# Patient Record
Sex: Male | Born: 1960 | Race: White | Hispanic: No | Marital: Married | Smoking: Never smoker
Health system: Southern US, Community
[De-identification: ages and names within clinical notes are randomized; demographics above are authoritative.]

## PROBLEM LIST (undated history)

## (undated) DIAGNOSIS — G4730 Sleep apnea, unspecified: Secondary | ICD-10-CM

## (undated) DIAGNOSIS — R232 Flushing: Secondary | ICD-10-CM

## (undated) DIAGNOSIS — G8929 Other chronic pain: Secondary | ICD-10-CM

## (undated) DIAGNOSIS — F3289 Other specified depressive episodes: Secondary | ICD-10-CM

## (undated) DIAGNOSIS — G4700 Insomnia, unspecified: Secondary | ICD-10-CM

## (undated) DIAGNOSIS — J019 Acute sinusitis, unspecified: Secondary | ICD-10-CM

## (undated) DIAGNOSIS — M2550 Pain in unspecified joint: Secondary | ICD-10-CM

## (undated) DIAGNOSIS — E785 Hyperlipidemia, unspecified: Secondary | ICD-10-CM

## (undated) DIAGNOSIS — I10 Essential (primary) hypertension: Secondary | ICD-10-CM

## (undated) DIAGNOSIS — M549 Dorsalgia, unspecified: Secondary | ICD-10-CM

## (undated) DIAGNOSIS — E291 Testicular hypofunction: Secondary | ICD-10-CM

## (undated) DIAGNOSIS — R51 Headache: Secondary | ICD-10-CM

## (undated) DIAGNOSIS — M5430 Sciatica, unspecified side: Secondary | ICD-10-CM

## (undated) DIAGNOSIS — D751 Secondary polycythemia: Secondary | ICD-10-CM

## (undated) DIAGNOSIS — M1990 Unspecified osteoarthritis, unspecified site: Secondary | ICD-10-CM

## (undated) DIAGNOSIS — E7800 Pure hypercholesterolemia, unspecified: Secondary | ICD-10-CM

## (undated) DIAGNOSIS — J069 Acute upper respiratory infection, unspecified: Secondary | ICD-10-CM

## (undated) DIAGNOSIS — F329 Major depressive disorder, single episode, unspecified: Secondary | ICD-10-CM

## (undated) DIAGNOSIS — K219 Gastro-esophageal reflux disease without esophagitis: Secondary | ICD-10-CM

## (undated) HISTORY — PX: OTHER SURGICAL HISTORY: SHX169

## (undated) HISTORY — DX: Hyperlipidemia, unspecified: E78.5

## (undated) HISTORY — DX: Essential (primary) hypertension: I10

## (undated) HISTORY — PX: MOUTH SURGERY: SHX715

## (undated) HISTORY — DX: Major depressive disorder, single episode, unspecified: F32.9

## (undated) HISTORY — DX: Acute upper respiratory infection, unspecified: J06.9

## (undated) HISTORY — DX: Sciatica, unspecified side: M54.30

## (undated) HISTORY — DX: Headache: R51

## (undated) HISTORY — DX: Pure hypercholesterolemia, unspecified: E78.00

## (undated) HISTORY — DX: Gastro-esophageal reflux disease without esophagitis: K21.9

## (undated) HISTORY — DX: Secondary polycythemia: D75.1

## (undated) HISTORY — DX: Acute sinusitis, unspecified: J01.90

## (undated) HISTORY — DX: Flushing: R23.2

## (undated) HISTORY — DX: Dorsalgia, unspecified: M54.9

## (undated) HISTORY — DX: Testicular hypofunction: E29.1

## (undated) HISTORY — DX: Other specified depressive episodes: F32.89

## (undated) HISTORY — DX: Insomnia, unspecified: G47.00

## (undated) HISTORY — PX: EYE SURGERY: SHX253

---

## 2005-02-11 HISTORY — PX: OTHER SURGICAL HISTORY: SHX169

## 2009-01-06 ENCOUNTER — Emergency Department (HOSPITAL_COMMUNITY): Admission: EM | Admit: 2009-01-06 | Discharge: 2009-01-06 | Payer: Self-pay | Admitting: Emergency Medicine

## 2009-03-04 ENCOUNTER — Ambulatory Visit (HOSPITAL_COMMUNITY): Admission: RE | Admit: 2009-03-04 | Discharge: 2009-03-04 | Payer: Self-pay | Admitting: Family Medicine

## 2009-05-04 ENCOUNTER — Encounter: Payer: Self-pay | Admitting: Internal Medicine

## 2009-05-11 ENCOUNTER — Ambulatory Visit: Payer: Self-pay | Admitting: Internal Medicine

## 2009-05-11 DIAGNOSIS — E291 Testicular hypofunction: Secondary | ICD-10-CM

## 2009-05-11 DIAGNOSIS — R232 Flushing: Secondary | ICD-10-CM

## 2009-05-11 DIAGNOSIS — J069 Acute upper respiratory infection, unspecified: Secondary | ICD-10-CM

## 2009-05-11 DIAGNOSIS — G8929 Other chronic pain: Secondary | ICD-10-CM

## 2009-05-11 DIAGNOSIS — D751 Secondary polycythemia: Secondary | ICD-10-CM

## 2009-05-11 DIAGNOSIS — M549 Dorsalgia, unspecified: Secondary | ICD-10-CM

## 2009-05-11 DIAGNOSIS — F329 Major depressive disorder, single episode, unspecified: Secondary | ICD-10-CM

## 2009-05-11 DIAGNOSIS — I10 Essential (primary) hypertension: Secondary | ICD-10-CM | POA: Insufficient documentation

## 2009-05-11 DIAGNOSIS — K219 Gastro-esophageal reflux disease without esophagitis: Secondary | ICD-10-CM

## 2009-05-11 DIAGNOSIS — E785 Hyperlipidemia, unspecified: Secondary | ICD-10-CM

## 2009-05-11 HISTORY — DX: Acute upper respiratory infection, unspecified: J06.9

## 2009-05-11 HISTORY — DX: Testicular hypofunction: E29.1

## 2009-05-11 HISTORY — DX: Gastro-esophageal reflux disease without esophagitis: K21.9

## 2009-05-11 HISTORY — DX: Flushing: R23.2

## 2009-05-11 HISTORY — DX: Dorsalgia, unspecified: M54.9

## 2009-05-11 HISTORY — DX: Secondary polycythemia: D75.1

## 2009-05-12 LAB — CONVERTED CEMR LAB
ALT: 24 units/L (ref 0–53)
AST: 27 units/L (ref 0–37)
Albumin: 4.2 g/dL (ref 3.5–5.2)
Alkaline Phosphatase: 57 units/L (ref 39–117)
BUN: 15 mg/dL (ref 6–23)
Basophils Absolute: 0 10*3/uL (ref 0.0–0.1)
Basophils Relative: 0.3 % (ref 0.0–3.0)
Bilirubin, Direct: 0.2 mg/dL (ref 0.0–0.3)
CO2: 34 meq/L — ABNORMAL HIGH (ref 19–32)
Calcium: 9.3 mg/dL (ref 8.4–10.5)
Chloride: 99 meq/L (ref 96–112)
Cholesterol: 211 mg/dL — ABNORMAL HIGH (ref 0–200)
Creatinine, Ser: 1.3 mg/dL (ref 0.4–1.5)
Direct LDL: 158.1 mg/dL
Eosinophils Absolute: 0 10*3/uL (ref 0.0–0.7)
Eosinophils Relative: 0.2 % (ref 0.0–5.0)
GFR calc non Af Amer: 62.43 mL/min (ref >60)
Glucose, Bld: 88 mg/dL (ref 70–99)
HCT: 55.2 % — ABNORMAL HIGH (ref 39.0–52.0)
HDL: 40.3 mg/dL (ref >39.00)
Hemoglobin, Urine: NEGATIVE
Hemoglobin: 18.6 g/dL — ABNORMAL HIGH (ref 13.0–17.0)
Ketones, ur: NEGATIVE mg/dL
Leukocytes, UA: NEGATIVE
Lymphocytes Relative: 11.5 % — ABNORMAL LOW (ref 12.0–46.0)
Lymphs Abs: 0.9 10*3/uL (ref 0.7–4.0)
MCHC: 33.8 g/dL (ref 30.0–36.0)
MCV: 90.6 fL (ref 78.0–100.0)
Monocytes Absolute: 0.3 10*3/uL (ref 0.1–1.0)
Monocytes Relative: 4.5 % (ref 3.0–12.0)
Neutro Abs: 6.5 10*3/uL (ref 1.4–7.7)
Neutrophils Relative %: 83.5 % — ABNORMAL HIGH (ref 43.0–77.0)
Nitrite: NEGATIVE
PSA: 0.56 ng/mL (ref 0.10–4.00)
Platelets: 201 10*3/uL (ref 150.0–400.0)
Potassium: 4.6 meq/L (ref 3.5–5.1)
RBC: 6.09 M/uL — ABNORMAL HIGH (ref 4.22–5.81)
RDW: 13.7 % (ref 11.5–14.6)
Sex Hormone Binding: 31 nmol/L (ref 13–71)
Sodium: 139 meq/L (ref 135–145)
Specific Gravity, Urine: 1.02 (ref 1.000–1.030)
TSH: 2.16 microintl units/mL (ref 0.35–5.50)
Testosterone Free: 378.8 pg/mL — ABNORMAL HIGH (ref 47.0–244.0)
Testosterone-% Free: 2.7 % (ref 1.6–2.9)
Testosterone: 1396.22 ng/dL — ABNORMAL HIGH (ref 350–890)
Total Bilirubin: 0.9 mg/dL (ref 0.3–1.2)
Total CHOL/HDL Ratio: 5
Total Protein: 6.8 g/dL (ref 6.0–8.3)
Triglycerides: 178 mg/dL — ABNORMAL HIGH (ref 0.0–149.0)
Urine Glucose: NEGATIVE mg/dL
Urobilinogen, UA: 1 (ref 0.0–1.0)
VLDL: 35.6 mg/dL (ref 0.0–40.0)
WBC: 7.7 10*3/uL (ref 4.5–10.5)
pH: >=9 (ref 5.0–8.0)

## 2009-05-15 ENCOUNTER — Ambulatory Visit: Payer: Self-pay | Admitting: Internal Medicine

## 2009-05-16 ENCOUNTER — Encounter: Payer: Self-pay | Admitting: Internal Medicine

## 2009-05-17 ENCOUNTER — Encounter: Payer: Self-pay | Admitting: Internal Medicine

## 2009-05-17 LAB — CBC WITH DIFFERENTIAL/PLATELET
BASO%: 0.2 % (ref 0.0–2.0)
Basophils Absolute: 0 10*3/uL (ref 0.0–0.1)
EOS%: 0.9 % (ref 0.0–7.0)
Eosinophils Absolute: 0.1 10*3/uL (ref 0.0–0.5)
HCT: 50.1 % — ABNORMAL HIGH (ref 38.4–49.9)
HGB: 18 g/dL — ABNORMAL HIGH (ref 13.0–17.1)
LYMPH%: 17.1 % (ref 14.0–49.0)
MCH: 31 pg (ref 27.2–33.4)
MCHC: 35.9 g/dL (ref 32.0–36.0)
MCV: 86.2 fL (ref 79.3–98.0)
MONO#: 0.6 10*3/uL (ref 0.1–0.9)
MONO%: 10.8 % (ref 0.0–14.0)
NEUT#: 3.8 10*3/uL (ref 1.5–6.5)
NEUT%: 71 % (ref 39.0–75.0)
Platelets: 164 10*3/uL (ref 140–400)
RBC: 5.81 10*6/uL (ref 4.20–5.82)
RDW: 13.4 % (ref 11.0–14.6)
WBC: 5.4 10*3/uL (ref 4.0–10.3)
lymph#: 0.9 10*3/uL (ref 0.9–3.3)

## 2009-05-18 LAB — COMPREHENSIVE METABOLIC PANEL
ALT: 21 U/L (ref 0–53)
AST: 24 U/L (ref 0–37)
Albumin: 4.5 g/dL (ref 3.5–5.2)
Alkaline Phosphatase: 57 U/L (ref 39–117)
BUN: 20 mg/dL (ref 6–23)
CO2: 22 mEq/L (ref 19–32)
Calcium: 9.3 mg/dL (ref 8.4–10.5)
Chloride: 102 mEq/L (ref 96–112)
Creatinine, Ser: 1.24 mg/dL (ref 0.40–1.50)
Glucose, Bld: 123 mg/dL — ABNORMAL HIGH (ref 70–99)
Potassium: 4.4 mEq/L (ref 3.5–5.3)
Sodium: 138 mEq/L (ref 135–145)
Total Bilirubin: 1.1 mg/dL (ref 0.3–1.2)
Total Protein: 6.3 g/dL (ref 6.0–8.3)

## 2009-05-18 LAB — IRON AND TIBC
%SAT: 77 % — ABNORMAL HIGH (ref 20–55)
Iron: 264 ug/dL — ABNORMAL HIGH (ref 42–165)
TIBC: 341 ug/dL (ref 215–435)
UIBC: 77 ug/dL

## 2009-05-18 LAB — LACTATE DEHYDROGENASE: LDH: 142 U/L (ref 94–250)

## 2009-05-18 LAB — ERYTHROPOIETIN: Erythropoietin: 13.6 m[IU]/mL (ref 2.6–34.0)

## 2009-05-18 LAB — FERRITIN: Ferritin: 45 ng/mL (ref 22–322)

## 2009-05-19 LAB — JAK-2 V617F

## 2009-05-20 LAB — CONVERTED CEMR LAB
Metaneph Total, Ur: 169 ug/24hr — ABNORMAL LOW (ref 182–739)
Metanephrines, Ur: 47 — ABNORMAL LOW (ref 58–203)
Normetanephrine, 24H Ur: 122 (ref 88–649)

## 2009-05-31 ENCOUNTER — Encounter: Payer: Self-pay | Admitting: Internal Medicine

## 2009-05-31 LAB — CBC WITH DIFFERENTIAL/PLATELET
BASO%: 0.2 % (ref 0.0–2.0)
Basophils Absolute: 0 10*3/uL (ref 0.0–0.1)
EOS%: 1.3 % (ref 0.0–7.0)
Eosinophils Absolute: 0.1 10*3/uL (ref 0.0–0.5)
HCT: 46.4 % (ref 38.4–49.9)
HGB: 16.3 g/dL (ref 13.0–17.1)
LYMPH%: 16.9 % (ref 14.0–49.0)
MCH: 31.6 pg (ref 27.2–33.4)
MCHC: 35.1 g/dL (ref 32.0–36.0)
MCV: 89.9 fL (ref 79.3–98.0)
MONO#: 0.5 10*3/uL (ref 0.1–0.9)
MONO%: 5.7 % (ref 0.0–14.0)
NEUT#: 6.2 10*3/uL (ref 1.5–6.5)
NEUT%: 75.9 % — ABNORMAL HIGH (ref 39.0–75.0)
Platelets: 193 10*3/uL (ref 140–400)
RBC: 5.16 10*6/uL (ref 4.20–5.82)
RDW: 14.2 % (ref 11.0–14.6)
WBC: 8.2 10*3/uL (ref 4.0–10.3)
lymph#: 1.4 10*3/uL (ref 0.9–3.3)

## 2009-06-01 LAB — LACTATE DEHYDROGENASE: LDH: 194 U/L (ref 94–250)

## 2009-07-13 ENCOUNTER — Emergency Department (HOSPITAL_COMMUNITY): Admission: EM | Admit: 2009-07-13 | Discharge: 2009-07-13 | Payer: Self-pay | Admitting: Emergency Medicine

## 2009-07-27 ENCOUNTER — Ambulatory Visit: Payer: Self-pay | Admitting: Internal Medicine

## 2009-08-01 ENCOUNTER — Encounter: Admission: RE | Admit: 2009-08-01 | Discharge: 2009-08-01 | Payer: Self-pay | Admitting: *Deleted

## 2009-08-01 LAB — CBC WITH DIFFERENTIAL/PLATELET
BASO%: 0.3 % (ref 0.0–2.0)
Basophils Absolute: 0 10*3/uL (ref 0.0–0.1)
EOS%: 0.6 % (ref 0.0–7.0)
Eosinophils Absolute: 0 10*3/uL (ref 0.0–0.5)
HCT: 51.8 % — ABNORMAL HIGH (ref 38.4–49.9)
HGB: 18.1 g/dL — ABNORMAL HIGH (ref 13.0–17.1)
LYMPH%: 18 % (ref 14.0–49.0)
MCH: 32 pg (ref 27.2–33.4)
MCHC: 34.9 g/dL (ref 32.0–36.0)
MCV: 91.6 fL (ref 79.3–98.0)
MONO#: 0.4 10*3/uL (ref 0.1–0.9)
MONO%: 7.2 % (ref 0.0–14.0)
NEUT#: 4.5 10*3/uL (ref 1.5–6.5)
NEUT%: 73.9 % (ref 39.0–75.0)
Platelets: 175 10*3/uL (ref 140–400)
RBC: 5.66 10*6/uL (ref 4.20–5.82)
RDW: 13.3 % (ref 11.0–14.6)
WBC: 6.1 10*3/uL (ref 4.0–10.3)
lymph#: 1.1 10*3/uL (ref 0.9–3.3)

## 2009-08-05 LAB — FERRITIN: Ferritin: 71 ng/mL (ref 22–322)

## 2009-08-05 LAB — IRON AND TIBC
%SAT: 71 % — ABNORMAL HIGH (ref 20–55)
Iron: 271 ug/dL — ABNORMAL HIGH (ref 42–165)
TIBC: 382 ug/dL (ref 215–435)
UIBC: 111 ug/dL

## 2009-08-05 LAB — HEMOCHROMATOSIS DNA-PCR(C282Y,H63D)

## 2009-08-05 LAB — LACTATE DEHYDROGENASE: LDH: 146 U/L (ref 94–250)

## 2009-09-01 ENCOUNTER — Ambulatory Visit: Payer: Self-pay | Admitting: Internal Medicine

## 2009-09-05 ENCOUNTER — Encounter: Payer: Self-pay | Admitting: Internal Medicine

## 2009-09-05 LAB — CBC WITH DIFFERENTIAL/PLATELET
BASO%: 0.5 % (ref 0.0–2.0)
Basophils Absolute: 0 10*3/uL (ref 0.0–0.1)
EOS%: 0.4 % (ref 0.0–7.0)
Eosinophils Absolute: 0 10*3/uL (ref 0.0–0.5)
HCT: 51.2 % — ABNORMAL HIGH (ref 38.4–49.9)
HGB: 17.8 g/dL — ABNORMAL HIGH (ref 13.0–17.1)
LYMPH%: 14.2 % (ref 14.0–49.0)
MCH: 32.5 pg (ref 27.2–33.4)
MCHC: 34.8 g/dL (ref 32.0–36.0)
MCV: 93.5 fL (ref 79.3–98.0)
MONO#: 0.9 10*3/uL (ref 0.1–0.9)
MONO%: 10.6 % (ref 0.0–14.0)
NEUT#: 6 10*3/uL (ref 1.5–6.5)
NEUT%: 74.3 % (ref 39.0–75.0)
Platelets: 150 10*3/uL (ref 140–400)
RBC: 5.48 10*6/uL (ref 4.20–5.82)
RDW: 14.2 % (ref 11.0–14.6)
WBC: 8 10*3/uL (ref 4.0–10.3)
lymph#: 1.1 10*3/uL (ref 0.9–3.3)

## 2009-09-19 ENCOUNTER — Encounter: Payer: Self-pay | Admitting: Internal Medicine

## 2009-09-19 LAB — CBC WITH DIFFERENTIAL/PLATELET
BASO%: 0.3 % (ref 0.0–2.0)
Basophils Absolute: 0 10*3/uL (ref 0.0–0.1)
EOS%: 0.7 % (ref 0.0–7.0)
Eosinophils Absolute: 0.1 10*3/uL (ref 0.0–0.5)
HCT: 48.2 % (ref 38.4–49.9)
HGB: 16.8 g/dL (ref 13.0–17.1)
LYMPH%: 17.4 % (ref 14.0–49.0)
MCH: 32.5 pg (ref 27.2–33.4)
MCHC: 34.9 g/dL (ref 32.0–36.0)
MCV: 93.2 fL (ref 79.3–98.0)
MONO#: 0.7 10*3/uL (ref 0.1–0.9)
MONO%: 8.1 % (ref 0.0–14.0)
NEUT#: 6.5 10*3/uL (ref 1.5–6.5)
NEUT%: 73.5 % (ref 39.0–75.0)
Platelets: 209 10*3/uL (ref 140–400)
RBC: 5.17 10*6/uL (ref 4.20–5.82)
RDW: 13.8 % (ref 11.0–14.6)
WBC: 8.8 10*3/uL (ref 4.0–10.3)
lymph#: 1.5 10*3/uL (ref 0.9–3.3)

## 2009-09-20 ENCOUNTER — Ambulatory Visit: Payer: Self-pay | Admitting: Internal Medicine

## 2009-09-20 DIAGNOSIS — M5430 Sciatica, unspecified side: Secondary | ICD-10-CM

## 2009-09-20 HISTORY — DX: Sciatica, unspecified side: M54.30

## 2009-09-29 ENCOUNTER — Telehealth: Payer: Self-pay | Admitting: Internal Medicine

## 2009-10-02 ENCOUNTER — Ambulatory Visit: Payer: Self-pay | Admitting: Internal Medicine

## 2009-10-06 ENCOUNTER — Telehealth: Payer: Self-pay | Admitting: Internal Medicine

## 2009-10-10 ENCOUNTER — Ambulatory Visit: Payer: Self-pay | Admitting: Internal Medicine

## 2009-10-10 DIAGNOSIS — G4700 Insomnia, unspecified: Secondary | ICD-10-CM

## 2009-10-10 DIAGNOSIS — J019 Acute sinusitis, unspecified: Secondary | ICD-10-CM

## 2009-10-10 DIAGNOSIS — R519 Headache, unspecified: Secondary | ICD-10-CM | POA: Insufficient documentation

## 2009-10-10 DIAGNOSIS — R51 Headache: Secondary | ICD-10-CM

## 2009-10-10 HISTORY — DX: Acute sinusitis, unspecified: J01.90

## 2009-10-10 HISTORY — DX: Insomnia, unspecified: G47.00

## 2009-10-10 HISTORY — DX: Headache: R51

## 2009-10-17 ENCOUNTER — Ambulatory Visit: Payer: Self-pay | Admitting: Internal Medicine

## 2009-10-24 ENCOUNTER — Ambulatory Visit: Payer: Self-pay | Admitting: Internal Medicine

## 2009-11-15 ENCOUNTER — Ambulatory Visit: Payer: Self-pay | Admitting: Internal Medicine

## 2009-11-16 ENCOUNTER — Encounter: Payer: Self-pay | Admitting: Internal Medicine

## 2009-11-23 ENCOUNTER — Ambulatory Visit (HOSPITAL_COMMUNITY): Admission: RE | Admit: 2009-11-23 | Discharge: 2009-11-23 | Payer: Self-pay | Admitting: Internal Medicine

## 2009-12-11 ENCOUNTER — Encounter: Payer: Self-pay | Admitting: Internal Medicine

## 2010-01-01 ENCOUNTER — Ambulatory Visit: Payer: Self-pay | Admitting: Internal Medicine

## 2010-01-02 ENCOUNTER — Encounter: Payer: Self-pay | Admitting: Internal Medicine

## 2010-01-02 LAB — CBC WITH DIFFERENTIAL/PLATELET
BASO%: 0.3 % (ref 0.0–2.0)
Basophils Absolute: 0 10*3/uL (ref 0.0–0.1)
EOS%: 1.1 % (ref 0.0–7.0)
Eosinophils Absolute: 0.1 10*3/uL (ref 0.0–0.5)
HCT: 41.2 % (ref 38.4–49.9)
HGB: UNDETERMINED g/dL (ref 13.0–17.1)
LYMPH%: 21.2 % (ref 14.0–49.0)
MCH: UNDETERMINED pg (ref 27.2–33.4)
MCHC: UNDETERMINED g/dL (ref 32.0–36.0)
MCV: 84.8 fL (ref 79.3–98.0)
MONO#: 0.4 10*3/uL (ref 0.1–0.9)
MONO%: 5.8 % (ref 0.0–14.0)
NEUT#: 5.3 10*3/uL (ref 1.5–6.5)
NEUT%: 71.6 % (ref 39.0–75.0)
Platelets: 208 10*3/uL (ref 140–400)
RBC: 4.86 10*6/uL (ref 4.20–5.82)
RDW: 12.9 % (ref 11.0–14.6)
WBC: 7.4 10*3/uL (ref 4.0–10.3)
lymph#: 1.6 10*3/uL (ref 0.9–3.3)
nRBC: 0 % (ref 0–0)

## 2010-01-02 LAB — TECHNOLOGIST REVIEW: Technologist Review: UNDETERMINED

## 2010-03-04 ENCOUNTER — Encounter: Payer: Self-pay | Admitting: Orthopedic Surgery

## 2010-03-04 ENCOUNTER — Encounter: Payer: Self-pay | Admitting: Family Medicine

## 2010-03-13 NOTE — Miscellaneous (Signed)
Summary: Orders Update  Clinical Lists Changes  Problems: Added new problem of POLYCYTHEMIA (ICD-289.0) Orders: Added new Referral order of Hematology Referral (Hematology) - Signed 

## 2010-03-13 NOTE — Letter (Signed)
[facility]   Imported By: Sherian Rein 06/06/2009 15:01:40  _____________________________________________________________________  External Attachment:    Type:   Image     Comment:   External Document

## 2010-03-13 NOTE — Assessment & Plan Note (Signed)
Summary: f/u on bp/#/cd   Vital Signs:  Patient profile:   Derrick Kelley Height:      66 inches Weight:      187.25 pounds BMI:     30.33 O2 Sat:      96 % on Room air Temp:     97.9 degrees F oral Pulse rate:   89 / minute BP sitting:   110 / 82  (left arm) Cuff size:   regular  Vitals Entered By: Zella Ball Ewing CMA Duncan Dull) (October 10, 2009 1:54 PM)  O2 Flow:  Room air CC: Followup on BP/RE   CC:  Followup on BP/RE.  History of Present Illness: here with onset x 2 days mild to mod HA right side , throbbing behind the right eye, pulsation;  feels "foggy" and "spacey";  Pt denies other new neuro symptoms such as facial or extremity weakness, blurred vision   has been taking the tribenzor 5/40/25 - 1 per day but unfort had lower BP, better with only HALF pill, but still with headache as above, and also with new onset cramps to the extremities.  Also with 2 days onset ST, ? low grade fever, without cough or Pt denies CP, worsening sob, doe, wheezing, orthopnea, pnd, worsening LE edema, palps, dizziness or syncope   also with recent worsening insomnia with trouble getting to sleep, without worsening depressive symptoms, suicidal ideation, or panic.  Problems Prior to Update: 1)  Headache  (ICD-784.0) 2)  Sinusitis- Acute-nos  (ICD-461.9) 3)  Sciatica, Right  (ICD-724.3) 4)  Polycythemia  (ICD-289.0) 5)  Polycythemia  (ICD-289.0) 6)  Preventive Health Care  (ICD-V70.0) 7)  Hypogonadism  (ICD-257.2) 8)  Uri  (ICD-465.9) 9)  Flushing  (ICD-782.62) 10)  Hypertension  (ICD-401.9) 11)  Hyperlipidemia  (ICD-272.4) 12)  Gerd  (ICD-530.81) 13)  Depression  (ICD-311) 14)  Back Pain, Chronic  (ICD-724.5)  Medications Prior to Update: 1)  Kadian 30 Mg Xr24h-Cap (Morphine Sulfate) .Marland Kitchen.. 1 By Mouth Two Times A Day 2)  Ambien 10 Mg Tabs (Zolpidem Tartrate) .Marland Kitchen.. 1 By Mouth Once Daily As Needed 3)  Testosterone Cypionate 200 Mg/ml Oil (Testosterone Cypionate) .Marland Kitchen.. 1 Cc Biweekly Im (Self  Injection) As Needed 4)  Losartan Potassium 100 Mg Tabs (Losartan Potassium) .Marland Kitchen.. 1po Once Daily 5)  Sertraline Hcl 100 Mg Tabs (Sertraline Hcl) .Marland Kitchen.. 1 and 1/2 By Mouth Once Daily 6)  Amlodipine Besylate 5 Mg Tabs (Amlodipine Besylate) .Marland Kitchen.. 1po Once Daily  Current Medications (verified): 1)  Kadian 30 Mg Xr24h-Cap (Morphine Sulfate) .Marland Kitchen.. 1 By Mouth Two Times A Day 2)  Ambien 10 Mg Tabs (Zolpidem Tartrate) .Marland Kitchen.. 1 By Mouth Once Daily As Needed 3)  Testosterone Cypionate 200 Mg/ml Oil (Testosterone Cypionate) .Marland Kitchen.. 1 Cc Biweekly Im (Self Injection) As Needed 4)  Sertraline Hcl 100 Mg Tabs (Sertraline Hcl) .Marland Kitchen.. 1 and 1/2 By Mouth Once Daily 5)  Azor 5-20 Mg Tabs (Amlodipine-Olmesartan) .Marland Kitchen.. 1po Once Daily 6)  Doxycycline Hyclate 100 Mg Caps (Doxycycline Hyclate) .Marland Kitchen.. 1po Two Times A Day  Allergies (verified): 1)  Wellbutrin  Past History:  Past Medical History: Last updated: 09/20/2009 chronic back pain - heather graham - [facility] pain clinc lumbar disc dz s/p fusion right sciatica - recurrent - [facility]  Depression GERD Hyperlipidemia Hypertension chronic left shoudler pain - dr Ave Filter, [facility] hypogonadism polcythemia due to testosterone tx  - Dr Mohammed/heme  Past Surgical History: Last updated: 09/20/2009 hx of left clavicle fracture hx  of eye surgury 2001 hx of back surgury 2006 - lumbar fusion hx of left shoulder surgury 2002, and August 10, 2009 - Dr Elfredia Nevins  Social History: Last updated: 05/11/2009 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  mild Head:  normocephalic and atraumatic.   Eyes:  vision grossly intact, pupils equal,  pupils round, and normal cup-disc ratio.   Ears:  bilat tm's mild red, sinus  nontender bilat Nose:  nasal dischargemucosal pallor and mucosal edema.   Mouth:  pharyngeal erythema and fair dentition.   Neck:  supple and cervical lymphadenopathy.   Lungs:  normal respiratory effort and normal breath sounds.   Heart:  normal rate and regular rhythm.   Msk:  no joint tenderness and no joint swelling.   Extremities:  no edema, no erythema  Neurologic:  cranial nerves II-XII intact, strength normal in all extremities, sensation intact to light touch, gait normal, and DTRs symmetrical and normal.   Skin:  color normal and no rashes.   Psych:  not depressed appearing and moderately anxious.     Impression & Recommendations:  Problem # 1:  SINUSITIS- ACUTE-NOS (ICD-461.9)  His updated medication list for this problem includes:    Doxycycline Hyclate 100 Mg Caps (Doxycycline hyclate) .Marland Kitchen... 1po two times a day treat as above, f/u any worsening signs or symptoms   Problem # 2:  HEADACHE (ICD-784.0)  His updated medication list for this problem includes:    Kadian 30 Mg Xr24h-cap (Morphine sulfate) .Marland Kitchen... 1 by mouth two times a day ? secondary to above, vs migraine, vs other  - has pain meds to which he is tryin to wean with pain management;  exam o/w normal, will follow for any worsening symptoms   Problem # 3:  HYPERTENSION (ICD-401.9)  The following medications were removed from the medication list:    Losartan Potassium 100 Mg Tabs (Losartan potassium) .Marland Kitchen... 1po once daily His updated medication list for this problem includes:    Azor 5-20 Mg Tabs (Amlodipine-olmesartan) .Marland Kitchen... 1po once daily  BP today: 110/82 Prior BP: 144/98 (10/02/2009)  Labs Reviewed: K+: 4.6 (05/11/2009) Creat: : 1.3 (05/11/2009)   Chol: 211 (05/11/2009)   HDL: 40.30 (05/11/2009)   TG: 178.0 (05/11/2009) meds adjusted as above, though not clear the tribenzor is related to the HA;  BP has been sympt too low per pt   and apparently overcontrolled  Problem # 4:  INSOMNIA-SLEEP DISORDER-UNSPEC (ICD-780.52)  His updated medication list for this problem includes:    Ambien 10 Mg Tabs (Zolpidem tartrate) .Marland Kitchen... 1 by mouth once daily as needed treat as above, f/u any worsening signs or symptoms   Complete Medication List: 1)  Kadian 30 Mg Xr24h-cap (Morphine sulfate) .Marland Kitchen.. 1 by mouth two times a day 2)  Ambien 10 Mg Tabs (Zolpidem tartrate) .Marland Kitchen.. 1 by mouth once daily as needed 3)  Testosterone Cypionate 200 Mg/ml Oil (Testosterone cypionate) .Marland Kitchen.. 1 cc biweekly im (self injection) as needed 4)  Sertraline Hcl 100 Mg Tabs (Sertraline hcl) .Marland Kitchen.. 1 and 1/2 by mouth once daily 5)  Azor 5-20 Mg Tabs (Amlodipine-olmesartan) .Marland Kitchen.. 1po once daily 6)  Doxycycline Hyclate 100 Mg Caps (Doxycycline hyclate) .Marland Kitchen.. 1po two times  a day  Patient Instructions: 1)  Please take all new medications as prescribed 2)  Continue all previous medications as before this visit  3)  Please schedule a follow-up appointment in 2 weeks. Prescriptions: DOXYCYCLINE HYCLATE 100 MG CAPS (DOXYCYCLINE HYCLATE) 1po two times a day  #20 x 0   Entered and Authorized by:   Corwin Levins MD   Signed by:   Corwin Levins MD on 10/10/2009   Method used:   Print then Give to Patient   RxID:   [id number] AMBIEN 10 MG TABS (ZOLPIDEM TARTRATE) 1 by mouth once daily as needed  #30 x 5   Entered and Authorized by:   Corwin Levins MD   Signed by:   Corwin Levins MD on 10/10/2009   Method used:   Print then Give to Patient   RxID:   [id number] AZOR 5-20 MG TABS (AMLODIPINE-OLMESARTAN) 1po once daily  #90 x 3   Entered and Authorized by:   Corwin Levins MD   Signed by:   Corwin Levins MD on 10/10/2009   Method used:   Print then Give to Patient   RxID:   [id number]

## 2010-03-13 NOTE — Assessment & Plan Note (Signed)
Summary: PROBLEMS WITH BP/NWS   Vital Signs:  Patient profile:   50 year old male Height:      66 inches Weight:      189 pounds BMI:     30.62 O2 Sat:      97 % on Room air Temp:     98 degrees F oral Pulse rate:   97 / minute BP sitting:   120 / 80  (left arm) Cuff size:   regular  Vitals Entered By: Zella Ball Ewing CMA Duncan Dull) (November 15, 2009 2:59 PM)  O2 Flow:  Room air CC: BP elevated in the mornings, Headaches daily/RE   CC:  BP elevated in the mornings and Headaches daily/RE.  History of Present Illness: here for f/u;  BP overall improved, but taken several days when first get up in the am and BP always in the 150's;  Pt denies CP, worsening sob, doe, wheezing, orthopnea, pnd, worsening LE edema, palps, dizziness or syncope  Pt denies new neuro symptoms such as new headache, facial or extremity weakness but still with recurrent right side headache as per prior eval/HPI , despite tx for sinus related symtpoms as well which have resolved.  No fever, wt loss, night sweats, loss of appetite or other constitutional symptoms Denies polydipsia, or polyuria. Now off kadian per pain management, and he did notice the HA worsening as the pain med was tapered. Denies worseing depressive symtpoms, suicidal ideation or panic/anxiety  Problems Prior to Update: 1)  Insomnia-sleep Disorder-unspec  (ICD-780.52) 2)  Headache  (ICD-784.0) 3)  Sinusitis- Acute-nos  (ICD-461.9) 4)  Sciatica, Right  (ICD-724.3) 5)  Polycythemia  (ICD-289.0) 6)  Polycythemia  (ICD-289.0) 7)  Preventive Health Care  (ICD-V70.0) 8)  Hypogonadism  (ICD-257.2) 9)  Uri  (ICD-465.9) 10)  Flushing  (ICD-782.62) 11)  Hypertension  (ICD-401.9) 12)  Hyperlipidemia  (ICD-272.4) 13)  Gerd  (ICD-530.81) 14)  Depression  (ICD-311) 15)  Back Pain, Chronic  (ICD-724.5)  Medications Prior to Update: 1)  Kadian 30 Mg Xr24h-Cap (Morphine Sulfate) .Marland Kitchen.. 1 By Mouth Two Times A Day 2)  Ambien 10 Mg Tabs (Zolpidem Tartrate) .Marland Kitchen.. 1  By Mouth Once Daily As Needed 3)  Testosterone Cypionate 200 Mg/ml Oil (Testosterone Cypionate) .Marland Kitchen.. 1 Cc Biweekly Im (Self Injection) As Needed 4)  Sertraline Hcl 100 Mg Tabs (Sertraline Hcl) .Marland Kitchen.. 1 and 1/2 By Mouth Once Daily 5)  Azor 5-20 Mg Tabs (Amlodipine-Olmesartan) .Marland Kitchen.. 1po Once Daily 6)  Doxycycline Hyclate 100 Mg Caps (Doxycycline Hyclate) .Marland Kitchen.. 1po Two Times A Day  Current Medications (verified): 1)  Ambien 10 Mg Tabs (Zolpidem Tartrate) .Marland Kitchen.. 1 By Mouth Once Daily As Needed 2)  Testosterone Cypionate 200 Mg/ml Oil (Testosterone Cypionate) .Marland Kitchen.. 1 Cc Biweekly Im (Self Injection) As Needed 3)  Sertraline Hcl 100 Mg Tabs (Sertraline Hcl) .Marland Kitchen.. 1 and 1/2 By Mouth Once Daily 4)  Azor 10-20 Mg Tabs (Amlodipine-Olmesartan) .Marland Kitchen.. 1 By Mouth Once Daily 5)  Sumatriptan Succinate 100 Mg Tabs (Sumatriptan Succinate) .Marland Kitchen.. 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  - Dr Mohammed/heme  Past Surgical History: Last updated: 09/20/2009 hx of left clavicle fracture  hx of eye surgury 2001 hx of back surgury 2006 - lumbar fusion hx of left shoulder surgury 2002, and August 10, 2009 - Dr Elfredia Nevins  Social History: Last updated: 05/11/2009 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   Head:  normocephalic  and atraumatic.   Eyes:  vision grossly intact, pupils equal, pupils round, and normal cup-disc ratio.   Ears:  R ear normal and L ear normal.   Nose:  no external deformity and no nasal discharge.   Mouth:  no gingival abnormalities and pharynx pink and moist.   Neck:  supple and no masses.   Lungs:  normal respiratory effort and normal breath sounds.   Heart:  normal rate and regular rhythm.   Extremities:  no edema, no erythema  Neurologic:  cranial nerves II-XII intact, strength normal in all extremities, sensation intact to light touch, and gait normal.     Impression & Recommendations:  Problem # 1:  HEADACHE (ICD-784.0)  The following medications were removed from the medication list:    Kadian 30 Mg Xr24h-cap (Morphine sulfate) .Marland Kitchen... 1 by mouth two times a day His updated medication list for this problem includes:    Sumatriptan Succinate 100 Mg Tabs (Sumatriptan succinate) .Marland Kitchen... 1 by mouth once daily as needed ? cluster type - for treat as above, f/u any worsening signs or symptoms , check head MRI, neuro referral  Orders: Radiology Referral (Radiology) Neurology Referral (Neuro)  Problem # 2:  SINUSITIS- ACUTE-NOS (ICD-461.9)  The following medications were removed from the medication list:    Doxycycline Hyclate 100 Mg Caps (Doxycycline hyclate) .Marland Kitchen... 1po two times a day resolved, pt reassured  Problem # 3:  HYPERTENSION (ICD-401.9)  His updated medication list for this problem includes:    Azor 10-20 Mg Tabs (Amlodipine-olmesartan) .Marland Kitchen... 1 by mouth once daily incr med as above - treat as above, f/u any worsening signs or symptoms , follow BP at home and next visit  BP today: 120/80 Prior BP: 110/82 (10/10/2009)  Labs Reviewed: K+: 4.6 (05/11/2009) Creat: : 1.3 (05/11/2009)   Chol: 211 (05/11/2009)   HDL: 40.30 (05/11/2009)   TG: 178.0 (05/11/2009)  Problem # 4:  DEPRESSION (ICD-311)  His updated medication list for this problem includes:    Sertraline Hcl  100 Mg Tabs (Sertraline hcl) .Marland Kitchen... 1 and 1/2 by mouth once daily stable overall by hx and exam, ok to continue meds/tx as is   Complete Medication List: 1)  Ambien 10 Mg Tabs (Zolpidem tartrate) .Marland Kitchen.. 1 by mouth once daily as needed 2)  Testosterone Cypionate 200 Mg/ml Oil (Testosterone cypionate) .Marland Kitchen.. 1 cc biweekly im (self injection) as needed 3)  Sertraline Hcl 100 Mg Tabs (Sertraline hcl) .Marland Kitchen.. 1 and 1/2 by mouth once daily 4)  Azor 10-20 Mg Tabs (Amlodipine-olmesartan) .Marland Kitchen.. 1 by mouth once daily 5)  Sumatriptan Succinate 100 Mg Tabs (Sumatriptan succinate) .Marland Kitchen.. 1 by mouth once daily as needed  Patient Instructions: 1)  change the azor to the 10/20 - 1 per day 2)  You will be contacted about the referral(s) to: MRI for the head, and Neurology 3)  Please take all new medications as prescribed - the generic imitrex 4)  Continue all previous medications as before this visit 5)  Please schedule a follow-up appointment as needed. Prescriptions: SUMATRIPTAN SUCCINATE 100 MG TABS (SUMATRIPTAN SUCCINATE) 1 by mouth once daily as needed  #9 x 11   Entered and Authorized by:   Corwin Levins MD   Signed by:   Corwin Levins MD on 11/15/2009   Method used:   Print then Give to Patient   RxID:   [id number] AZOR 10-20 MG TABS (AMLODIPINE-OLMESARTAN) 1 by mouth once daily  #90 x 3   Entered and Authorized by:   Corwin Levins MD   Signed by:   Corwin Levins MD on 11/15/2009   Method used:   Print then Give to Patient   RxID:   [id number]

## 2010-03-13 NOTE — Assessment & Plan Note (Signed)
Summary: REEVALUATE PT BLOOD PRESSURE RX PER DR. MOHAMED-LB   Vital Signs:  Patient profile:   50 year old male Height:      66 inches Weight:      183 pounds BMI:     29.64 O2 Sat:      98 % on Room air Temp:     98.3 degrees F oral Pulse rate:   82 / minute BP sitting:   132 / 88  (left arm) Cuff size:   regular  Vitals Entered By: Zella Ball Ewing CMA Duncan Dull) (September 20, 2009 9:21 AM)  O2 Flow:  Room air CC: BP elevated for 3 days/RE   CC:  BP elevated for 3 days/RE.  History of Present Illness:  BP in the past wk on several occasiona, and the pain clinic has been approx 150/100; Pt denies CP, sob, doe, wheezing, orthopnea, pnd, worsening LE edema, palps, dizziness or syncope  ;Pt denies new neuro symptoms such as headache, facial or extremity weakness   Sees Dr Shirline Frees /heme with elev rbc thought due to too freq testosterone - now doing this at 2 wks.  No fever, wt loss, night sweats, loss of appetite or other constitutional symptoms   Still out on disability with back pain occurred on the job - last worked approx mid june.  Does not think he will get back to same trade;  Overall good compliacne with meds and tolerability including cozaar med and lower salt diet.  Wt overall stable.  Not as active since not owrking.  No increased ETOH.  Does have midl worsening depressive symtpoms not working but no suicidal ideation.  No panic.   Problems Prior to Update: 1)  Polycythemia  (ICD-289.0) 2)  Preventive Health Care  (ICD-V70.0) 3)  Hypogonadism  (ICD-257.2) 4)  Uri  (ICD-465.9) 5)  Flushing  (ICD-782.62) 6)  Hypertension  (ICD-401.9) 7)  Hyperlipidemia  (ICD-272.4) 8)  Gerd  (ICD-530.81) 9)  Depression  (ICD-311) 10)  Back Pain, Chronic  (ICD-724.5)  Medications Prior to Update: 1)  Oxycodone Hcl 10 Mg Tabs (Oxycodone Hcl) .Marland Kitchen.. 1 By Mouth Three Times A Day As Needed 2)  Kadian 50 Mg Xr24h-Cap (Morphine Sulfate) .Marland Kitchen.. 1 By Mouth Two Times A Day 3)  Ambien 10 Mg Tabs (Zolpidem  Tartrate) .Marland Kitchen.. 1 By Mouth Once Daily As Needed 4)  Testosterone Cypionate 200 Mg/ml Oil (Testosterone Cypionate) .Marland Kitchen.. 1 Cc Weekly Im (Self Injection) 5)  Losartan Potassium 100 Mg Tabs (Losartan Potassium) .Marland Kitchen.. 1po Once Daily 6)  Azithromycin 250 Mg Tabs (Azithromycin) .... 2po Qd For 1 Day, Then 1po Qd For 4days, Then Stop  Current Medications (verified): 1)  Oxycodone Hcl 10 Mg Tabs (Oxycodone Hcl) .Marland Kitchen.. 1 By Mouth Three Times A Day As Needed 2)  Kadian 50 Mg Xr24h-Cap (Morphine Sulfate) .Marland Kitchen.. 1 By Mouth Two Times A Day 3)  Ambien 10 Mg Tabs (Zolpidem Tartrate) .Marland Kitchen.. 1 By Mouth Once Daily As Needed 4)  Testosterone Cypionate 200 Mg/ml Oil (Testosterone Cypionate) .Marland Kitchen.. 1 Cc Biweekly Im (Self Injection) 5)  Losartan Potassium 100 Mg Tabs (Losartan Potassium) .Marland Kitchen.. 1po Once Daily 6)  Sertraline Hcl 100 Mg Tabs (Sertraline Hcl) .Marland Kitchen.. 1 and 1/2 By Mouth Once Daily 7)  Amlodipine Besylate 5 Mg Tabs (Amlodipine Besylate) .Marland Kitchen.. 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, former cocaine,  none for years  Risk Factors: Smoking Status: never (05/11/2009)  Past Medical History: chronic back pain - heather graham - [facility] pain clinc lumbar disc dz s/p fusion right sciatica - recurrent - [facility]  Depression GERD Hyperlipidemia Hypertension chronic left shoudler pain - dr Ave Filter, [facility] hypogonadism 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   Head:  normocephalic and atraumatic.   Eyes:  vision grossly intact, pupils equal, and pupils round.   Ears:  R ear normal and L ear normal.   Nose:  no external deformity and no nasal discharge.   Mouth:  no gingival abnormalities and pharynx pink and moist.   Neck:  supple and no masses.   Lungs:  normal respiratory effort and normal breath sounds.   Heart:  normal rate and regular rhythm.   Extremities:  no edema, no erythema    Impression & Recommendations:  Problem # 1:  HYPERTENSION (ICD-401.9)  His updated medication list for this problem includes:    Losartan Potassium 100 Mg Tabs (Losartan potassium) .Marland Kitchen... 1po once daily    Amlodipine Besylate 5 Mg Tabs (Amlodipine besylate) .Marland Kitchen... 1po once daily  BP today: 132/88 Prior BP: 148/82 (05/11/2009)  Labs Reviewed: K+: 4.6 (05/11/2009) Creat: : 1.3 (05/11/2009)   Chol: 211 (05/11/2009)   HDL: 40.30 (05/11/2009)   TG: 178.0 (05/11/2009) to add the amlodipne 5 mg per day; f/.u BP at home and heme as well   Problem # 2:  HYPERLIPIDEMIA (ICD-272.4)  Labs Reviewed: SGOT: 27 (05/11/2009)   SGPT: 24 (05/11/2009)   HDL:40.30 (05/11/2009)  Chol:211 (05/11/2009)  Trig:178.0 (05/11/2009) d/w pt - to cont diet, consider  statin if not improved next visit  Problem # 3:  HYPOGONADISM (ICD-257.2) stable overall by hx and exam, ok to continue meds/tx as is, to check testosterone next viist  Problem # 4:  DEPRESSION (ICD-311)  His updated medication list for this problem includes:    Sertraline Hcl 100 Mg Tabs (Sertraline hcl) .Marland Kitchen... 1 and 1/2 by mouth once daily to incr the sertaline as above for mild uncontrolled symtpolms;    Complete Medication List: 1)  Oxycodone Hcl 10 Mg Tabs (Oxycodone hcl) .Marland Kitchen.. 1 by mouth three times a day as needed 2)  Kadian 50 Mg Xr24h-cap (Morphine sulfate) .Marland Kitchen.. 1 by mouth two times a day 3)  Ambien 10 Mg Tabs (Zolpidem tartrate) .Marland Kitchen.. 1 by mouth once daily as needed 4)  Testosterone  Cypionate 200 Mg/ml Oil (Testosterone cypionate) .Marland Kitchen.. 1 cc biweekly im (self injection) 5)  Losartan Potassium 100 Mg Tabs (Losartan potassium) .Marland Kitchen.. 1po once daily 6)  Sertraline Hcl 100 Mg Tabs (Sertraline hcl) .Marland Kitchen.. 1 and 1/2 by mouth once daily 7)  Amlodipine Besylate 5 Mg Tabs (Amlodipine besylate) .Marland Kitchen.. 1po once daily  Patient Instructions: 1)  start the amlodipine 5 mg per day 2)  increase the sertraline to 1 and 1/2 pills of the 100 mg strength 3)  Continue all previous medications as before this visit  4)  Please keep your folllowup appts with Dr Arbutus Ped 5)  Please schedule a follow-up appointment in  6 months. with CPX labs and: 6)  total testosterone  [phone] 7)  Check your Blood Pressure regularly. If it is above 140/90: you should make an appointment sooner Prescriptions: SERTRALINE HCL 100 MG TABS (SERTRALINE HCL) 1 and 1/2 by mouth once daily  #45 x 11   Entered and Authorized by:   Corwin Levins MD   Signed by:   Corwin Levins MD on 09/20/2009   Method used:   Print then Give to Patient   RxID:   [id number] AMLODIPINE BESYLATE 5 MG TABS (AMLODIPINE BESYLATE) 1po once daily  #90 x 3   Entered and Authorized by:   Corwin Levins MD   Signed by:   Corwin Levins MD on 09/20/2009   Method used:   Print then Give to Patient   RxID:   [id number]

## 2010-03-13 NOTE — Progress Notes (Signed)
Summary: Med problem  Phone Note Call from Patient Call back at [phone]   Summary of Call: Patient was changed to tribenzor at last office visit. Pt c/o dizzyness & lightheadedness since being on medication. Should pt continue med?  Initial call taken by: Lamar Sprinkles, CMA,  October 06, 2009 12:00 PM  Follow-up for Phone Call        is he able to check BP today at home, at a drug store, or here?  If the BP is not low, then it is very unlikely to be the medication  on the other hand, if he is not able to check his BP at all today, he should take HALF of the tribenzor medication over the weekend and see [place] next able appt for next wk Follow-up by: Corwin Levins MD,  October 06, 2009 1:21 PM  Additional Follow-up for Phone Call Additional follow up Details #1::        multiple attempts, # busy. Margaret Pyle, CMA  October 06, 2009 3:34 PM  multiple attempts this morning, # busy. Margaret Pyle, CMA  October 09, 2009 11:26 AM     Additional Follow-up for Phone Call Additional follow up Details #2::    called pt and informed of above information. Pt stated over the weekend he stopped the medication for one day and BP went up. Since then has taken only 1/2  per day, BP is better but pt. feels dizzy when he stands. I informed pt. to schedule a followup with Dr. Jonny Ruiz and he did so. Follow-up by: Zella Ball Ewing CMA Duncan Dull),  October 09, 2009 2:01 PM

## 2010-03-13 NOTE — Progress Notes (Signed)
Summary: Elevated BP - John pt  Phone Note Call from Patient Call back at [facility] Phone [phone]   Caller: Patient Call For: Dr Jonny Ruiz Summary of Call: Pt left message on triage B. Pt states he started new bp medicine,Amlodipine-5mg . His blood pressure remains elevated. Pt wants to talk to a nurse.  Initial call taken by: Verdell Face,  September 29, 2009 10:42 AM  Follow-up for Phone Call        Pt takes amlodipine 5mg  one once daily and losartan 100mg  one once daily. Today bp 145/96, pulse 98- which he says is no change since starting amlodipine. Please advise.  Follow-up by: Lamar Sprinkles, CMA,  September 29, 2009 11:22 AM  Additional Follow-up for Phone Call Additional follow up Details #1::        needs to be seen Additional Follow-up by: Etta Grandchild MD,  September 29, 2009 11:37 AM    Additional Follow-up for Phone Call Additional follow up Details #2::    Pt checked bp again - 138/86, he declined office visit today and will continue to monitor. Scheduled f/u next week Follow-up by: Lamar Sprinkles, CMA,  September 29, 2009 11:46 AM

## 2010-03-13 NOTE — Assessment & Plan Note (Signed)
Summary: new / bcbs / # / cd   Vital Signs:  Patient profile:   50 year old male Height:      70 inches Weight:      185.50 pounds BMI:     26.71 O2 Sat:      97 % on Room air Temp:     97 degrees F oral Pulse rate:   74 / minute BP sitting:   148 / 82  (left arm) Cuff size:   regular  Vitals Entered ByZella Ball Ewing (May 11, 2009 9:36 AM)  O2 Flow:  Room air  CC: New Pt. New BCBS/RE   CC:  New Pt. New BCBS/RE.  History of Present Illness: here to establish, gets flushing and headaches and elev BP;  BP at home has been regularly mild elev but pt states he has been seen per prior primary MD who did not address it, even though pt mentioned it.  also incidently with recurring left ear ringing worse as the day goes on with headache and ST for 3 days;  also with ongoing fatigue and 3 to 4 days daytime somolence with some nausea today and low grade temp (all very unusual for him).  Has ongoing night sweats for years;  and started zoloft for depression just 2 wks ago (had prior wellbutrin use but had nightmares).  Pt denies CP, sob, doe, wheezing, orthopnea, pnd, worsening LE edema, palps, dizziness or syncope   Pt denies new neuro symptoms such as headache, facial or extremity weakness   Preventive Screening-Counseling & Management  Alcohol-Tobacco     Smoking Status: never      Drug Use:  no.    Problems Prior to Update: 1)  Preventive Health Care  (ICD-V70.0) 2)  Hypogonadism  (ICD-257.2) 3)  Uri  (ICD-465.9) 4)  Flushing  (ICD-782.62) 5)  Hypertension  (ICD-401.9) 6)  Hyperlipidemia  (ICD-272.4) 7)  Gerd  (ICD-530.81) 8)  Depression  (ICD-311) 9)  Back Pain, Chronic  (ICD-724.5)  Medications Prior to Update: 1)  None  Current Medications (verified): 1)  Oxycodone Hcl 10 Mg Tabs (Oxycodone Hcl) .Marland Kitchen.. 1 By Mouth Three Times A Day As Needed 2)  Kadian 50 Mg Xr24h-Cap (Morphine Sulfate) .Marland Kitchen.. 1 By Mouth Two Times A Day 3)  Ambien 10 Mg Tabs (Zolpidem Tartrate) .Marland Kitchen.. 1 By  Mouth Once Daily As Needed 4)  Testosterone Cypionate 200 Mg/ml Oil (Testosterone Cypionate) .Marland Kitchen.. 1 Cc Weekly Im (Self Injection) 5)  Losartan Potassium 100 Mg Tabs (Losartan Potassium) .Marland Kitchen.. 1po Once Daily 6)  Azithromycin 250 Mg Tabs (Azithromycin) .... 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 father with ETOH, as well as uncle, aunt, and grandfather father and uncle with prostate cancer father with HTN m-grandmother with overy cancer  Social History: Reviewed history and no changes required. 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         all otherwise negative per pt -    Physical Exam  General:  alert and well-developed.   Head:  normocephalic and atraumatic.   Eyes:  vision grossly intact, pupils equal, and pupils round.   Ears:  bilat tm's mld red, sinus nontender Nose:  nasal dischargemucosal pallor and mucosal edema.   Mouth:  pharyngeal erythema and fair dentition.   Neck:  supple and cervical lymphadenopathy.   Lungs:  normal respiratory effort and normal breath sounds.   Heart:  normal rate and regular rhythm.   Abdomen:  soft, non-tender, and normal bowel sounds.   Msk:  no joint tenderness and no joint swelling.  , has mild lower mid lumbar area tender without sweling or rash Extremities:  no edema, no erythema  Neurologic:  cranial nerves II-XII intact and strength normal in all extremities.     Impression & Recommendations:  Problem # 1:  Preventive Health Care (ICD-V70.0)  Overall doing well, age appropriate education and counseling updated and referral for appropriate preventive services done unless declined, immunizations up to date or declined, diet counseling done if overweight, urged to quit smoking if smokes , most recent labs reviewed and current ordered if appropriate, ecg reviewed or declined (interpretation per ECG scanned in the EMR if done); information regarding Medicare Prevention requirements given if appropriate   Orders: TLB-BMP (Basic Metabolic Panel-BMET) (80048-METABOL) TLB-CBC Platelet - w/Differential (85025-CBCD) TLB-Hepatic/Liver Function Pnl (80076-HEPATIC) TLB-Lipid Panel (80061-LIPID) TLB-TSH (Thyroid Stimulating Hormone) (84443-TSH) TLB-PSA (Prostate Specific Antigen) (84153-PSA) TLB-Udip ONLY (81003-UDIP) EKG w/ Interpretation (93000)  Problem # 2:  FLUSHING (ICD-782.62)  needs r/o pheo -  for labs  Orders: T-Urine 24 Hr. Catecholamines [id number]) T-Urine 24 Hr. Metanephrines [id number])  Problem # 3:  HYPERTENSION (ICD-401.9)  His updated medication list for this problem includes:    Losartan Potassium 100 Mg Tabs (Losartan potassium) .Marland Kitchen... 1po once daily  BP today: 148/82 start tx as above, f/u BP at home closely as he does  Problem # 4:  HYPERLIPIDEMIA (ICD-272.4) cont diet for now, goall ldl < 100 ; check labs with above  Problem # 5:  URI (ICD-465.9) for zpack today  Problem # 6:  DEPRESSION (ICD-311) to cont the zoloft for now as is  Problem # 7:  GERD (ICD-530.81) since 50 yo, asympt most recently; less with much less ETOH recent  Complete Medication List: 1)  Oxycodone Hcl 10 Mg Tabs (Oxycodone hcl) .Marland Kitchen.. 1 by mouth three times a day as needed 2)  Kadian 50 Mg Xr24h-cap (Morphine sulfate) .Marland Kitchen.. 1 by mouth two times a day 3)  Ambien 10 Mg Tabs (Zolpidem tartrate) .Marland Kitchen.. 1 by mouth once daily as needed 4)  Testosterone Cypionate 200 Mg/ml Oil (Testosterone cypionate) .Marland Kitchen.. 1 cc weekly im (self injection) 5)  Losartan Potassium 100 Mg Tabs (Losartan potassium) .Marland Kitchen.. 1po once daily 6)  Azithromycin 250 Mg Tabs (Azithromycin) .... 2po qd for 1 day, then 1po qd for 4days, then stop  Other Orders: T-Testosterone, Free and Total [id number])  Patient Instructions: 1)  you had tetanus shot today 2)  Please go to the Lab in the basement for your blood and/or urine tests today 3)  please take the antibiotic for the upper respiratory infection 4)  you are given the work note today 5)  start the losartan 100 mg per day for the blood pressure 6)  Please schedule a follow-up appointment in 1 year. 7)  Check your Blood Pressure regularly. If it is above 140/90: you should make an appointment sooner  Prescriptions: LOSARTAN POTASSIUM 100 MG TABS (LOSARTAN POTASSIUM) 1po once daily  #90 x 3   Entered and Authorized by:   Corwin Levins MD   Signed by:   Corwin Levins MD on 05/11/2009   Method used:   Print then Give to Patient   RxID:   [id number] AZITHROMYCIN 250 MG TABS (AZITHROMYCIN) 2po qd for 1 day, then 1po qd for 4days, then stop  #6 x 1   Entered and Authorized by:   Corwin Levins MD   Signed by:   Corwin Levins MD on 05/11/2009   Method used:   Print then Give to Patient   RxID:   [id number] HCWCBJSE POTASSIUM 100 MG TABS (LOSARTAN POTASSIUM) 1po once daily  #30 x 11   Entered and Authorized by:   Corwin Levins MD   Signed by:   Corwin Levins MD on 05/11/2009   Method used:   Print then Give to Patient   RxID:   [id number]

## 2010-03-13 NOTE — Letter (Signed)
[facility]   Imported By: Sherian Rein 06/20/2009 14:16:18  _____________________________________________________________________  External Attachment:    Type:   Image     Comment:   External Document

## 2010-03-13 NOTE — Consult Note (Signed)
Summary: [facility]  [facility]   Imported By: Lennie Odor 11/27/2009 15:42:55  _____________________________________________________________________  External Attachment:    Type:   Image     Comment:   External Document

## 2010-03-13 NOTE — Letter (Signed)
[facility]   Imported By: Sherian Rein 10/04/2009 14:30:22  _____________________________________________________________________  External Attachment:    Type:   Image     Comment:   External Document

## 2010-03-13 NOTE — Letter (Signed)
Summary: [facility]  [facility]   Imported By: Lester [facility]. Florian 12/19/2009 08:07:58  _____________________________________________________________________  External Attachment:    Type:   Image     Comment:   External Document

## 2010-03-13 NOTE — Letter (Signed)
[facility]   Imported By: Lennie Odor 09/19/2009 15:47:25  _____________________________________________________________________  External Attachment:    Type:   Image     Comment:   External Document

## 2010-03-13 NOTE — Assessment & Plan Note (Signed)
Summary: elevated bp/SD   Vital Signs:  Patient profile:   50 year old male Height:      68 inches Weight:      185 pounds BMI:     28.23 O2 Sat:      97 % on Room air Temp:     97.8 degrees F oral Pulse rate:   88 / minute BP sitting:   144 / 98  (left arm) Cuff size:   regular  Vitals Entered By: Zella Ball Ewing CMA (AAMA) (October 02, 2009 11:12 AM)  O2 Flow:  Room air  CC: Elevated BP, headaches, blurred vision/RE   CC:  Elevated BP, headaches, and blurred vision/RE.  History of Present Illness: here to f/u - now off the oxycodone, and down on the kadian from 60 two times a day to 30 two times a day , and trying to wean off further  - followed per pain clinic.  Pt denies CP, worsening sob, doe, wheezing, orthopnea, pnd, worsening LE edema, palps, dizziness or syncope  Pt denies new neuro symptoms such as  facial or extremity weakness but does have significnat headaches daily that seems worse occipital without dizziness.    Problems Prior to Update: 1)  Sciatica, Right  (ICD-724.3) 2)  Polycythemia  (ICD-289.0) 3)  Polycythemia  (ICD-289.0) 4)  Preventive Health Care  (ICD-V70.0) 5)  Hypogonadism  (ICD-257.2) 6)  Uri  (ICD-465.9) 7)  Flushing  (ICD-782.62) 8)  Hypertension  (ICD-401.9) 9)  Hyperlipidemia  (ICD-272.4) 10)  Gerd  (ICD-530.81) 11)  Depression  (ICD-311) 12)  Back Pain, Chronic  (ICD-724.5)  Medications Prior to Update: 1)  Oxycodone Hcl 10 Mg Tabs (Oxycodone Hcl) .Marland Kitchen.. 1 By Mouth Three Times A Day As Needed 2)  Kadian 50 Mg Xr24h-Cap (Morphine Sulfate) .Marland Kitchen.. 1 By Mouth Two Times A Day 3)  Ambien 10 Mg Tabs (Zolpidem Tartrate) .Marland Kitchen.. 1 By Mouth Once Daily As Needed 4)  Testosterone Cypionate 200 Mg/ml Oil (Testosterone Cypionate) .Marland Kitchen.. 1 Cc Biweekly Im (Self Injection) 5)  Losartan Potassium 100 Mg Tabs (Losartan Potassium) .Marland Kitchen.. 1po Once Daily 6)  Sertraline Hcl 100 Mg Tabs (Sertraline Hcl) .Marland Kitchen.. 1 and 1/2 By Mouth Once Daily 7)  Amlodipine Besylate 5 Mg Tabs  (Amlodipine Besylate) .Marland Kitchen.. 1po Once Daily  Current Medications (verified): 1)  Kadian 30 Mg Xr24h-Cap (Morphine Sulfate) .Marland Kitchen.. 1 By Mouth Two Times A Day 2)  Ambien 10 Mg Tabs (Zolpidem Tartrate) .Marland Kitchen.. 1 By Mouth Once Daily As Needed 3)  Testosterone Cypionate 200 Mg/ml Oil (Testosterone Cypionate) .Marland Kitchen.. 1 Cc Biweekly Im (Self Injection) As Needed 4)  Losartan Potassium 100 Mg Tabs (Losartan Potassium) .Marland Kitchen.. 1po Once Daily 5)  Sertraline Hcl 100 Mg Tabs (Sertraline Hcl) .Marland Kitchen.. 1 and 1/2 By Mouth Once Daily 6)  Amlodipine Besylate 5 Mg Tabs (Amlodipine Besylate) .Marland Kitchen.. 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  - Dr Mohammed/heme  Past Surgical History: Last updated: 09/20/2009 hx of left clavicle fracture hx of eye surgury 2001 hx of back surgury 2006 - lumbar fusion hx of left shoulder surgury 2002, and August 10, 2009 - Dr Elfredia Nevins  Social History: Last updated: 05/11/2009 Married 2  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   Head:  normocephalic and atraumatic.   Eyes:  vision grossly intact, pupils equal, and pupils round.   Ears:  R ear normal and L ear normal.   Nose:  no external deformity and no nasal discharge.   Mouth:  no gingival abnormalities and pharynx pink and moist.   Neck:  supple and no masses.     Lungs:  normal respiratory effort and normal breath sounds.   Heart:  normal rate and regular rhythm.   Extremities:  no edema, no erythema  Neurologic:  cranial nerves II-XII intact and strength normal in all extremities.   Psych:  slightly anxious.     Impression & Recommendations:  Problem # 1:  HYPERTENSION (ICD-401.9)  His updated medication list for this problem includes:    Losartan Potassium 100 Mg Tabs (Losartan potassium) .Marland Kitchen... 1po once daily    Amlodipine Besylate 5 Mg Tabs (Amlodipine besylate) .Marland Kitchen... 1po once daily treat as above, f/u any worsening signs or symptoms , check BP at next visit and has a work exam in 2 wks as well   Complete Medication List: 1)  Kadian 30 Mg Xr24h-cap (Morphine sulfate) .Marland Kitchen.. 1 by mouth two times a day 2)  Ambien 10 Mg Tabs (Zolpidem tartrate) .Marland Kitchen.. 1 by mouth once daily as needed 3)  Testosterone Cypionate 200 Mg/ml Oil (Testosterone cypionate) .Marland Kitchen.. 1 cc biweekly im (self injection) as needed 4)  Losartan Potassium 100 Mg Tabs (Losartan potassium) .Marland Kitchen.. 1po once daily 5)  Sertraline Hcl 100 Mg Tabs (Sertraline hcl) .Marland Kitchen.. 1 and 1/2 by mouth once daily 6)  Amlodipine Besylate 5 Mg Tabs (Amlodipine besylate) .Marland Kitchen.. 1po once daily  Patient Instructions: 1)  stop the losartan and the amlodipine 2)  start the Tribenzor as we discussed - 1 per day 3)  Continue all previous medications as before this visit  4)  Please schedule a follow-up appointment in 1 month.

## 2010-03-15 NOTE — Letter (Signed)
Summary: [facility]  [facility]   Imported By: Lester  01/24/2010 09:21:20  _____________________________________________________________________  External Attachment:    Type:   Image     Comment:   External Document

## 2010-03-19 ENCOUNTER — Telehealth: Payer: Self-pay | Admitting: Internal Medicine

## 2010-03-21 ENCOUNTER — Other Ambulatory Visit: Payer: Self-pay

## 2010-03-28 ENCOUNTER — Ambulatory Visit: Payer: Self-pay | Admitting: Internal Medicine

## 2010-03-29 NOTE — Progress Notes (Signed)
Summary: Rx refill req  Phone Note Refill Request Message from:  Fax from Pharmacy on March 19, 2010 10:07 AM  Refills Requested: Medication #1:  AMBIEN 10 MG TABS 1 by mouth once daily as needed   Dosage confirmed as above?Dosage Confirmed   Supply Requested: 3 months   Notes: fax [phone]  Method Requested: Fax to Local Pharmacy Initial call taken by: Margaret Pyle, CMA,  March 19, 2010 10:07 AM    Prescriptions: AMBIEN 10 MG TABS (ZOLPIDEM TARTRATE) 1 by mouth once daily as needed  #30 x 5   Entered and Authorized by:   Corwin Levins MD   Signed by:   Corwin Levins MD on 03/19/2010   Method used:   Print then Give to Patient   RxID:   [id number]  done hardcopy to [facility] side B - dahlia Corwin Levins MD  March 19, 2010 11:41 AM   Rx faxed to pharmacy Margaret Pyle, CMA  March 19, 2010 11:47 AM

## 2010-05-16 LAB — COMPREHENSIVE METABOLIC PANEL
ALT: 16 U/L (ref 0–53)
AST: 20 U/L (ref 0–37)
Albumin: 3.6 g/dL (ref 3.5–5.2)
Alkaline Phosphatase: 41 U/L (ref 39–117)
BUN: 16 mg/dL (ref 6–23)
CO2: 28 mEq/L (ref 19–32)
Calcium: 8.5 mg/dL (ref 8.4–10.5)
Chloride: 105 mEq/L (ref 96–112)
Creatinine, Ser: 1.21 mg/dL (ref 0.4–1.5)
GFR calc Af Amer: >60 mL/min (ref >60)
GFR calc non Af Amer: >60 mL/min (ref >60)
Glucose, Bld: 93 mg/dL (ref 70–99)
Potassium: 4.1 mEq/L (ref 3.5–5.1)
Sodium: 138 mEq/L (ref 135–145)
Total Bilirubin: 0.8 mg/dL (ref 0.3–1.2)
Total Protein: 5.8 g/dL — ABNORMAL LOW (ref 6.0–8.3)

## 2010-05-16 LAB — CBC
HCT: 45.6 % (ref 39.0–52.0)
Hemoglobin: 15.6 g/dL (ref 13.0–17.0)
MCHC: 34.3 g/dL (ref 30.0–36.0)
MCV: 93 fL (ref 78.0–100.0)
Platelets: 162 10*3/uL (ref 150–400)
RBC: 4.91 MIL/uL (ref 4.22–5.81)
RDW: 12.7 % (ref 11.5–15.5)
WBC: 5.7 10*3/uL (ref 4.0–10.5)

## 2010-05-16 LAB — DIFFERENTIAL
Basophils Absolute: 0 10*3/uL (ref 0.0–0.1)
Basophils Relative: 0 % (ref 0–1)
Eosinophils Absolute: 0 10*3/uL (ref 0.0–0.7)
Eosinophils Relative: 1 % (ref 0–5)
Lymphocytes Relative: 19 % (ref 12–46)
Lymphs Abs: 1.1 10*3/uL (ref 0.7–4.0)
Monocytes Absolute: 0.4 10*3/uL (ref 0.1–1.0)
Monocytes Relative: 7 % (ref 3–12)
Neutro Abs: 4.1 10*3/uL (ref 1.7–7.7)
Neutrophils Relative %: 72 % (ref 43–77)

## 2010-05-16 LAB — URINALYSIS, ROUTINE W REFLEX MICROSCOPIC
Glucose, UA: NEGATIVE mg/dL
Hgb urine dipstick: NEGATIVE
Nitrite: NEGATIVE
Protein, ur: NEGATIVE mg/dL
Specific Gravity, Urine: 1.031 — ABNORMAL HIGH (ref 1.005–1.030)
Urobilinogen, UA: 0.2 mg/dL (ref 0.0–1.0)
pH: 6 (ref 5.0–8.0)

## 2010-05-16 LAB — LIPASE, BLOOD: Lipase: 28 U/L (ref 11–59)

## 2010-09-12 ENCOUNTER — Ambulatory Visit: Payer: Self-pay | Admitting: Internal Medicine

## 2010-09-12 ENCOUNTER — Encounter: Payer: Self-pay | Admitting: Internal Medicine

## 2010-09-12 DIAGNOSIS — Z0000 Encounter for general adult medical examination without abnormal findings: Secondary | ICD-10-CM | POA: Insufficient documentation

## 2010-09-13 ENCOUNTER — Encounter: Payer: Self-pay | Admitting: Internal Medicine

## 2010-09-13 ENCOUNTER — Ambulatory Visit (INDEPENDENT_AMBULATORY_CARE_PROVIDER_SITE_OTHER): Payer: BC Managed Care – PPO | Admitting: Internal Medicine

## 2010-09-13 ENCOUNTER — Other Ambulatory Visit: Payer: Self-pay | Admitting: Internal Medicine

## 2010-09-13 ENCOUNTER — Ambulatory Visit (INDEPENDENT_AMBULATORY_CARE_PROVIDER_SITE_OTHER)
Admission: RE | Admit: 2010-09-13 | Discharge: 2010-09-13 | Disposition: A | Discharge status: Home / Self Care | Payer: BC Managed Care – PPO | Source: Ambulatory Visit | Attending: Internal Medicine | Admitting: Internal Medicine

## 2010-09-13 ENCOUNTER — Other Ambulatory Visit (INDEPENDENT_AMBULATORY_CARE_PROVIDER_SITE_OTHER): Payer: BC Managed Care – PPO

## 2010-09-13 VITALS — BP 122/80 | HR 70 | Temp 97.4°F | Ht 68.0 in | Wt 190.2 lb

## 2010-09-13 DIAGNOSIS — Z0000 Encounter for general adult medical examination without abnormal findings: Secondary | ICD-10-CM

## 2010-09-13 DIAGNOSIS — R079 Chest pain, unspecified: Secondary | ICD-10-CM | POA: Insufficient documentation

## 2010-09-13 DIAGNOSIS — E291 Testicular hypofunction: Secondary | ICD-10-CM

## 2010-09-13 LAB — CBC WITH DIFFERENTIAL/PLATELET
Basophils Relative: 0.3 % (ref 0.0–3.0)
Eosinophils Absolute: 0.1 10*3/uL (ref 0.0–0.7)
Eosinophils Relative: 1.2 % (ref 0.0–5.0)
Hemoglobin: 17 g/dL (ref 13.0–17.0)
Lymphocytes Relative: 15.8 % (ref 12.0–46.0)
MCHC: 34.3 g/dL (ref 30.0–36.0)
Neutro Abs: 4.9 10*3/uL (ref 1.4–7.7)
Neutrophils Relative %: 74.9 % (ref 43.0–77.0)
RBC: 5.58 Mil/uL (ref 4.22–5.81)
WBC: 6.5 10*3/uL (ref 4.5–10.5)

## 2010-09-13 LAB — HEPATIC FUNCTION PANEL
ALT: 19 U/L (ref 0–53)
AST: 21 U/L (ref 0–37)
Albumin: 4.7 g/dL (ref 3.5–5.2)
Alkaline Phosphatase: 57 U/L (ref 39–117)
Bilirubin, Direct: 0.1 mg/dL (ref 0.0–0.3)
Total Protein: 6.9 g/dL (ref 6.0–8.3)

## 2010-09-13 LAB — BASIC METABOLIC PANEL
BUN: 25 mg/dL — ABNORMAL HIGH (ref 6–23)
Calcium: 10 mg/dL (ref 8.4–10.5)
Chloride: 102 mEq/L (ref 96–112)
Creatinine, Ser: 1.2 mg/dL (ref 0.4–1.5)
GFR: 65.57 mL/min (ref >60.00)

## 2010-09-13 LAB — LIPID PANEL
Cholesterol: 248 mg/dL — ABNORMAL HIGH (ref 0–200)
Triglycerides: 229 mg/dL — ABNORMAL HIGH (ref 0.0–149.0)

## 2010-09-13 LAB — URINALYSIS, ROUTINE W REFLEX MICROSCOPIC
Ketones, ur: NEGATIVE
Specific Gravity, Urine: >=1.03 (ref 1.000–1.030)
Total Protein, Urine: NEGATIVE
Urine Glucose: NEGATIVE
Urobilinogen, UA: 0.2 (ref 0.0–1.0)

## 2010-09-13 MED ORDER — AMLODIPINE-OLMESARTAN 10-20 MG PO TABS: 1.0000 | ORAL_TABLET | Freq: Every day | ORAL | Status: DC

## 2010-09-13 MED ORDER — ZOLPIDEM TARTRATE 10 MG PO TABS: 10.0000 mg | ORAL_TABLET | Freq: Every evening | ORAL | Status: DC | PRN

## 2010-09-13 MED ORDER — SERTRALINE HCL 100 MG PO TABS: ORAL_TABLET | ORAL | Status: DC

## 2010-09-13 MED ORDER — TESTOSTERONE CYPIONATE 200 MG/ML IM SOLN: INTRAMUSCULAR | Status: DC

## 2010-09-13 MED ORDER — MELOXICAM 15 MG PO TABS: 15.0000 mg | ORAL_TABLET | Freq: Every day | ORAL | Status: DC

## 2010-09-13 NOTE — Patient Instructions (Signed)
Continue all other medications as before Please go to XRAY in the Basement for the x-ray test Please go to LAB in the Basement for the blood and/or urine tests to be done today Please call the phone number [phone] (the [employer]Tree System) for results of testing in 2-3 days;  When calling, simply dial the number, and when prompted enter the MRN number above (the Medical Record Number) and the # key, then the message should start. You will be contacted regarding the referral for: colonoscopy Your work statement will be faxed

## 2010-09-13 NOTE — Assessment & Plan Note (Signed)

## 2010-09-13 NOTE — Assessment & Plan Note (Signed)
?   [facility] vs other -for cxr today

## 2010-09-13 NOTE — Progress Notes (Signed)
Subjective:    Patient ID: Derrick Kelley, male    DOB: 1960/10/05, 50 y.o.   MRN: [id number]  HPIere for wellness and f/u;  Overall doing ok;  Pt denies  worsening SOB, DOE, wheezing, orthopnea, PND, worsening LE edema, palpitations, dizziness or syncope.  Pt denies neurological change such as new Headache, facial or extremity weakness.  Pt denies polydipsia, polyuria, or low sugar symptoms. Pt states overall good compliance with treatment and medications, good tolerability, and trying to follow lower cholesterol diet.  Pt denies worsening depressive symptoms, suicidal ideation or panic. No fever, wt loss, night sweats, loss of appetite, or other constitutional symptoms.  Pt states good ability with ADL's, low fall risk, home safety reviewed and adequate, no significant changes in hearing or vision, and occasionally active with exercise.  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  Now with also 2 wks sharp stabbing pain to most upper lateral chest pain near the shoulder that clearly is worse to deep breathe, but not necessarily with moving the shoulder.  Past Medical History  Diagnosis Date  . BACK PAIN, CHRONIC 05/11/2009  . DEPRESSION 05/11/2009  . Flushing 05/11/2009  . GERD 05/11/2009  . Headache 10/10/2009  . HYPERLIPIDEMIA 05/11/2009  . HYPERTENSION 05/11/2009  . HYPOGONADISM 05/11/2009  . INSOMNIA-SLEEP DISORDER-UNSPEC 10/10/2009  . POLYCYTHEMIA 05/11/2009  . SCIATICA, RIGHT 09/20/2009  . SINUSITIS- ACUTE-NOS 10/10/2009  . URI 05/11/2009   Past Surgical History  Procedure Date  . Left clavicle fracture   . Hx eye surgury 2001   . Hx back surgury lumbar fusion 2006  . Hx left  shoulder surgury 2002 and August 10, 2009    Dr. Ave Filter, ortho    reports that he has never smoked. He does not have any smokeless tobacco history on file. He reports that he drinks alcohol. He reports that he does not use illicit drugs. family history includes Alcohol abuse in his father, maternal aunt, maternal uncle, and other and Cancer in his father, maternal grandmother, and paternal uncle. Allergies  Allergen Reactions  . Bupropion Hcl     REACTION: nightmares   Current Outpatient Prescriptions on File Prior to Visit  Medication Sig Dispense Refill  . amlodipine-olmesartan (AZOR) 10-20 MG per tablet Take 1 tablet by mouth daily.        . sertraline (ZOLOFT) 100 MG tablet 1 and 1/2 by mouth once daily       . testosterone cypionate (DEPO-TESTOSTERONE) 200 MG/ML injection 1 cc biweekly IM (self Injected) as needed       . zolpidem (AMBIEN) 10 MG tablet Take 10 mg by mouth at bedtime as needed.        . SUMAtriptan (IMITREX) 100 MG tablet Take 100 mg by mouth daily as needed.         Review of Systems Review of Systems  Constitutional: Negative for diaphoresis, activity change, appetite change and unexpected weight change.  HENT: Negative for hearing loss, ear pain, facial swelling, mouth sores and neck stiffness.   Eyes: Negative for pain, redness and visual disturbance.  Respiratory: Negative for shortness of breath and wheezing.   Cardiovascular: Negative for chest pain and palpitations.  Gastrointestinal: Negative for  diarrhea, blood in stool, abdominal distention and rectal pain.  Genitourinary: Negative for hematuria, flank pain and decreased urine volume.  Musculoskeletal: Negative for myalgias and joint swelling.  Skin: Negative for color change and wound.  Neurological: Negative for syncope and numbness.  Hematological: Negative for adenopathy.  Psychiatric/Behavioral: Negative for hallucinations, self-injury, decreased concentration and agitation.      Objective:    Physical Exam BP 122/80  Pulse 70  Temp(Src) 97.4 F (36.3 C) (Oral)  Ht 5\' 8"  (1.727 m)  Wt 190 lb 4 oz (86.297 kg)  BMI 28.93 kg/m2  SpO2 96% Physical Exam  VS noted Constitutional: Pt is oriented to person, place, and time. Appears well-developed and well-nourished.  HENT:  Head: Normocephalic and atraumatic.  Right Ear: External ear normal.  Left Ear: External ear normal.  Nose: Nose normal.  Mouth/Throat: Oropharynx is clear and moist.  Eyes: Conjunctivae and EOM are normal. Pupils are equal, round, and reactive to light.  Neck: Normal range of motion. Neck supple. No JVD present. No tracheal deviation present.  Cardiovascular: Normal rate, regular rhythm, normal heart sounds and intact distal pulses.   Pulmonary/Chest: Effort normal and breath sounds normal.  Abdominal: Soft. Bowel sounds are normal. There is no tenderness.  Musculoskeletal: Normal range of motion. Exhibits no edema.  Lymphadenopathy:  Has no cervical adenopathy.  Neurological: Pt is alert and oriented to person, place, and time. Pt has normal reflexes. No cranial nerve deficit.  Skin: Skin is warm and dry. No rash noted.  Psychiatric:  Has  normal mood and affect. Behavior is normal.  Mild left upper chest wall tender noted, no rash, swelling, erythema       Assessment & Plan:

## 2010-09-14 ENCOUNTER — Other Ambulatory Visit: Payer: Self-pay

## 2010-09-14 ENCOUNTER — Telehealth: Payer: Self-pay

## 2010-09-14 LAB — TESTOSTERONE, FREE, TOTAL, SHBG
Testosterone, Free: 39.3 pg/mL — ABNORMAL LOW (ref 47.0–244.0)
Testosterone-% Free: 1.8 % (ref 1.6–2.9)

## 2010-09-14 MED ORDER — ATORVASTATIN CALCIUM 10 MG PO TABS: 10.0000 mg | ORAL_TABLET | Freq: Every day | ORAL | Status: DC

## 2010-09-14 NOTE — Telephone Encounter (Signed)
Completed letter requested by patient at his 09/13/2010 OV and faxed to his employer at [phone]. Informed patient letter has been faxed.

## 2010-09-16 NOTE — Assessment & Plan Note (Signed)
stable overall by hx and exam, most recent data reviewed with pt, and pt to continue medical treatment as before 

## 2010-09-19 ENCOUNTER — Telehealth: Payer: Self-pay

## 2010-09-19 NOTE — Telephone Encounter (Signed)
Patient's employer would not allow him to return to work until Zolpidem rx had been discarded with a letter from MD stating so. Patient returned bottle of Zolpidem to our office to discard.  Informed Dr. Jonny Ruiz and completed letter for the patient.

## 2010-10-16 ENCOUNTER — Other Ambulatory Visit: Payer: BC Managed Care – PPO

## 2010-12-06 ENCOUNTER — Other Ambulatory Visit: Payer: Self-pay

## 2010-12-06 MED ORDER — TRAMADOL HCL 50 MG PO TABS: 50.0000 mg | ORAL_TABLET | Freq: Four times a day (QID) | ORAL | Status: AC | PRN

## 2010-12-06 NOTE — Telephone Encounter (Signed)
Patient called requesting a refill on Tramadol for shoulder pain. He stated he has not had a prescription in a long time, but does need it now. Pharmacy is [facility] [place]. Call back number [phone]

## 2010-12-06 NOTE — Telephone Encounter (Signed)
Patient informed of MD's instructions

## 2010-12-06 NOTE — Telephone Encounter (Signed)
Ok, but needs OV if persists

## 2011-02-12 HISTORY — PX: BACK SURGERY: SHX140

## 2011-04-09 ENCOUNTER — Other Ambulatory Visit: Payer: Self-pay | Admitting: Orthopedic Surgery

## 2011-04-09 DIAGNOSIS — M545 Low back pain: Secondary | ICD-10-CM

## 2011-04-10 ENCOUNTER — Ambulatory Visit
Admission: RE | Admit: 2011-04-10 | Discharge: 2011-04-10 | Disposition: A | Discharge status: Home / Self Care | Payer: 59 | Source: Ambulatory Visit | Attending: Orthopedic Surgery | Admitting: Orthopedic Surgery

## 2011-04-10 DIAGNOSIS — M545 Low back pain: Secondary | ICD-10-CM

## 2011-04-17 ENCOUNTER — Other Ambulatory Visit: Payer: Self-pay | Admitting: Orthopedic Surgery

## 2011-04-18 ENCOUNTER — Encounter (HOSPITAL_COMMUNITY): Payer: Self-pay

## 2011-04-19 ENCOUNTER — Encounter (HOSPITAL_COMMUNITY)
Admission: RE | Admit: 2011-04-19 | Discharge: 2011-04-19 | Disposition: A | Discharge status: Home / Self Care | Payer: 59 | Source: Ambulatory Visit | Attending: Orthopedic Surgery | Admitting: Orthopedic Surgery

## 2011-04-19 ENCOUNTER — Encounter (HOSPITAL_COMMUNITY): Payer: Self-pay

## 2011-04-19 HISTORY — DX: Other chronic pain: G89.29

## 2011-04-19 HISTORY — DX: Unspecified osteoarthritis, unspecified site: M19.90

## 2011-04-19 HISTORY — DX: Dorsalgia, unspecified: M54.9

## 2011-04-19 LAB — URINALYSIS, ROUTINE W REFLEX MICROSCOPIC
Hgb urine dipstick: NEGATIVE
Nitrite: NEGATIVE
Specific Gravity, Urine: 1.029 (ref 1.005–1.030)
Urobilinogen, UA: 0.2 mg/dL (ref 0.0–1.0)
pH: 5.5 (ref 5.0–8.0)

## 2011-04-19 LAB — COMPREHENSIVE METABOLIC PANEL
ALT: 18 U/L (ref 0–53)
Albumin: 3.7 g/dL (ref 3.5–5.2)
Alkaline Phosphatase: 53 U/L (ref 39–117)
Calcium: 9 mg/dL (ref 8.4–10.5)
Potassium: 4.2 mEq/L (ref 3.5–5.1)
Sodium: 141 mEq/L (ref 135–145)
Total Protein: 6.2 g/dL (ref 6.0–8.3)

## 2011-04-19 LAB — DIFFERENTIAL
Eosinophils Absolute: 0.1 10*3/uL (ref 0.0–0.7)
Eosinophils Relative: 3 % (ref 0–5)
Lymphocytes Relative: 23 % (ref 12–46)
Lymphs Abs: 1 10*3/uL (ref 0.7–4.0)
Monocytes Relative: 8 % (ref 3–12)

## 2011-04-19 LAB — CBC
Hemoglobin: 15.3 g/dL (ref 13.0–17.0)
MCHC: 33.3 g/dL (ref 30.0–36.0)
Platelets: 165 10*3/uL (ref 150–400)
RDW: 12.3 % (ref 11.5–15.5)

## 2011-04-19 LAB — APTT: aPTT: 30 seconds (ref 24–37)

## 2011-04-19 LAB — SURGICAL PCR SCREEN
MRSA, PCR: NEGATIVE
Staphylococcus aureus: POSITIVE — AB

## 2011-04-19 LAB — PROTIME-INR: INR: 1.05 (ref 0.00–1.49)

## 2011-04-19 NOTE — Progress Notes (Signed)
Pt doesn't have a cardiologist  Dr.Elkins in [place] manages HTN  Denies ever having a stress test/echo/heart cath

## 2011-04-19 NOTE — Pre-Procedure Instructions (Signed)
20 JOSSIAH SMOAK  04/19/2011   Your procedure is scheduled on:  Thurs, Mar 14 @ 3:15 PM  Report to [facility] at 1:15 PM.  Call this number if you have problems the morning of surgery: [phone]   Remember:   Do not eat food:After Midnight.  May have clear liquids: up to 4 Hours before arrival.(until 9:15 am)  Clear liquids include soda, tea, black coffee, apple or grape juice, broth.  Take these medicines the morning of surgery with A SIP OF WATER: Amlodipine,Pain Pill(if needed),and Zoloft   Do not wear jewelry, make-up or nail polish.  Do not wear lotions, powders, or perfumes. You may wear deodorant.  Do not shave 48 hours prior to surgery.  Do not bring valuables to the hospital.  Contacts, dentures or bridgework may not be worn into surgery.  Leave suitcase in the car. After surgery it may be brought to your room.  For patients admitted to the hospital, checkout time is 11:00 AM the day of discharge.   Patients discharged the day of surgery will not be allowed to drive home.     Special Instructions: CHG Shower Use Special Wash: 1/2 bottle night before surgery and 1/2 bottle morning of surgery.   Please read over the following fact sheets that you were given: Pain Booklet, Coughing and Deep Breathing, Blood Transfusion Information, MRSA Information and Surgical Site Infection Prevention

## 2011-04-24 MED ORDER — CEFAZOLIN SODIUM-DEXTROSE 2-3 GM-% IV SOLR
2.0000 g | INTRAVENOUS | Status: AC
  Administered 2011-04-25: 2 g via INTRAVENOUS
  Filled 2011-04-24: qty 50

## 2011-04-24 MED ORDER — POVIDONE-IODINE 7.5 % EX SOLN
Freq: Once | CUTANEOUS | Status: DC
  Filled 2011-04-24: qty 118

## 2011-04-25 ENCOUNTER — Encounter (HOSPITAL_COMMUNITY): Payer: Self-pay | Admitting: Registered Nurse

## 2011-04-25 ENCOUNTER — Other Ambulatory Visit: Payer: Self-pay

## 2011-04-25 ENCOUNTER — Ambulatory Visit (HOSPITAL_COMMUNITY): Payer: 59

## 2011-04-25 ENCOUNTER — Encounter (HOSPITAL_COMMUNITY): Payer: Self-pay | Admitting: *Deleted

## 2011-04-25 ENCOUNTER — Encounter (HOSPITAL_COMMUNITY)
Admission: RE | Disposition: A | Discharge status: Home / Self Care | Payer: Self-pay | Source: Ambulatory Visit | Attending: Orthopedic Surgery

## 2011-04-25 ENCOUNTER — Ambulatory Visit (HOSPITAL_COMMUNITY): Payer: 59 | Admitting: Registered Nurse

## 2011-04-25 ENCOUNTER — Inpatient Hospital Stay (HOSPITAL_COMMUNITY)
Admission: RE | Admit: 2011-04-25 | Discharge: 2011-04-28 | DRG: 460 | Disposition: A | Discharge status: Home / Self Care | Payer: 59 | Source: Ambulatory Visit | Attending: Orthopedic Surgery | Admitting: Orthopedic Surgery

## 2011-04-25 DIAGNOSIS — Z8041 Family history of malignant neoplasm of ovary: Secondary | ICD-10-CM

## 2011-04-25 DIAGNOSIS — I10 Essential (primary) hypertension: Secondary | ICD-10-CM | POA: Diagnosis present

## 2011-04-25 DIAGNOSIS — Z8042 Family history of malignant neoplasm of prostate: Secondary | ICD-10-CM

## 2011-04-25 DIAGNOSIS — Z01812 Encounter for preprocedural laboratory examination: Secondary | ICD-10-CM

## 2011-04-25 DIAGNOSIS — F329 Major depressive disorder, single episode, unspecified: Secondary | ICD-10-CM | POA: Diagnosis present

## 2011-04-25 DIAGNOSIS — F3289 Other specified depressive episodes: Secondary | ICD-10-CM | POA: Diagnosis present

## 2011-04-25 DIAGNOSIS — M545 Low back pain, unspecified: Secondary | ICD-10-CM | POA: Diagnosis present

## 2011-04-25 DIAGNOSIS — T84498A Other mechanical complication of other internal orthopedic devices, implants and grafts, initial encounter: Principal | ICD-10-CM | POA: Diagnosis present

## 2011-04-25 DIAGNOSIS — Y831 Surgical operation with implant of artificial internal device as the cause of abnormal reaction of the patient, or of later complication, without mention of misadventure at the time of the procedure: Secondary | ICD-10-CM | POA: Diagnosis present

## 2011-04-25 DIAGNOSIS — G8929 Other chronic pain: Secondary | ICD-10-CM | POA: Diagnosis present

## 2011-04-25 DIAGNOSIS — Y9210 Unspecified residential institution as the place of occurrence of the external cause: Secondary | ICD-10-CM | POA: Diagnosis not present

## 2011-04-25 DIAGNOSIS — K219 Gastro-esophageal reflux disease without esophagitis: Secondary | ICD-10-CM | POA: Diagnosis present

## 2011-04-25 DIAGNOSIS — Z6379 Other stressful life events affecting family and household: Secondary | ICD-10-CM

## 2011-04-25 DIAGNOSIS — E785 Hyperlipidemia, unspecified: Secondary | ICD-10-CM | POA: Diagnosis present

## 2011-04-25 DIAGNOSIS — Y92009 Unspecified place in unspecified non-institutional (private) residence as the place of occurrence of the external cause: Secondary | ICD-10-CM

## 2011-04-25 DIAGNOSIS — M549 Dorsalgia, unspecified: Secondary | ICD-10-CM

## 2011-04-25 DIAGNOSIS — I9589 Other hypotension: Secondary | ICD-10-CM | POA: Diagnosis not present

## 2011-04-25 LAB — TYPE AND SCREEN: ABO/RH(D): B POS

## 2011-04-25 SURGERY — POSTERIOR LUMBAR FUSION 1 WITH HARDWARE REMOVAL
Anesthesia: General | Site: Back | Laterality: Bilateral | Wound class: Clean

## 2011-04-25 MED ORDER — AMLODIPINE-OLMESARTAN 10-20 MG PO TABS: 1.0000 | ORAL_TABLET | Freq: Every day | ORAL | Status: DC

## 2011-04-25 MED ORDER — SODIUM CHLORIDE 0.9 % IJ SOLN: 9.0000 mL | INTRAMUSCULAR | Status: DC | PRN

## 2011-04-25 MED ORDER — ROCURONIUM BROMIDE 100 MG/10ML IV SOLN
INTRAVENOUS | Status: DC | PRN
  Administered 2011-04-25: 20 mg via INTRAVENOUS
  Administered 2011-04-25: 30 mg via INTRAVENOUS
  Administered 2011-04-25 (×2): 20 mg via INTRAVENOUS

## 2011-04-25 MED ORDER — CEFAZOLIN SODIUM 1-5 GM-% IV SOLN
1.0000 g | Freq: Three times a day (TID) | INTRAVENOUS | Status: AC
  Administered 2011-04-25 – 2011-04-26 (×2): 1 g via INTRAVENOUS
  Filled 2011-04-25 (×3): qty 50

## 2011-04-25 MED ORDER — MIDAZOLAM HCL 5 MG/5ML IJ SOLN
INTRAMUSCULAR | Status: DC | PRN
  Administered 2011-04-25: 2 mg via INTRAVENOUS

## 2011-04-25 MED ORDER — IRBESARTAN 150 MG PO TABS
150.0000 mg | ORAL_TABLET | Freq: Every day | ORAL | Status: DC
  Administered 2011-04-26: 150 mg via ORAL
  Filled 2011-04-25: qty 1

## 2011-04-25 MED ORDER — LIDOCAINE HCL (CARDIAC) 20 MG/ML IV SOLN
INTRAVENOUS | Status: DC | PRN
  Administered 2011-04-25: 80 mg via INTRAVENOUS

## 2011-04-25 MED ORDER — HYDROMORPHONE 0.3 MG/ML IV SOLN
INTRAVENOUS | Status: AC
  Administered 2011-04-25: 20:00:00
  Filled 2011-04-25: qty 25

## 2011-04-25 MED ORDER — TESTOSTERONE CYPIONATE 200 MG/ML IM SOLN: 200.0000 mg | INTRAMUSCULAR | Status: DC

## 2011-04-25 MED ORDER — ALBUMIN HUMAN 5 % IV SOLN
INTRAVENOUS | Status: DC | PRN
  Administered 2011-04-25 (×2): via INTRAVENOUS

## 2011-04-25 MED ORDER — BUPIVACAINE-EPINEPHRINE 0.25% -1:200000 IJ SOLN
INTRAMUSCULAR | Status: DC | PRN
  Administered 2011-04-25: 10 mL

## 2011-04-25 MED ORDER — HYDROMORPHONE HCL PF 1 MG/ML IJ SOLN: 0.2500 mg | INTRAMUSCULAR | Status: DC | PRN

## 2011-04-25 MED ORDER — NEOSTIGMINE METHYLSULFATE 1 MG/ML IJ SOLN
INTRAMUSCULAR | Status: DC | PRN
  Administered 2011-04-25: 4 mg via INTRAVENOUS

## 2011-04-25 MED ORDER — ONDANSETRON HCL 4 MG/2ML IJ SOLN
INTRAMUSCULAR | Status: DC | PRN
  Administered 2011-04-25: 4 mg via INTRAVENOUS

## 2011-04-25 MED ORDER — ACETAMINOPHEN 325 MG PO TABS: 650.0000 mg | ORAL_TABLET | ORAL | Status: DC | PRN

## 2011-04-25 MED ORDER — PROPOFOL 10 MG/ML IV EMUL
INTRAVENOUS | Status: DC | PRN
  Administered 2011-04-25: 300 mg via INTRAVENOUS

## 2011-04-25 MED ORDER — DIPHENHYDRAMINE HCL 12.5 MG/5ML PO ELIX: 12.5000 mg | ORAL_SOLUTION | Freq: Four times a day (QID) | ORAL | Status: DC | PRN

## 2011-04-25 MED ORDER — ONDANSETRON HCL 4 MG/2ML IJ SOLN: 4.0000 mg | Freq: Four times a day (QID) | INTRAMUSCULAR | Status: DC | PRN

## 2011-04-25 MED ORDER — GLYCOPYRROLATE 0.2 MG/ML IJ SOLN
INTRAMUSCULAR | Status: DC | PRN
  Administered 2011-04-25: .6 mg via INTRAVENOUS

## 2011-04-25 MED ORDER — SODIUM CHLORIDE 0.9 % IJ SOLN: 3.0000 mL | INTRAMUSCULAR | Status: DC | PRN

## 2011-04-25 MED ORDER — HYDROXYZINE HCL 50 MG/ML IM SOLN
50.0000 mg | INTRAMUSCULAR | Status: DC | PRN
  Filled 2011-04-25: qty 1

## 2011-04-25 MED ORDER — ONDANSETRON HCL 4 MG/2ML IJ SOLN: 4.0000 mg | Freq: Once | INTRAMUSCULAR | Status: DC | PRN

## 2011-04-25 MED ORDER — MORPHINE SULFATE 4 MG/ML IJ SOLN
2.0000 mg | INTRAMUSCULAR | Status: DC | PRN
  Administered 2011-04-25 – 2011-04-26 (×3): 2 mg via INTRAVENOUS
  Administered 2011-04-26: via INTRAVENOUS
  Administered 2011-04-26 – 2011-04-28 (×14): 2 mg via INTRAVENOUS
  Filled 2011-04-25 (×15): qty 1

## 2011-04-25 MED ORDER — SODIUM CHLORIDE 0.9 % IJ SOLN
3.0000 mL | Freq: Two times a day (BID) | INTRAMUSCULAR | Status: DC
  Administered 2011-04-27 – 2011-04-28 (×3): 3 mL via INTRAVENOUS

## 2011-04-25 MED ORDER — POTASSIUM CHLORIDE IN NACL 20-0.9 MEQ/L-% IV SOLN
INTRAVENOUS | Status: DC
  Administered 2011-04-25: 1000 mL via INTRAVENOUS
  Administered 2011-04-26 – 2011-04-27 (×2): via INTRAVENOUS
  Filled 2011-04-25 (×7): qty 1000

## 2011-04-25 MED ORDER — PHENOL 1.4 % MT LIQD: 1.0000 | OROMUCOSAL | Status: DC | PRN

## 2011-04-25 MED ORDER — NALOXONE HCL 0.4 MG/ML IJ SOLN: 0.4000 mg | INTRAMUSCULAR | Status: DC | PRN

## 2011-04-25 MED ORDER — SODIUM CHLORIDE 0.9 % IV SOLN
INTRAVENOUS | Status: DC | PRN
  Administered 2011-04-25: 16:00:00 via INTRAVENOUS

## 2011-04-25 MED ORDER — HYDROMORPHONE HCL PF 1 MG/ML IJ SOLN
INTRAMUSCULAR | Status: AC
  Filled 2011-04-25: qty 1

## 2011-04-25 MED ORDER — OXYCODONE HCL 20 MG PO TB12
20.0000 mg | ORAL_TABLET | Freq: Two times a day (BID) | ORAL | Status: DC
  Administered 2011-04-25 – 2011-04-26 (×2): 20 mg via ORAL
  Filled 2011-04-25 (×2): qty 1

## 2011-04-25 MED ORDER — ZOLPIDEM TARTRATE 10 MG PO TABS: 10.0000 mg | ORAL_TABLET | Freq: Every evening | ORAL | Status: DC | PRN

## 2011-04-25 MED ORDER — HYDROMORPHONE 0.3 MG/ML IV SOLN
INTRAVENOUS | Status: DC
  Administered 2011-04-26: 3.39 mg via INTRAVENOUS
  Administered 2011-04-26: 4.999 mg via INTRAVENOUS
  Administered 2011-04-26: 3.59 mg via INTRAVENOUS
  Administered 2011-04-26: 03:00:00 via INTRAVENOUS

## 2011-04-25 MED ORDER — MORPHINE SULFATE 4 MG/ML IJ SOLN
INTRAMUSCULAR | Status: AC
  Administered 2011-04-25: 2 mg via INTRAVENOUS
  Filled 2011-04-25: qty 1

## 2011-04-25 MED ORDER — DIAZEPAM 5 MG PO TABS
5.0000 mg | ORAL_TABLET | Freq: Four times a day (QID) | ORAL | Status: DC | PRN
  Administered 2011-04-25 – 2011-04-28 (×9): 5 mg via ORAL
  Filled 2011-04-25 (×9): qty 1

## 2011-04-25 MED ORDER — HYDROMORPHONE HCL PF 1 MG/ML IJ SOLN
0.2500 mg | INTRAMUSCULAR | Status: DC | PRN
  Administered 2011-04-25 (×4): 0.5 mg via INTRAVENOUS

## 2011-04-25 MED ORDER — THROMBIN 20000 UNITS EX KIT
PACK | OROMUCOSAL | Status: DC | PRN
  Administered 2011-04-25: 13:00:00 via TOPICAL

## 2011-04-25 MED ORDER — HETASTARCH-ELECTROLYTES 6 % IV SOLN
INTRAVENOUS | Status: DC | PRN
  Administered 2011-04-25: 14:00:00 via INTRAVENOUS

## 2011-04-25 MED ORDER — SERTRALINE HCL 50 MG PO TABS
150.0000 mg | ORAL_TABLET | Freq: Every day | ORAL | Status: DC
  Administered 2011-04-25 – 2011-04-28 (×4): 150 mg via ORAL
  Filled 2011-04-25 (×4): qty 1

## 2011-04-25 MED ORDER — SODIUM CHLORIDE 0.9 % IV SOLN: 250.0000 mL | INTRAVENOUS | Status: DC

## 2011-04-25 MED ORDER — LACTATED RINGERS IV SOLN
INTRAVENOUS | Status: DC
  Administered 2011-04-25 (×3): via INTRAVENOUS

## 2011-04-25 MED ORDER — DIPHENHYDRAMINE HCL 50 MG/ML IJ SOLN
12.5000 mg | Freq: Four times a day (QID) | INTRAMUSCULAR | Status: DC | PRN
  Administered 2011-04-26: 12.5 mg via INTRAVENOUS
  Filled 2011-04-25: qty 1

## 2011-04-25 MED ORDER — HYDROXYZINE HCL 25 MG PO TABS: 50.0000 mg | ORAL_TABLET | ORAL | Status: DC | PRN

## 2011-04-25 MED ORDER — OXYCODONE HCL 5 MG PO TABS
5.0000 mg | ORAL_TABLET | ORAL | Status: DC | PRN
  Administered 2011-04-25 – 2011-04-27 (×6): 5 mg via ORAL
  Filled 2011-04-25 (×6): qty 1

## 2011-04-25 MED ORDER — AMLODIPINE BESYLATE 10 MG PO TABS
10.0000 mg | ORAL_TABLET | Freq: Every day | ORAL | Status: DC
  Administered 2011-04-26: 10 mg via ORAL
  Filled 2011-04-25: qty 1

## 2011-04-25 MED ORDER — OXYCODONE-ACETAMINOPHEN 5-325 MG PO TABS
2.0000 | ORAL_TABLET | ORAL | Status: DC | PRN
  Administered 2011-04-25: 2 via ORAL
  Filled 2011-04-25: qty 2

## 2011-04-25 MED ORDER — ZOLPIDEM TARTRATE 5 MG PO TABS: 5.0000 mg | ORAL_TABLET | Freq: Every evening | ORAL | Status: DC | PRN

## 2011-04-25 MED ORDER — DIPHENHYDRAMINE HCL 50 MG/ML IJ SOLN: 12.5000 mg | Freq: Four times a day (QID) | INTRAMUSCULAR | Status: DC | PRN

## 2011-04-25 MED ORDER — FENTANYL CITRATE 0.05 MG/ML IJ SOLN
INTRAMUSCULAR | Status: DC | PRN
  Administered 2011-04-25: 250 ug via INTRAVENOUS
  Administered 2011-04-25: 50 ug via INTRAVENOUS
  Administered 2011-04-25: 100 ug via INTRAVENOUS

## 2011-04-25 MED ORDER — SUCCINYLCHOLINE CHLORIDE 20 MG/ML IJ SOLN
INTRAMUSCULAR | Status: DC | PRN
  Administered 2011-04-25: 100 mg via INTRAVENOUS

## 2011-04-25 MED ORDER — ACETAMINOPHEN 650 MG RE SUPP: 650.0000 mg | RECTAL | Status: DC | PRN

## 2011-04-25 MED ORDER — MENTHOL 3 MG MT LOZG: 1.0000 | LOZENGE | OROMUCOSAL | Status: DC | PRN

## 2011-04-25 MED ORDER — ZOLPIDEM TARTRATE 10 MG PO TABS
10.0000 mg | ORAL_TABLET | Freq: Every evening | ORAL | Status: DC | PRN
  Administered 2011-04-25 – 2011-04-27 (×3): 10 mg via ORAL
  Filled 2011-04-25 (×3): qty 1

## 2011-04-25 MED ORDER — LIDOCAINE HCL 4 % MT SOLN
OROMUCOSAL | Status: DC | PRN
  Administered 2011-04-25: 4 mL via TOPICAL

## 2011-04-25 MED ORDER — EPHEDRINE SULFATE 50 MG/ML IJ SOLN
INTRAMUSCULAR | Status: DC | PRN
  Administered 2011-04-25 (×3): 10 mg via INTRAVENOUS
  Administered 2011-04-25 (×2): 15 mg via INTRAVENOUS
  Administered 2011-04-25: 10 mg via INTRAVENOUS

## 2011-04-25 SURGICAL SUPPLY — 70 items
BENZOIN TINCTURE PRP APPL 2/3 (GAUZE/BANDAGES/DRESSINGS) ×2 IMPLANT
BLADE SURG ROTATE 9660 (MISCELLANEOUS) IMPLANT
BONE CANC CHIPS 20CC PCAN1/4 (Bone Implant) ×2 IMPLANT
BUR ROUND PRECISION 4.0 (BURR) ×2 IMPLANT
CARTRIDGE OIL MAESTRO DRILL (MISCELLANEOUS) IMPLANT
CHIPS CANC BONE 20CC PCAN1/4 (Bone Implant) ×1 IMPLANT
CLOSURE STERI-STRIP 1/4X4 (GAUZE/BANDAGES/DRESSINGS) ×2 IMPLANT
CLOTH BEACON ORANGE TIMEOUT ST (SAFETY) ×2 IMPLANT
CONT SPEC STER OR (MISCELLANEOUS) ×2 IMPLANT
CORDS BIPOLAR (ELECTRODE) ×2 IMPLANT
COVER SURGICAL LIGHT HANDLE (MISCELLANEOUS) ×2 IMPLANT
DIFFUSER DRILL AIR PNEUMATIC (MISCELLANEOUS) IMPLANT
DRAIN CHANNEL 15F RND FF W/TCR (WOUND CARE) IMPLANT
DRAPE C-ARM 42X72 X-RAY (DRAPES) ×2 IMPLANT
DRAPE ORTHO SPLIT 77X108 STRL (DRAPES) ×1
DRAPE POUCH INSTRU U-SHP 10X18 (DRAPES) ×2 IMPLANT
DRAPE SURG 17X23 STRL (DRAPES) ×6 IMPLANT
DRAPE SURG ORHT 6 SPLT 77X108 (DRAPES) ×1 IMPLANT
DURAPREP 26ML APPLICATOR (WOUND CARE) ×2 IMPLANT
ELECT BLADE 4.0 EZ CLEAN MEGAD (MISCELLANEOUS) ×2
ELECT CAUTERY BLADE 6.4 (BLADE) ×2 IMPLANT
ELECT REM PT RETURN 9FT ADLT (ELECTROSURGICAL) ×2
ELECTRODE BLDE 4.0 EZ CLN MEGD (MISCELLANEOUS) ×1 IMPLANT
ELECTRODE REM PT RTRN 9FT ADLT (ELECTROSURGICAL) ×1 IMPLANT
EVACUATOR SILICONE 100CC (DRAIN) IMPLANT
GAUZE SPONGE 4X4 16PLY XRAY LF (GAUZE/BANDAGES/DRESSINGS) ×8 IMPLANT
GLOVE BIO SURGEON STRL SZ8 (GLOVE) ×2 IMPLANT
GLOVE BIOGEL PI IND STRL 8 (GLOVE) ×1 IMPLANT
GLOVE BIOGEL PI INDICATOR 8 (GLOVE) ×1
GOWN PREVENTION PLUS XLARGE (GOWN DISPOSABLE) ×2 IMPLANT
GOWN STRL NON-REIN LRG LVL3 (GOWN DISPOSABLE) ×4 IMPLANT
IV CATH 14GX2 1/4 (CATHETERS) ×2 IMPLANT
KIT BASIN OR (CUSTOM PROCEDURE TRAY) ×2 IMPLANT
KIT POSITION SURG JACKSON T1 (MISCELLANEOUS) ×2 IMPLANT
KIT ROOM TURNOVER OR (KITS) ×2 IMPLANT
MARKER SKIN DUAL TIP RULER LAB (MISCELLANEOUS) ×2 IMPLANT
NEEDLE BONE MARROW 8GX6 FENEST (NEEDLE) IMPLANT
NEEDLE HYPO 25GX1X1/2 BEV (NEEDLE) ×2 IMPLANT
NEEDLE SPNL 18GX3.5 QUINCKE PK (NEEDLE) ×4 IMPLANT
NS IRRIG 1000ML POUR BTL (IV SOLUTION) ×2 IMPLANT
OIL CARTRIDGE MAESTRO DRILL (MISCELLANEOUS)
PACK LAMINECTOMY ORTHO (CUSTOM PROCEDURE TRAY) ×2 IMPLANT
PACK UNIVERSAL I (CUSTOM PROCEDURE TRAY) ×2 IMPLANT
PACK VITOSS BIOACTIVE 10CC (Neuro Prosthesis/Implant) ×2 IMPLANT
PAD ARMBOARD 7.5X6 YLW CONV (MISCELLANEOUS) ×4 IMPLANT
PATTIES SURGICAL .5 X1 (DISPOSABLE) ×2 IMPLANT
PATTIES SURGICAL .5X1.5 (GAUZE/BANDAGES/DRESSINGS) ×2 IMPLANT
ROD PRE BENT EXPEDIUM 35MM (Rod) ×4 IMPLANT
SCREW POLY EXPEDIUM 8.0X45MM (Screw) ×8 IMPLANT
SCREW SET SINGLE INNER (Screw) ×2 IMPLANT
SPONGE GAUZE 4X4 12PLY (GAUZE/BANDAGES/DRESSINGS) ×2 IMPLANT
SPONGE INTESTINAL PEANUT (DISPOSABLE) ×2 IMPLANT
SPONGE SURGIFOAM ABS GEL 100 (HEMOSTASIS) IMPLANT
STRIP CLOSURE SKIN 1/2X4 (GAUZE/BANDAGES/DRESSINGS) IMPLANT
SURGIFLO TRUKIT (HEMOSTASIS) IMPLANT
SUT MNCRL AB 3-0 PS2 18 (SUTURE) ×2 IMPLANT
SUT MNCRL AB 4-0 PS2 18 (SUTURE) ×2 IMPLANT
SUT VIC AB 0 CT1 18XCR BRD 8 (SUTURE) ×1 IMPLANT
SUT VIC AB 0 CT1 8-18 (SUTURE) ×1
SUT VIC AB 1 CT1 18XCR BRD 8 (SUTURE) ×2 IMPLANT
SUT VIC AB 1 CT1 8-18 (SUTURE) ×2
SUT VIC AB 2-0 CT2 18 VCP726D (SUTURE) ×2 IMPLANT
SYR 20CC LL (SYRINGE) ×2 IMPLANT
SYR BULB IRRIGATION 50ML (SYRINGE) ×2 IMPLANT
SYR CONTROL 10ML LL (SYRINGE) ×2 IMPLANT
TOWEL OR 17X24 6PK STRL BLUE (TOWEL DISPOSABLE) ×2 IMPLANT
TOWEL OR 17X26 10 PK STRL BLUE (TOWEL DISPOSABLE) ×2 IMPLANT
TRAY FOLEY CATH 14FR (SET/KITS/TRAYS/PACK) ×2 IMPLANT
WATER STERILE IRR 1000ML POUR (IV SOLUTION) ×2 IMPLANT
YANKAUER SUCT BULB TIP NO VENT (SUCTIONS) ×2 IMPLANT

## 2011-04-25 NOTE — Transfer of Care (Signed)
Immediate Anesthesia Transfer of Care Note  Patient: Derrick Kelley  Procedure(s) Performed: Procedure(s) (LRB): POSTERIOR LUMBAR FUSION 1 WITH HARDWARE REMOVAL (Bilateral)  Patient Location: PACU  Anesthesia Type: General  Level of Consciousness: awake, alert  and oriented  Airway & Oxygen Therapy: Patient Spontanous Breathing and Patient connected to nasal cannula oxygen  Post-op Assessment: Report given to PACU RN and Post -op Vital signs reviewed and stable  Post vital signs: Reviewed and stable  Complications: No apparent anesthesia complications

## 2011-04-25 NOTE — Telephone Encounter (Signed)
Faxed hardcopy to pharmacy. 

## 2011-04-25 NOTE — H&P (Signed)
PREOPERATIVE H&P  Chief Complaint: low back pain  HPI: Derrick Kelley is a 51 y.o. male who presents with low back pain  Past Medical History  Diagnosis Date  . BACK PAIN, CHRONIC 05/11/2009  . Flushing 05/11/2009  . GERD 05/11/2009  . Headache 10/10/2009  . HYPERLIPIDEMIA   . HYPOGONADISM 05/11/2009  . INSOMNIA-SLEEP DISORDER-UNSPEC 10/10/2009    takes Ambien nightly  . POLYCYTHEMIA 05/11/2009  . SCIATICA, RIGHT 09/20/2009  . SINUSITIS- ACUTE-NOS 10/10/2009  . URI 05/11/2009  . HYPERTENSION     takes Azor daily  . Arthritis   . Chronic back pain     hx buldging disc;  . Hemorrhoids     hx of  . DEPRESSION     takes Zoloft daily   Past Surgical History  Procedure Date  . Left clavicle fracture   . Hx eye surgury 2001 many yrs ago    pterygium-both eyes  . Hx back surgury lumbar fusion 2007  . Hx left shoulder surgury 2002 and August 10, 2009    Dr. Ave Filter, ortho  . Mouth surgery     wisdom teeth extracted   History   Social History  . Marital Status: Married    Spouse Name: N/A    Number of Children: 2  . Years of Education: N/A   Occupational History  . CONSTRUCTION WORKER    Social History Main Topics  . Smoking status: Never Smoker   . Smokeless tobacco: Not on file  . Alcohol Use: No  . Drug Use: No     former cocaine, none for years  . Sexually Active: Yes   Other Topics Concern  . Not on file   Social History Narrative  . No narrative on file   Family History  Problem Relation Age of Onset  . Alcohol abuse Father   . Cancer Father     prostate  . Alcohol abuse Maternal Aunt   . Alcohol abuse Maternal Uncle   . Cancer Paternal Uncle     prostate  . Cancer Maternal Grandmother     ovarian cancer  . Alcohol abuse Other   . Anesthesia problems Neg Hx   . Hypotension Neg Hx   . Malignant hyperthermia Neg Hx   . Pseudochol deficiency Neg Hx    Allergies  Allergen Reactions  . Bupropion Hcl     REACTION: nightmares  . Codeine Hives    Prior to Admission medications   Medication Sig Start Date End Date Taking? Authorizing Provider  amlodipine-olmesartan (AZOR) 10-20 MG per tablet Take 1 tablet by mouth daily. 09/13/10  Yes Corwin Levins, MD  meloxicam (MOBIC) 15 MG tablet Take 15 mg by mouth daily. 09/13/10 09/13/11 Yes Corwin Levins, MD  oxyCODONE-acetaminophen (PERCOCET) 10-325 MG per tablet Take 1-2 tablets by mouth every 4 (four) hours as needed. For pain   Yes Historical Provider, MD  sertraline (ZOLOFT) 100 MG tablet Take 150 mg by mouth daily. 09/13/10  Yes Corwin Levins, MD  testosterone cypionate (DEPOTESTOTERONE CYPIONATE) 200 MG/ML injection Inject 200 mg into the muscle every 14 (fourteen) days. 1 cc biweekly IM (self Injected)  Last injection was on 04/12/11 09/13/10  Yes Corwin Levins, MD  zolpidem (AMBIEN) 10 MG tablet Take 10 mg by mouth at bedtime as needed. For sleep 09/13/10  Yes Corwin Levins, MD     All other systems have been reviewed and were otherwise negative with the exception of those mentioned in the HPI and  as above.  Physical Exam: There were no vitals filed for this visit.  General: Alert, no acute distress Cardiovascular: No pedal edema Respiratory: No cyanosis, no use of accessory musculature GI: No organomegaly, abdomen is soft and non-tender Skin: No lesions in the area of chief complaint Neurologic: Sensation intact distally Psychiatric: Patient is competent for consent with normal mood and affect Lymphatic: No axillary or cervical lymphadenopathy  MUSCULOSKELETAL: + pain with ROM low back  Assessment/Plan: Low back pain Plan for Procedure(s): POSTERIOR LUMBAR FUSION 1 WITH HARDWARE REMOVAL   Emilee Hero, MD 04/25/2011 6:11 AM

## 2011-04-25 NOTE — Progress Notes (Signed)
Pt is s/p 1 level fusion with hardware removal. Pt has decreased ROM of back related to surgery. Back precautions. Pt repts that preop low back pain and discomfort primarily of L leg. Pt repts that preop L leg pain is "better". Pt denies numbness, tingling and no weakness noted on neuro check. Pt can MAEx 4. Pt is alert and oriented x 3. Pt has a gauze dressing to low back that is shadowed. Areas are circled. Lungs CTA. Heart rate regular rate and rhythm. No s/sx cardiac or resp distress and no c/o such. Vital signs stable. Foley draining mod amount of pale yellow urine. Pt repts LBM was yesterday. Abdomen is soft flat nontender and nondistended. BS+x4. Pt denies passing gas, nausea or vomiting. Will start pt on clear liquid diet per order. Pt instructed IS. Pt states, "Yeah, I know what that is". Pt repts that he has has IS in the past and understands use. Wife at bedside and supportive.

## 2011-04-25 NOTE — Preoperative (Signed)
Beta Blockers   Reason not to administer Beta Blockers:Not Applicable 

## 2011-04-25 NOTE — Telephone Encounter (Signed)
Done hardcopy to robin  

## 2011-04-25 NOTE — Anesthesia Preprocedure Evaluation (Signed)
Anesthesia Evaluation  Patient identified by MRN, date of birth, ID band Patient awake    Reviewed: Allergy & Precautions, H&P , NPO status , Patient's Chart, lab work & pertinent test results, reviewed documented beta blocker date and time   Airway Mallampati: I TM Distance: >3 FB Neck ROM: Full    Dental  (+) Teeth Intact and Dental Advisory Given   Pulmonary          Cardiovascular hypertension,     Neuro/Psych  Headaches, PSYCHIATRIC DISORDERS Depression  Neuromuscular disease    GI/Hepatic GERD-  ,  Endo/Other    Renal/GU      Musculoskeletal   Abdominal   Peds  Hematology   Anesthesia Other Findings   Reproductive/Obstetrics                           Anesthesia Physical Anesthesia Plan  ASA: II  Anesthesia Plan: General   Post-op Pain Management:    Induction: Intravenous  Airway Management Planned: Oral ETT  Additional Equipment:   Intra-op Plan:   Post-operative Plan: Extubation in OR  Informed Consent:   Dental advisory given  Plan Discussed with:   Anesthesia Plan Comments:         Anesthesia Quick Evaluation

## 2011-04-25 NOTE — Anesthesia Postprocedure Evaluation (Signed)
Anesthesia Post Note  Patient: Derrick Kelley  Procedure(s) Performed: Procedure(s) (LRB): POSTERIOR LUMBAR FUSION 1 WITH HARDWARE REMOVAL (Bilateral)  Anesthesia type: General  Patient location: PACU  Post pain: Pain level controlled  Post assessment: Patient's Cardiovascular Status Stable  Last Vitals:  Filed Vitals:   04/25/11 1745  BP:   Pulse: 91  Temp:   Resp: 16    Post vital signs: Reviewed and stable  Level of consciousness: alert  Complications: No apparent anesthesia complications

## 2011-04-26 MED ORDER — HYDROMORPHONE 0.3 MG/ML IV SOLN
INTRAVENOUS | Status: AC
  Filled 2011-04-26: qty 25

## 2011-04-26 MED ORDER — HYDROMORPHONE HCL 2 MG PO TABS
2.0000 mg | ORAL_TABLET | ORAL | Status: DC | PRN
  Administered 2011-04-26 – 2011-04-28 (×10): 2 mg via ORAL
  Filled 2011-04-26 (×10): qty 1

## 2011-04-26 MED ORDER — OXYCODONE HCL 40 MG PO TB12
40.0000 mg | ORAL_TABLET | Freq: Two times a day (BID) | ORAL | Status: DC
  Administered 2011-04-26 – 2011-04-28 (×4): 40 mg via ORAL
  Filled 2011-04-26 (×4): qty 1

## 2011-04-26 MED ORDER — SODIUM CHLORIDE 0.9 % IV BOLUS (SEPSIS)
500.0000 mL | Freq: Once | INTRAVENOUS | Status: AC
  Administered 2011-04-26: 500 mL via INTRAVENOUS

## 2011-04-26 NOTE — Progress Notes (Signed)
Physical Therapy Evaluation Note  Past Medical History  Diagnosis Date  . BACK PAIN, CHRONIC 05/11/2009  . Flushing 05/11/2009  . GERD 05/11/2009  . Headache 10/10/2009  . HYPERLIPIDEMIA   . HYPOGONADISM 05/11/2009  . INSOMNIA-SLEEP DISORDER-UNSPEC 10/10/2009    takes Ambien nightly  . POLYCYTHEMIA 05/11/2009  . SCIATICA, RIGHT 09/20/2009  . SINUSITIS- ACUTE-NOS 10/10/2009  . URI 05/11/2009  . HYPERTENSION     takes Azor daily  . Arthritis   . Chronic back pain     hx buldging disc;  . Hemorrhoids     hx of  . DEPRESSION     takes Zoloft daily    Past Surgical History  Procedure Date  . Left clavicle fracture   . Hx eye surgury 2001 many yrs ago    pterygium-both eyes  . Hx back surgury lumbar fusion 2007  . Hx left shoulder surgury 2002 and August 10, 2009    Dr. Ave Filter, ortho  . Mouth surgery     wisdom teeth extracted     04/26/11 0808  PT Visit Information  Last PT Received On 04/26/11  Precautions  Precautions Back  Required Braces or Orthoses No  Restrictions  Weight Bearing Restrictions No  Home Living  Lives With Spouse  Receives Help From (wife works from home)  Type of Home House  Home Layout One level  Home Access Stairs to enter  Entrance Stairs-Rails Left  Entrance Stairs-Number of Steps 2  Bathroom Shower/Tub Walk-in Pension scheme manager Yes  How Accessible Accessible via walker  Home Adaptive Equipment Hand-held shower hose (built in shower seat)  Prior Function  Level of Independence Independent with basic ADLs;Independent with gait;Independent with transfers  Able to Take Stairs? Yes  Driving Yes  Vocation Unemployed  Cognition  Arousal/Alertness Awake/alert  Overall Cognitive Status Appears within functional limits for tasks assessed  Orientation Level Oriented X4  Sensation  Light Touch Appears Intact (denies radiating LE numbness and tingling)  Bed Mobility  Bed Mobility Yes  Rolling Right 5:  Supervision  Rolling Right Details (indicate cue type and reason) verbal cues for sequencing, strong use of handrail  Right Sidelying to Sit 4: Min assist;HOB flat;With rails  Right Sidelying to Sit Details (indicate cue type and reason) assist for LE management, assist at trunk initially  Transfers  Transfers Yes  Sit to Stand 4: Min assist;From bed (bed elevated, contact guard)  Sit to Stand Details (indicate cue type and reason) verbal cues to adhere to back precautions  Ambulation/Gait  Ambulation/Gait Yes  Ambulation/Gait Assistance 4: Min assist (contact guard)  Ambulation Distance (Feet) 40 Feet  Assistive device Rolling walker  Gait Pattern Step-through pattern;Decreased stride length  Gait velocity decreased compared to prior to sx  Stairs No  Wheelchair Mobility  Wheelchair Mobility No  Posture/Postural Control  Posture/Postural Control (back precautions). Pt stood x 5 minutes to use urinal while maintaining back precautions with contact guard.  RUE Assessment  RUE Assessment WFL  LUE Assessment  LUE Assessment WFL  RLE Assessment  RLE Assessment WFL  LLE Assessment  LLE Assessment WFL  Cervical Assessment  Cervical Assessment WFL  Thoracic Assessment  Thoracic Assessment WFL  Lumbar Assessment  Lumbar Assessment (back precautions)  PT - End of Session  Equipment Utilized During Treatment Gait belt  Activity Tolerance Patient tolerated treatment well  Patient left in chair;with call bell in reach;with family/visitor present  Nurse Communication Mobility status for transfers;Mobility status for ambulation  General  Behavior During Session [facility] for tasks performed  Cognition [facility] for tasks performed  PT Assessment  Clinical Impression Statement Pt s/p posterior lumbar fusion with removal of hardware presenting with back precautions. Patient tolerate mobilty well however requires assist for lower body dressing and minA for bed mobility. Patient with good home set up  and support.  PT Recommendation/Assessment Patient will need skilled PT in the acute care venue  PT Problem List Decreased strength;Decreased range of motion;Decreased activity tolerance;Decreased balance;Decreased mobility  PT Therapy Diagnosis  Difficulty walking;Abnormality of gait;Generalized weakness;Acute pain  PT Plan  PT Frequency Min 5X/week  PT Treatment/Interventions DME instruction;Gait training;Stair training;Functional mobility training;Therapeutic activities;Therapeutic exercise  PT Recommendation  Follow Up Recommendations No PT follow up;Supervision for mobility/OOB  Equipment Recommended Rolling walker with 5" wheels;3 in 1 bedside comode  Individuals Consulted  Consulted and Agree with Results and Recommendations Patient  Acute Rehab PT Goals  PT Goal Formulation With patient  Time For Goal Achievement 7 days  Pt will Roll Supine to Right Side with modified independence  PT Goal: Rolling Supine to Right Side - Progress Goal set today  Pt will go Supine/Side to Sit with modified independence;with HOB 0 degrees  PT Goal: Supine/Side to Sit - Progress Goal set today  Pt will go Sit to Stand with modified independence  PT Goal: Sit to Stand - Progress Goal set today  Pt will go Stand to Sit with modified independence  PT Goal: Stand to Sit - Progress Goal set today  Pt will Ambulate >150 feet;with modified independence;with rolling walker  PT Goal: Ambulate - Progress Goal set today  Pt will Go Up / Down Stairs 1-2 stairs;with rail(s)  PT Goal: Up/Down Stairs - Progress Goal set today    Pain: 8/10 surgical low back pain  Lewis Shock, PT, DPT Pager #: [phone] Office #: [phone]

## 2011-04-26 NOTE — Op Note (Signed)
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  PROCEDURES: 1. Removal of instrumentation L4, L5, S1. 2. Placement of posterior instrumentation, L4. 3. Re-insertion of posterior instrumentation, L4, L5. 4. Intraoperative bone marrow aspiration from the patient's right     iliac crest using a separate incision. 5. Use of morselized allograft. 6. Posterior spinal fusion, L4-5. 7. Intraoperative use of fluoroscopy. 8. Exploration of spinal fusion.  SURGEON:  Estill Bamberg, MD.  ASSISTANT:  Janace Litten, OPA  ANESTHESIA:  General endotracheal anesthesia.  COMPLICATIONS:  None.  DISPOSITION:  Stable.  ESTIMATED BLOOD LOSS:  300 mL.  INDICATIONS FOR PROCEDURE:  Briefly, Mr. Derrick Kelley is a very pleasant 51- year-old male who presented to me with severe debilitating pain in his low back.  Of note, the patient was status post an instrumented spinal fusion from L4 to S1, but did going to have a nonunion at the L4-5 level.  This was confirmed with a CAT scan.  The patient had extensive forms of conservative care, but continued to complain of ongoing and debilitating pain.  We therefore did have a discussion regarding going forward with a revision instrumentation procedure at the L4-5 level with a revision of his previous fusion.  The patient fully understood that surgery is not a guarantee to address his low back pain.  The patient fully understood the risks and limitations of the procedure including nonunion, inch  incision segment disease, adjacent segment disease, and lack of resolution of back pain.  OPERATIVE DETAILS:  On April 25, 2011, the patient was brought to the surgery and general endotracheal anesthesia was administered.  The patient was placed prone on a well-padded Jackson bed with spinal frame. Antibiotics were given and SCDs were placed.  A time-out procedure was performed.  I then utilized the patient's previous incision, which was extended about 1 cm both above and below his previous incision.  The fascia was incised and extensive epidural scar was noted above the epidural space.  I was, however, able to bluntly retract the paraspinal muscular laterally and the lateral instrumentation on both the right and left sides was  readily identified.  I then proceeded with removing the patient's previously placed hardware.  Of note, this did take a rather extensive period of time, as the hardware was extremely difficult to remove.  There was extensive scar formation identified and there was extensive bone growth noted about the hardware and it did take a series of osteotomes as well as a curettes and a burr to adequately and safely remove the hardware at L4-L5 and at S1.  This was however done successfully.  I then used a Bovie to thoroughly explore the previous fusion at the L5-S1 level and the L4-5  level.  It was clear that there was discontinuity of the fusion at the L4-5 level.  This was confirmed by performing a pushing and pulling maneuver across the L4-5 interspace, which did result in bone movement across the L4-5 interspace.  I then proceeded with a Bovie to subperiosteally expose the transverse processes of L4 bilaterally and the fusion mass at the L5-S1 level.  At this point, I did use a high-speed burr to decorticate the pars interarticularis of L4 and L5 and the transverse process of L4.  At this point, the bone marrow aspirate was obtained from the patient's right iliac crest  using a separate incision.  The total of 8 mL of bone marrow aspirate was obtained.  I then selected a 10 mL of Vitoss BA and this was mixed with 20 mL of allograft chips.  This was packed into the posterolateral gutter on both the right and the left sides.  I then placed 8 x 45 mm screws, 2 in each vertebral body at the L4 and L5 levels and the holes that were previously made from the previous instrumentation.  Of note, prior to placing the autograft and allograft, I did copiously irrigate the wound with approximately 2 L of normal saline.  The retractors were relaxed approximately every 30 minutes throughout the procedure to optimize perfusion of the soft tissue.  Of note, when placing the L4 and L5 screws, I did use both AP and lateral fluoroscopy to confirm appropriate trajectory of the screws.  I was very happy with the final construct.  I then placed 35-mm rods bilaterally and caps were placed and a final locking procedure was performed. Again, I was very pleased with the final construct.  The fascia was then closed using #1 Vicryl.  The subcutaneous layer was closed using 2-0 Vicryl and the skin was closed using 4-0 Monocryl.  All instrument counts were correct at the termination of the procedure.  Of note, Janace Litten was my assistant throughout the procedure and aided in essential retraction and suctioning required throughout the surgery.     Estill Bamberg, MD     MD/MEDQ  D:  04/25/2011  T:  04/26/2011  Job:  [id number]  cc:   Dr. Jeannetta Nap

## 2011-04-26 NOTE — Progress Notes (Signed)
Patient had hypotension yesterday - improved with fluid bolus and holding BP meds. Taking dilaudid and oxycontin.  BP 128/70  Pulse 83  Temp(Src) 99 F (37.2 C) (Oral)  Resp 20  SpO2 94%  NVI Dressing CDI  POD #2 after 4/5 fusion  - continue dilaudid and oxycontin - continue PT - d/c home Sunday - heplock IV

## 2011-04-26 NOTE — Progress Notes (Signed)
Patient complaining of low back pain, much improved overnight.  No leg pain.  BP 111/67  Pulse 71  Temp(Src) 98.2 F (36.8 C) (Oral)  Resp 20  SpO2 95% NVI Dressing intact  POD #1 after revision instrumentation and L4/5 fusion  - pain control with oxycontin and percocet and valium - up with PT - Plan to d/c home sat vs Sunday - d/c pca and foley now

## 2011-04-27 MED ORDER — OXYCODONE HCL 40 MG PO TB12: 40.0000 mg | ORAL_TABLET | Freq: Two times a day (BID) | ORAL | Status: AC

## 2011-04-27 MED ORDER — HYDROMORPHONE HCL 2 MG PO TABS: 2.0000 mg | ORAL_TABLET | ORAL | Status: AC | PRN

## 2011-04-27 NOTE — Progress Notes (Signed)
Physical Therapy Treatment Note   04/27/11 1102  PT Visit Information  Last PT Received On 04/27/11  Precautions  Precautions Back  Required Braces or Orthoses No  Restrictions  Weight Bearing Restrictions No  Bed Mobility  Bed Mobility Yes  Rolling Right 5: Supervision  Rolling Right Details (indicate cue type and reason) min v/cs to maintain back precautions, use of handrail  Right Sidelying to Sit 5: Supervision;HOB flat;With rails  Right Sidelying to Sit Details (indicate cue type and reason) strong use of hand rail, increased time required  Transfers  Transfers Yes  Sit to Stand 5: Supervision;From bed  Sit to Stand Details (indicate cue type and reason) verbal cues to adhere to back precautions, increased time required  Ambulation/Gait  Ambulation/Gait Yes  Ambulation/Gait Assistance 5: Supervision  Ambulation/Gait Assistance Details (indicate cue type and reason) verbal cues to encourage reciprocal gait pattern, increased time required  Ambulation Distance (Feet) 150 Feet  Assistive device Rolling walker  Gait Pattern Step-through pattern;Decreased stride length  Gait velocity decreased compared to prior to sx however improved since yesterday  Stairs Yes  Stairs Assistance 4: Min assist (via hHA to mimic home set up)  Stairs Assistance Details (indicate cue type and reason) minA via R HHA to mimic home set up  Stair Management Technique One rail Left  Number of Stairs 2   Wheelchair Mobility  Wheelchair Mobility No  Posture/Postural Control  Posture/Postural Control No significant limitations  Balance  Balance Assessed Yes  Static Standing Balance  Static Standing - Balance Support During functional activity (pt stood to use urinal x 3 min)  Static Standing - Level of Assistance 6: Modified independent (Device/Increase time)  Static Standing - Comment/# of Minutes pt with no loss of balance  PT - End of Session  Activity Tolerance Patient tolerated treatment well    Patient left in chair;with call bell in reach (RN tech present to set up for bath)  Nurse Communication Mobility status for transfers  General  Behavior During Session [place] for tasks performed  Cognition [facility] for tasks performed  PT - Assessment/Plan  Comments on Treatment Session Patient with improved mobiltiy tolerance however remains to require encouragement to increase activity level. Patient con't to require reminders to adhere to back precautions and con't to require increased time for all transfers and ambulation. Patient safe to return home with spouse providing 24/7 supervision.  PT Plan Discharge plan remains appropriate  PT Frequency Min 5X/week  Recommendations for Other Services OT consult  Follow Up Recommendations No PT follow up;Supervision for mobility/OOB  Equipment Recommended Rolling walker with 5" wheels;3 in 1 bedside comode (RN notified)  Acute Rehab PT Goals  Time For Goal Achievement 7 days  PT Goal: Rolling Supine to Right Side - Progress Progressing toward goal  PT Goal: Supine/Side to Sit - Progress Progressing toward goal  PT Goal: Sit to Stand - Progress Progressing toward goal  PT Goal: Stand to Sit - Progress Progressing toward goal  PT Goal: Ambulate - Progress Progressing toward goal  PT Goal: Up/Down Stairs - Progress Progressing toward goal  Additional Goals  Additional Goal #1 Pt  100% compliant with back precautions.  PT Goal: Additional Goal #1 - Progress Progressing toward goal    Pain: 6/10 at surgical site  Lewis Shock, PT, DPT Pager #: [phone] Office #: [phone]

## 2011-04-27 NOTE — Progress Notes (Signed)
   CARE MANAGEMENT NOTE 04/27/2011  Patient:  LINDA, BIEHN   Account Number:  [id number]  Date Initiated:  04/27/2011  Documentation initiated by:  [facility]  Subjective/Objective Assessment:   POSTERIOR LUMBAR FUSION 1 WITH HARDWARE REMOVAL (Bilateral)     Action/Plan:   lives at home with spouse   Anticipated DC Date:  04/27/2011   Anticipated DC Plan:  HOME/SELF CARE      DC Planning Services  CM consult      Choice offered to / List presented to:     DME arranged  3-N-1  WALK[facility]      DME agency  [facility]        Status of service:  Completed, signed off Medicare Important Message given?   (If response is "NO", the following Medicare IM given date fields will be blank) Date Medicare IM given:   Date Additional Medicare IM given:    Discharge Disposition:  HOME/SELF CARE  Per UR Regulation:    If discussed at Long Length of Stay Meetings, dates discussed:    Comments:  04/27/2011 1500 Contacted [facility] for DME for scheduled d/c tomorrow. Encouraged pt to have wife assist unit RN on tomorrow with managing/changing dressing from surgical wound. Review signs and symptoms of infection with pt. Pt states he will speak to Unit RN. Isidoro Donning Rn CCM Case Mgmt phone [phone]

## 2011-04-27 NOTE — Evaluation (Signed)
Occupational Therapy Evaluation Patient Details Name: Derrick Kelley MRN: [id number] DOB: [birth year] Today's Date: 04/27/2011 14:02-14:35  evI Problem List:  Patient Active Problem List  Diagnoses  . HYPOGONADISM  . HYPERLIPIDEMIA  . POLYCYTHEMIA  . DEPRESSION  . HYPERTENSION  . GERD  . SCIATICA, RIGHT  . BACK PAIN, CHRONIC  . INSOMNIA-SLEEP DISORDER-UNSPEC  . [place]  . Headache  . Preventative health care  . Chest pain    Past Medical History:  Past Medical History  Diagnosis Date  . BACK PAIN, CHRONIC 05/11/2009  . Flushing 05/11/2009  . GERD 05/11/2009  . Headache 10/10/2009  . HYPERLIPIDEMIA   . HYPOGONADISM 05/11/2009  . INSOMNIA-SLEEP DISORDER-UNSPEC 10/10/2009    takes Ambien nightly  . POLYCYTHEMIA 05/11/2009  . SCIATICA, RIGHT 09/20/2009  . SINUSITIS- ACUTE-NOS 10/10/2009  . URI 05/11/2009  . HYPERTENSION     takes Azor daily  . Arthritis   . Chronic back pain     hx buldging disc;  . Hemorrhoids     hx of  . DEPRESSION     takes Zoloft daily   Past Surgical History:  Past Surgical History  Procedure Date  . Left clavicle fracture   . Hx eye surgury 2001 many yrs ago    pterygium-both eyes  . Hx back surgury lumbar fusion 2007  . Hx left shoulder surgury 2002 and August 10, 2009    Dr. Ave Filter, ortho  . Mouth surgery     wisdom teeth extracted    OT Assessment/Plan/Recommendation OT Assessment Clinical Impression Statement: Pleasant 51 yr old male admitted for elective lumbar fusion L4-L5.  Pt currently with increased pain an decreased overall independence with basic selfcare tasks.  Will have 24 hour supervision from his family and is overall currently supervision for toileting and shower transfers.  He needs min to mod assist for LB selfcare but could also be supervision with integration of AE.  Pt chooses to have his wife or family help him as needed with LB selfcare.  Recommend use of 3:1 for toilet and shower.  No further OT needs. OT  Recommendation/Assessment: Patient does not need any further OT services OT Recommendation Follow Up Recommendations: No OT follow up Equipment Recommended: 3 in 1 bedside comode  OT Evaluation Precautions/Restrictions  Precautions Precautions: Back Required Braces or Orthoses: No Restrictions Weight Bearing Restrictions: No Prior Functioning Home Living Lives With: Spouse Receives Help From:  (wife works from home) Type of Home: House Home Layout: One level Home Access: Stairs to enter Entrance Stairs-Rails: Left Entrance Stairs-Number of Steps: 2 Bathroom Shower/Tub: Health visitor: Standard Bathroom Accessibility: Yes How Accessible: Accessible via walker Home Adaptive Equipment: Hand-held shower hose (built in shower seat) Prior Function Level of Independence: Independent with basic ADLs;Independent with gait;Independent with transfers Driving: Yes Vocation: Unemployed ADL ADL Eating/Feeding: Simulated;Independent Where Assessed - Eating/Feeding: Chair Grooming: Simulated;Supervision/safety Where Assessed - Grooming: Standing at sink Upper Body Bathing: Simulated;Set up Where Assessed - Upper Body Bathing: Sitting, bed Lower Body Bathing: Simulated;Minimal assistance Where Assessed - Lower Body Bathing: Sit to stand from bed;Sit to stand from chair Upper Body Dressing: Simulated;Set up Where Assessed - Upper Body Dressing: Sitting, chair;Sitting, bed Lower Body Dressing: Simulated;Moderate assistance Where Assessed - Lower Body Dressing: Sit to stand from bed Toilet Transfer: Performed;Supervision/safety Toilet Transfer Method: Proofreader: Comfort height toilet;Other (comment) (Pt stood to urinate.) Toileting - Clothing Manipulation: Performed;Supervision/safety Where Assessed - Glass blower/designer Manipulation: Standing Toileting - Hygiene: Simulated;Supervision/safety Where Assessed - Toileting  Hygiene: Sit to stand from  3-in-1 or toilet Tub/Shower Transfer: Simulated;Supervision/safety Tub/Shower Transfer Details (indicate cue type and reason): Stepping backwards over the edge of a simulated walk-in shower. Tub/Shower Transfer Method: Ambulating;Anterior-posterior Tub/Shower Transfer Equipment: Walk in shower Equipment Used: Rolling walker Ambulation Related to ADLs: Pt overall supervision for mobility using RW to ambulate to the bathroom and short distance out in the hall. ADL Comments: Pt able to state 3/3 back precautions.  Unable to reach his feet for donning and doffing socks or shoes.  Demonstrated AE use to assist with LB dressing, however pt reports that his wife or children will likely assist with this.  Will need 3:1 for use over the standard toile and in the shower.   Vision/Perception  Vision - History Baseline Vision: No visual deficits Patient Visual Report: No change from baseline Cognition Cognition Arousal/Alertness: Awake/alert Overall Cognitive Status: Appears within functional limits for tasks assessed Orientation Level: Oriented X4 Sensation/Coordination Sensation Light Touch: Appears Intact (In the UEs) Coordination Gross Motor Movements are Fluid and Coordinated: Yes Fine Motor Movements are Fluid and Coordinated: Yes Extremity Assessment RUE Assessment RUE Assessment: Within Functional Limits (for AROM, strength not tested secondary to precautions.) LUE Assessment LUE Assessment: Within Functional Limits (AROM WFLS, strength not tested secondary to precautions.) Mobility  Bed Mobility Bed Mobility: Yes Rolling Right: 5: Supervision Rolling Right Details (indicate cue type and reason): min v/cs to maintain back precautions, use of handrail Right Sidelying to Sit: 5: Supervision;HOB flat Right Sidelying to Sit Details (indicate cue type and reason): strong use of hand rail, increased time required Transfers Transfers: Yes Sit to Stand: 5: Supervision;With upper extremity  assist;From bed;From toilet Sit to Stand Details (indicate cue type and reason): verbal cues to adhere to back precautions, increased time required  End of Session OT - End of Session Equipment Utilized During Treatment: Other (comment) (Rolling Walker) Activity Tolerance: Patient tolerated treatment well Patient left: in bed;with call bell in reach;with family/visitor present General Behavior During Session: [facility] for tasks performed Cognition: [facility] for tasks performed   Ginette Bradway OTR/L 04/27/2011, 2:54 PM  Pager number [phone]

## 2011-04-28 MED ORDER — ZOLPIDEM TARTRATE 10 MG PO TABS: 10.0000 mg | ORAL_TABLET | Freq: Every evening | ORAL | Status: DC | PRN

## 2011-04-28 NOTE — Progress Notes (Signed)
Pain well controlled with oxycontin and dilaudid and valium  BP 170/64  Pulse 69  Temp(Src) 98 F (36.7 C) (Oral)  Resp 20  SpO2 98%  NVI  Dressing CDI   POD #3 after 4/5 fusion  - d/c home today - f/u 2 weeks - back precautions

## 2011-04-28 NOTE — Discharge Summary (Signed)
NAME:  Derrick Kelley, KADLEC NO.:  [id number]  MEDICAL RECORD NO.:  [id number]  LOCATION:  5031                         FACILITY:  [facility]  PHYSICIAN:  Estill Bamberg, MD      DATE OF BIRTH:  12/31/1960  DATE OF ADMISSION:  04/25/2011 DATE OF DISCHARGE:  04/28/2011                              DISCHARGE SUMMARY   PREOPERATIVE DIAGNOSIS:  L4-5 nonunion causing chronic and severe low back pain, status post L4-S1 posterior spinal fusion.  DISCHARGE DIAGNOSIS:  L4-5 nonunion causing chronic and severe low back pain, status post L4-S1 posterior spinal fusion.  ADMISSION HISTORY:  Briefly, [name] is a pleasant 51 year old male who presented to me with severe and debilitating pain.  The patient is status post an L4-S1 attempted fusion, but he did have a nonunion at L4- 5.  The patient was therefore admitted on April 25, 2011, for operative fixation of his L4-5 nonunion.  HOSPITAL COURSE:  On April 25, 2011, the patient was brought to surgery and underwent the procedure noted above.  The patient tolerated the procedure well and was transferred to recovery in stable condition.  The patient was transferred to the regular floor.  He did have a PCA which was discontinued and the Foley catheter which was discontinued on postop day #1.  The patient was progressively mobilized throughout his hospital stay.  He was neurovascularly intact throughout his hospital stay as well.  On the morning of postop day #3, the patient's pain was well controlled with Dilaudid and 40 mg of OxyContin b.i.d.  The patient was uneventfully discharged home.  DISCHARGE INSTRUCTIONS:  The patient will take Valium, OxyContin, and Dilaudid for pain and spasms.  He will adhere to back precautions at all times.  He will follow up in my office in approximately 2 weeks after his procedure.     Estill Bamberg, MD     MD/MEDQ  D:  04/28/2011  T:  04/28/2011  Job:  [id number]

## 2011-05-06 MED FILL — Sodium Chloride Irrigation Soln 0.9%: Qty: 3000 | Status: AC

## 2011-05-06 MED FILL — Sodium Chloride IV Soln 0.9%: INTRAVENOUS | Qty: 1000 | Status: AC

## 2011-05-06 MED FILL — Heparin Sodium (Porcine) Inj 1000 Unit/ML: INTRAMUSCULAR | Qty: 30 | Status: AC

## 2011-05-13 ENCOUNTER — Other Ambulatory Visit: Payer: Self-pay

## 2011-05-13 MED ORDER — TESTOSTERONE CYPIONATE 200 MG/ML IM SOLN: 200.0000 mg | INTRAMUSCULAR | Status: DC

## 2011-05-13 NOTE — Telephone Encounter (Signed)
Faxed hardcopy to pharmacy. 

## 2011-05-13 NOTE — Telephone Encounter (Signed)
Done hardcopy to robin  

## 2011-08-26 ENCOUNTER — Telehealth: Payer: Self-pay

## 2011-08-26 MED ORDER — ZOLPIDEM TARTRATE 10 MG PO TABS: 10.0000 mg | ORAL_TABLET | Freq: Every evening | ORAL | Status: DC | PRN

## 2011-08-26 NOTE — Telephone Encounter (Signed)
Done hardcopy to robin  

## 2011-08-26 NOTE — Telephone Encounter (Signed)
Pt called requesting refill of Ambien. Pt states that he transferred prescription out of town and now the remaining refills cannot be transferred back. Please advise.

## 2011-08-27 NOTE — Telephone Encounter (Signed)
Faxed hardcopy to [facility] pt. informed

## 2011-09-13 ENCOUNTER — Ambulatory Visit: Payer: BC Managed Care – PPO | Admitting: Internal Medicine

## 2011-09-14 ENCOUNTER — Other Ambulatory Visit: Payer: Self-pay | Admitting: Internal Medicine

## 2011-09-28 ENCOUNTER — Other Ambulatory Visit: Payer: Self-pay | Admitting: Internal Medicine

## 2011-10-09 ENCOUNTER — Other Ambulatory Visit: Payer: Self-pay | Admitting: Internal Medicine

## 2011-10-21 ENCOUNTER — Ambulatory Visit (INDEPENDENT_AMBULATORY_CARE_PROVIDER_SITE_OTHER): Payer: 59 | Admitting: Internal Medicine

## 2011-10-21 ENCOUNTER — Other Ambulatory Visit (INDEPENDENT_AMBULATORY_CARE_PROVIDER_SITE_OTHER): Payer: 59

## 2011-10-21 VITALS — BP 134/90 | HR 85 | Temp 97.0°F | Resp 14 | Ht 68.0 in | Wt 197.8 lb

## 2011-10-21 DIAGNOSIS — I10 Essential (primary) hypertension: Secondary | ICD-10-CM

## 2011-10-21 DIAGNOSIS — R209 Unspecified disturbances of skin sensation: Secondary | ICD-10-CM

## 2011-10-21 DIAGNOSIS — Z79899 Other long term (current) drug therapy: Secondary | ICD-10-CM

## 2011-10-21 DIAGNOSIS — M545 Low back pain, unspecified: Secondary | ICD-10-CM

## 2011-10-21 DIAGNOSIS — D45 Polycythemia vera: Secondary | ICD-10-CM | POA: Insufficient documentation

## 2011-10-21 DIAGNOSIS — Z0000 Encounter for general adult medical examination without abnormal findings: Secondary | ICD-10-CM

## 2011-10-21 DIAGNOSIS — G8929 Other chronic pain: Secondary | ICD-10-CM

## 2011-10-21 DIAGNOSIS — F329 Major depressive disorder, single episode, unspecified: Secondary | ICD-10-CM

## 2011-10-21 DIAGNOSIS — R202 Paresthesia of skin: Secondary | ICD-10-CM

## 2011-10-21 DIAGNOSIS — Z23 Encounter for immunization: Secondary | ICD-10-CM

## 2011-10-21 DIAGNOSIS — E291 Testicular hypofunction: Secondary | ICD-10-CM

## 2011-10-21 DIAGNOSIS — F3289 Other specified depressive episodes: Secondary | ICD-10-CM

## 2011-10-21 LAB — CBC WITH DIFFERENTIAL/PLATELET
Basophils Absolute: 0 10*3/uL (ref 0.0–0.1)
Basophils Relative: 0.3 % (ref 0.0–3.0)
Eosinophils Absolute: 0.1 10*3/uL (ref 0.0–0.7)
HCT: 51.7 % (ref 39.0–52.0)
Hemoglobin: 17.6 g/dL — ABNORMAL HIGH (ref 13.0–17.0)
Lymphs Abs: 1.4 10*3/uL (ref 0.7–4.0)
MCHC: 34 g/dL (ref 30.0–36.0)
Monocytes Relative: 7.4 % (ref 3.0–12.0)
Neutro Abs: 5 10*3/uL (ref 1.4–7.7)
RBC: 5.58 Mil/uL (ref 4.22–5.81)
RDW: 13.3 % (ref 11.5–14.6)

## 2011-10-21 LAB — BASIC METABOLIC PANEL
Calcium: 9.7 mg/dL (ref 8.4–10.5)
Creatinine, Ser: 1.6 mg/dL — ABNORMAL HIGH (ref 0.4–1.5)
GFR: 49 mL/min — ABNORMAL LOW (ref >60.00)
Sodium: 136 mEq/L (ref 135–145)

## 2011-10-21 LAB — URINALYSIS, ROUTINE W REFLEX MICROSCOPIC
Ketones, ur: 15
Specific Gravity, Urine: >=1.03 (ref 1.000–1.030)
Urine Glucose: NEGATIVE
Urobilinogen, UA: 0.2 (ref 0.0–1.0)

## 2011-10-21 LAB — LIPID PANEL
HDL: 30.7 mg/dL — ABNORMAL LOW (ref >39.00)
Total CHOL/HDL Ratio: 7
Triglycerides: 551 mg/dL — ABNORMAL HIGH (ref 0.0–149.0)
VLDL: 110.2 mg/dL — ABNORMAL HIGH (ref 0.0–40.0)

## 2011-10-21 LAB — HEPATIC FUNCTION PANEL
Alkaline Phosphatase: 48 U/L (ref 39–117)
Bilirubin, Direct: 0.1 mg/dL (ref 0.0–0.3)

## 2011-10-21 MED ORDER — SERTRALINE HCL 100 MG PO TABS: ORAL_TABLET | ORAL | Status: DC

## 2011-10-21 MED ORDER — GABAPENTIN 300 MG PO CAPS: 300.0000 mg | ORAL_CAPSULE | Freq: Three times a day (TID) | ORAL | Status: DC

## 2011-10-21 MED ORDER — AMLODIPINE BESYLATE-VALSARTAN 10-160 MG PO TABS: 1.0000 | ORAL_TABLET | Freq: Every day | ORAL | Status: DC

## 2011-10-21 MED ORDER — MELOXICAM 15 MG PO TABS: 15.0000 mg | ORAL_TABLET | Freq: Every day | ORAL | Status: DC

## 2011-10-21 MED ORDER — ZOLPIDEM TARTRATE 10 MG PO TABS: 10.0000 mg | ORAL_TABLET | Freq: Every evening | ORAL | Status: DC | PRN

## 2011-10-21 MED ORDER — ATORVASTATIN CALCIUM 10 MG PO TABS: 10.0000 mg | ORAL_TABLET | Freq: Every day | ORAL | Status: DC

## 2011-10-21 MED ORDER — TESTOSTERONE CYPIONATE 200 MG/ML IM SOLN: 200.0000 mg | INTRAMUSCULAR | Status: DC

## 2011-10-21 NOTE — Assessment & Plan Note (Signed)
Has been out of the azor 10/20, taking 1/2 of old bottle of lis-hct 20/12.5 but not working as well - to change to exforge asd

## 2011-10-21 NOTE — Patient Instructions (Addendum)
You had the flu shot today You will be contacted regarding the referral for: colonoscopy OK to stop the azor as you have (and the lisinopril HCT) Please start the Exforge as prescribed OK to take the gabapentin as prescribed, but watch for any sleepiness with this;  Normally the gabapentin is started at 300 mg at night for 3 nights, then twice per day for 3 days, then three times per day after that Continue all other medications as before, including re-starting the Lipitor 10 mg per day Please go to LAB in the Basement for the blood and/or urine tests to be done today, to include the Vit B12 level You will be contacted by phone if any changes need to be made immediately.  Otherwise, you will receive a letter about your results with an explanation. You will be contacted regarding the referral for: Nerve test for the legs (Nerve conduction study) Please return in 5 months for Nurse Visit to document the blood pressure Please return in 1 year for your yearly visit, or sooner if needed, with Lab testing done 3-5 days before

## 2011-10-21 NOTE — Progress Notes (Signed)
Subjective:    Patient ID: Derrick Kelley, male    DOB: 10/21/1960, 51 y.o.   MRN: [id number]  HPI  Here for wellness and f/u;  Overall doing ok;  Pt denies CP, worsening SOB, DOE, wheezing, orthopnea, PND, worsening LE edema, palpitations, dizziness or syncope.  Pt denies neurological change such as new Headache, facial or extremity weakness.  Pt denies polydipsia, polyuria, or low sugar symptoms. Pt states overall good compliance with treatment and medications, good tolerability, and trying to follow lower cholesterol diet.  Pt denies worsening depressive symptoms, suicidal ideation or panic. No fever, wt loss, night sweats, loss of appetite, or other constitutional symptoms.  Pt states good ability with ADL's, low fall risk, home safety reviewed and adequate, no significant changes in hearing or vision, and occasionally active with exercise.  Unofrtunately did Run out of lipitor as rx was finished after 12 months adn not extended b/c apparently no request to do so, but tolerated ok, wants to restart.  Does also c/o burning discomfort to both feet - instep and dorsal, without any visible changes, no truams, fever, swelling.  Not related to recent lumbar surgury per NS.  Neurontin did seem to help, but only takes irregularly, overall mild to mod, occas severe.    Pt continues to have recurring LBP without change in severity, bowel or bladder change, fever, wt loss,  worsening LE pain/weakness, gait change or falls. Azor is expensive, not covered by insurance. Denies worsening depressive symptoms, suicidal ideation, or panic Past Medical History  Diagnosis Date  . BACK PAIN, CHRONIC 05/11/2009  . Flushing 05/11/2009  . GERD 05/11/2009  . Headache 10/10/2009  . HYPERLIPIDEMIA   . HYPOGONADISM 05/11/2009  . INSOMNIA-SLEEP DISORDER-UNSPEC 10/10/2009    takes Ambien nightly  . POLYCYTHEMIA 05/11/2009  . SCIATICA, RIGHT 09/20/2009  . SINUSITIS- ACUTE-NOS 10/10/2009  . URI 05/11/2009  . HYPERTENSION     takes  Azor daily  . Arthritis   . Chronic back pain     hx buldging disc;  . Hemorrhoids     hx of  . DEPRESSION     takes Zoloft daily   Past Surgical History  Procedure Date  . Left clavicle fracture   . Hx eye surgury 2001 many yrs ago    pterygium-both eyes  . Hx back surgury lumbar fusion 2007  . Hx left shoulder surgury 2002 and August 10, 2009    Dr. Ave Filter, ortho  . Mouth surgery     wisdom teeth extracted    reports that he has never smoked. He does not have any smokeless tobacco history on file. He reports that he does not drink alcohol or use illicit drugs. family history includes Alcohol abuse in his father, maternal aunt, maternal uncle, and other and Cancer in his father, maternal grandmother, and paternal uncle.  There is no history of Anesthesia problems, and Hypotension, and Malignant hyperthermia, and Pseudochol deficiency, . Allergies  Allergen Reactions  . Bupropion Hcl     REACTION: nightmares  . Codeine Hives   Current Outpatient Prescriptions on File Prior to Visit  Medication Sig Dispense Refill  . sertraline (ZOLOFT) 100 MG tablet TAKE 1 & 1/2 TABLETS BY MOUTH DAILY  135 tablet  3  . testosterone cypionate (DEPOTESTOTERONE CYPIONATE) 200 MG/ML injection Inject 1 mL (200 mg total) into the muscle every 14 (fourteen) days. 1 cc biweekly IM (self Injected)  Last injection was on 04/12/11  10 mL  5  . zolpidem (AMBIEN) 10 MG  tablet Take 1 tablet (10 mg total) by mouth at bedtime as needed. For sleep  30 tablet  5  . amLODipine-valsartan (EXFORGE) 10-160 MG per tablet Take 1 tablet by mouth daily.  90 tablet  3  . atorvastatin (LIPITOR) 10 MG tablet Take 1 tablet (10 mg total) by mouth daily.  90 tablet  3  . gabapentin (NEURONTIN) 300 MG capsule Take 1 capsule (300 mg total) by mouth 3 (three) times daily.  90 capsule  5   Review of Systems Constitutional: Negative for diaphoresis, activity change, appetite change and unexpected weight change.  HENT: Negative for  hearing loss, ear pain, facial swelling, mouth sores and neck stiffness.   Eyes: Negative for pain, redness and visual disturbance.  Respiratory: Negative for shortness of breath and wheezing.   Cardiovascular: Negative for chest pain and palpitations.  Gastrointestinal: Negative for diarrhea, blood in stool, abdominal distention and rectal pain.  Genitourinary: Negative for hematuria, flank pain and decreased urine volume.  Musculoskeletal: Negative for myalgias and joint swelling.  Skin: Negative for color change and wound.  Neurological: Negative for syncope  Hematological: Negative for adenopathy.  Psychiatric/Behavioral: Negative for hallucinations, self-injury, decreased concentration and agitation.     Objective:   Physical Exam BP 134/90  Pulse 85  Temp 97 F (36.1 C) (Tympanic)  Resp 14  Ht 5\' 8"  (1.727 m)  Wt 197 lb 12.8 oz (89.721 kg)  BMI 30.08 kg/m2  SpO2 97% Physical Exam  VS noted Constitutional: Pt is oriented to person, place, and time. Appears well-developed and well-nourished.  HENT:  Head: Normocephalic and atraumatic.  Right Ear: External ear normal.  Left Ear: External ear normal.  Nose: Nose normal.  Mouth/Throat: Oropharynx is clear and moist.  Eyes: Conjunctivae and EOM are normal. Pupils are equal, round, and reactive to light.  Neck: Normal range of motion. Neck supple. No JVD present. No tracheal deviation present.  Cardiovascular: Normal rate, regular rhythm, normal heart sounds and intact distal pulses.   Pulmonary/Chest: Effort normal and breath sounds normal.  Abdominal: Soft. Bowel sounds are normal. There is no tenderness.  Musculoskeletal: Normal range of motion. Exhibits no edema.  Lymphadenopathy:  Has no cervical adenopathy.  Neurological: Pt is alert and oriented to person, place, and time. Pt has normal reflexes. No cranial nerve deficit. Motor/gait intact but decrease sens to LT bilat feet Skin: Skin is warm and dry. No rash noted.    Psychiatric:  Has 1+ nervous, not depressed affect,. Behavior is normal.     Assessment & Plan:

## 2011-10-22 ENCOUNTER — Encounter: Payer: Self-pay | Admitting: Internal Medicine

## 2011-10-22 LAB — LDL CHOLESTEROL, DIRECT: Direct LDL: 124.8 mg/dL

## 2011-10-23 ENCOUNTER — Telehealth: Payer: Self-pay

## 2011-10-23 DIAGNOSIS — N182 Chronic kidney disease, stage 2 (mild): Secondary | ICD-10-CM

## 2011-10-23 DIAGNOSIS — E7800 Pure hypercholesterolemia, unspecified: Secondary | ICD-10-CM

## 2011-10-23 NOTE — Telephone Encounter (Signed)
Put order in for labs. 

## 2011-10-24 ENCOUNTER — Encounter: Payer: Self-pay | Admitting: Internal Medicine

## 2011-10-24 DIAGNOSIS — M545 Low back pain, unspecified: Secondary | ICD-10-CM | POA: Insufficient documentation

## 2011-10-24 DIAGNOSIS — R202 Paresthesia of skin: Secondary | ICD-10-CM | POA: Insufficient documentation

## 2011-10-24 DIAGNOSIS — G8929 Other chronic pain: Secondary | ICD-10-CM | POA: Insufficient documentation

## 2011-10-24 NOTE — Assessment & Plan Note (Signed)
prob neuropathy, to start neurontin for pain, confirm with NCS/EMG,  to f/u any worsening symptoms or concerns

## 2011-10-24 NOTE — Assessment & Plan Note (Signed)
To f/u surgury as planned, stable, Continue all other medications as before, may need pain clinic eval and tx

## 2011-10-24 NOTE — Assessment & Plan Note (Signed)
stable overall by hx and exam, most recent data reviewed with pt, and pt to continue medical treatment as before Lab Results  Component Value Date   WBC 7.0 10/21/2011   HGB 17.6* 10/21/2011   HCT 51.7 10/21/2011   PLT 178.0 10/21/2011   GLUCOSE 73 10/21/2011   CHOL 227* 10/21/2011   TRIG 551.0 Triglyceride is over 400; calculations on Lipids are invalid.* 10/21/2011   HDL 30.70* 10/21/2011   LDLDIRECT 124.8 10/21/2011   ALT 20 10/21/2011   AST 33 10/21/2011   NA 136 10/21/2011   K 5.0 10/21/2011   CL 97 10/21/2011   CREATININE 1.6* 10/21/2011   BUN 24* 10/21/2011   CO2 32 10/21/2011   TSH 2.35 10/21/2011   PSA 0.47 10/21/2011   INR 1.05 04/19/2011

## 2011-10-29 ENCOUNTER — Telehealth: Payer: Self-pay | Admitting: Internal Medicine

## 2011-10-29 NOTE — Telephone Encounter (Signed)
Caller: Derrick Kelley/Patient; Phone: [phone]; Reason for Call: Patient returning a call regarding appt.  Please call him back.  Thanks

## 2011-10-30 ENCOUNTER — Telehealth: Payer: Self-pay | Admitting: Internal Medicine

## 2011-10-30 MED ORDER — AMLODIPINE BESYLATE 10 MG PO TABS: 10.0000 mg | ORAL_TABLET | Freq: Every day | ORAL | Status: DC

## 2011-10-30 MED ORDER — IRBESARTAN 150 MG PO TABS: 150.0000 mg | ORAL_TABLET | Freq: Every day | ORAL | Status: DC

## 2011-10-30 NOTE — Telephone Encounter (Signed)
Phone note sent to Dr. Jonny Ruiz about medication.

## 2011-10-30 NOTE — Telephone Encounter (Signed)
Pt was most recently rx exforge, but I gather this is the same issue  Ok to change to amlodipine and avapro (2 erx, but both generic) - done per emr

## 2011-10-30 NOTE — Telephone Encounter (Signed)
Called the patient left detailed message of prescriptions .

## 2011-10-30 NOTE — Telephone Encounter (Signed)
Pt has not taken Azor 10/20 for a couple of months because his insurance won't pay for it.  He needs something generic. [facility] in [place]

## 2011-11-06 ENCOUNTER — Other Ambulatory Visit (INDEPENDENT_AMBULATORY_CARE_PROVIDER_SITE_OTHER): Payer: 59

## 2011-11-06 ENCOUNTER — Encounter: Payer: Self-pay | Admitting: Internal Medicine

## 2011-11-06 DIAGNOSIS — N182 Chronic kidney disease, stage 2 (mild): Secondary | ICD-10-CM

## 2011-11-06 DIAGNOSIS — E7800 Pure hypercholesterolemia, unspecified: Secondary | ICD-10-CM

## 2011-11-06 LAB — LIPID PANEL
HDL: 34.3 mg/dL — ABNORMAL LOW (ref >39.00)
LDL Cholesterol: 82 mg/dL (ref 0–99)
Total CHOL/HDL Ratio: 4
VLDL: 30.8 mg/dL (ref 0.0–40.0)

## 2011-11-06 LAB — BASIC METABOLIC PANEL
Chloride: 99 mEq/L (ref 96–112)
GFR: 57.21 mL/min — ABNORMAL LOW (ref >60.00)
Glucose, Bld: 97 mg/dL (ref 70–99)
Potassium: 4.6 mEq/L (ref 3.5–5.1)
Sodium: 137 mEq/L (ref 135–145)

## 2011-11-11 ENCOUNTER — Ambulatory Visit: Payer: 59 | Admitting: Family Medicine

## 2011-12-03 ENCOUNTER — Other Ambulatory Visit: Payer: Self-pay | Admitting: Orthopedic Surgery

## 2011-12-03 ENCOUNTER — Encounter: Payer: 59 | Admitting: Internal Medicine

## 2011-12-03 DIAGNOSIS — M545 Low back pain: Secondary | ICD-10-CM

## 2011-12-04 ENCOUNTER — Ambulatory Visit
Admission: RE | Admit: 2011-12-04 | Discharge: 2011-12-04 | Disposition: A | Discharge status: Home / Self Care | Payer: 59 | Source: Ambulatory Visit | Attending: Orthopedic Surgery | Admitting: Orthopedic Surgery

## 2011-12-04 DIAGNOSIS — M545 Low back pain: Secondary | ICD-10-CM

## 2011-12-12 ENCOUNTER — Other Ambulatory Visit: Payer: Self-pay | Admitting: Orthopedic Surgery

## 2011-12-16 ENCOUNTER — Telehealth: Payer: Self-pay | Admitting: Internal Medicine

## 2011-12-16 NOTE — Telephone Encounter (Signed)
Left message for pt to call back  °

## 2011-12-16 NOTE — Telephone Encounter (Signed)
Pt is having back surgery-fusion of L4 and 5. He is having this done from an anterior approach. Pt wants to know if Dr. Marina Goodell thinks he will be able to have the Colon at the end of December. Dr. Marina Goodell please advise.

## 2011-12-17 NOTE — Telephone Encounter (Signed)
Is the patient new to me? Why is he having procedure? If routine screening, would cancel tentative plans (previsit and LEC slot) and ask him to call back and schedule when fully recovered and feeling well.

## 2011-12-17 NOTE — Telephone Encounter (Signed)
Pt aware and appts cancelled. Pt to call back when he has recovered from the surgery.

## 2011-12-18 ENCOUNTER — Telehealth: Payer: Self-pay | Admitting: Vascular Surgery

## 2011-12-18 NOTE — Telephone Encounter (Signed)
Message copied by Margaretmary Eddy on Wed Dec 18, 2011 12:25 PM ------      Message from: Phillips Odor      Created: Wed Dec 18, 2011 11:26 AM      Regarding: needs appt. w/ [facility]       This pt. needs new pt. consultation with [facility] prior to ALIF on 01/02/12/ please remind pt. To bring copy of LS spine films/CD ROM to appt.

## 2011-12-23 ENCOUNTER — Encounter (HOSPITAL_COMMUNITY): Payer: Self-pay | Admitting: Pharmacy Technician

## 2011-12-24 ENCOUNTER — Encounter: Payer: Self-pay | Admitting: Vascular Surgery

## 2011-12-25 ENCOUNTER — Ambulatory Visit (INDEPENDENT_AMBULATORY_CARE_PROVIDER_SITE_OTHER): Payer: 59 | Admitting: Vascular Surgery

## 2011-12-25 ENCOUNTER — Encounter (HOSPITAL_COMMUNITY)
Admission: RE | Admit: 2011-12-25 | Discharge: 2011-12-25 | Disposition: A | Discharge status: Home / Self Care | Payer: 59 | Source: Ambulatory Visit | Attending: Orthopedic Surgery | Admitting: Orthopedic Surgery

## 2011-12-25 ENCOUNTER — Other Ambulatory Visit: Payer: Self-pay

## 2011-12-25 ENCOUNTER — Encounter (HOSPITAL_COMMUNITY): Payer: Self-pay

## 2011-12-25 ENCOUNTER — Encounter: Payer: Self-pay | Admitting: Vascular Surgery

## 2011-12-25 VITALS — BP 136/81 | HR 82 | Resp 16 | Ht 68.0 in | Wt 195.0 lb

## 2011-12-25 DIAGNOSIS — IMO0002 Reserved for concepts with insufficient information to code with codable children: Secondary | ICD-10-CM | POA: Insufficient documentation

## 2011-12-25 HISTORY — DX: Pain in unspecified joint: M25.50

## 2011-12-25 LAB — CBC WITH DIFFERENTIAL/PLATELET
Basophils Relative: 0 % (ref 0–1)
Eosinophils Absolute: 0 10*3/uL (ref 0.0–0.7)
Eosinophils Relative: 1 % (ref 0–5)
Hemoglobin: 17.3 g/dL — ABNORMAL HIGH (ref 13.0–17.0)
Lymphs Abs: 1.6 10*3/uL (ref 0.7–4.0)
MCH: 31.9 pg (ref 26.0–34.0)
MCHC: 35.5 g/dL (ref 30.0–36.0)
MCV: 90 fL (ref 78.0–100.0)
Monocytes Relative: 8 % (ref 3–12)
Neutrophils Relative %: 67 % (ref 43–77)
Platelets: 190 10*3/uL (ref 150–400)
RBC: 5.42 MIL/uL (ref 4.22–5.81)

## 2011-12-25 LAB — COMPREHENSIVE METABOLIC PANEL
Albumin: 4.4 g/dL (ref 3.5–5.2)
BUN: 26 mg/dL — ABNORMAL HIGH (ref 6–23)
Calcium: 9.3 mg/dL (ref 8.4–10.5)
GFR calc Af Amer: 64 mL/min — ABNORMAL LOW (ref >90)
Glucose, Bld: 89 mg/dL (ref 70–99)
Total Protein: 7 g/dL (ref 6.0–8.3)

## 2011-12-25 LAB — URINALYSIS, ROUTINE W REFLEX MICROSCOPIC
Bilirubin Urine: NEGATIVE
Hgb urine dipstick: NEGATIVE
Ketones, ur: NEGATIVE mg/dL
Protein, ur: NEGATIVE mg/dL
Urobilinogen, UA: 0.2 mg/dL (ref 0.0–1.0)

## 2011-12-25 LAB — TYPE AND SCREEN
ABO/RH(D): B POS
Antibody Screen: NEGATIVE

## 2011-12-25 LAB — SURGICAL PCR SCREEN: Staphylococcus aureus: NEGATIVE

## 2011-12-25 LAB — PROTIME-INR
INR: 1.07 (ref 0.00–1.49)
Prothrombin Time: 13.8 seconds (ref 11.6–15.2)

## 2011-12-25 NOTE — Pre-Procedure Instructions (Signed)
20 LLEWELLYN ORSINO  12/25/2011   Your procedure is scheduled on:  Thurs, Nov 21 @ 12:00 PM  Report to [facility] at 10:00 AM.  Call this number if you have problems the morning of surgery: [phone]   Remember:   Do not eat food:After Midnight.    Take these medicines the morning of surgery with A SIP OF WATER: Amlodipine(Norvasc),Gabapentin(Neurontin),Pain Pill(if needed), and Sertraline(Zoloft)   Do not wear jewelry.  Do not wear lotions, powders, or colognes. You may wear deodorant.  Men may shave face and neck.  Do not bring valuables to the hospital.  Contacts, dentures or bridgework may not be worn into surgery.  Leave suitcase in the car. After surgery it may be brought to your room.  For patients admitted to the hospital, checkout time is 11:00 AM the day of discharge.   Patients discharged the day of surgery will not be allowed to drive home.    Special Instructions: Shower using CHG 2 nights before surgery and the night before surgery.  If you shower the day of surgery use CHG.  Use special wash - you have one bottle of CHG for all showers.  You should use approximately 1/3 of the bottle for each shower.   Please read over the following fact sheets that you were given: Pain Booklet, Coughing and Deep Breathing, Blood Transfusion Information, MRSA Information and Surgical Site Infection Prevention

## 2011-12-25 NOTE — Progress Notes (Signed)
[facility] [place]  Patient name: Derrick Kelley MRN: [id number] DOB: Apr 21, 1960 Sex: male  REASON FOR CONSULT: evaluate for anterior retroperitoneal exposure of L4-L5. Referred by Dr. Yevette Edwards  HPI: Derrick Kelley is a 51 y.o. male who injured his back lifting a large bag of cement when he was approximately 51 years old. He's had back problems since that time. He underwent a back fusion in 2007 in [place]. He later had to have hardware removed. He is continuing to have low back pain which is aggravated by any activity. The pain is relieved somewhat with pain medication. He has attempted physical therapy but continues to have persistent disabling back pain. He is felt to be a reasonable candidate for ALIF and was referred for evaluation for exposure.  He has had no previous abdominal surgery.  Past Medical History  Diagnosis Date  . BACK PAIN, CHRONIC 05/11/2009  . Flushing 05/11/2009  . GERD 05/11/2009  . Headache 10/10/2009  . HYPERLIPIDEMIA   . HYPOGONADISM 05/11/2009  . INSOMNIA-SLEEP DISORDER-UNSPEC 10/10/2009    takes Ambien nightly  . POLYCYTHEMIA 05/11/2009  . SCIATICA, RIGHT 09/20/2009  . SINUSITIS- ACUTE-NOS 10/10/2009  . URI 05/11/2009  . HYPERTENSION     takes Azor daily  . Arthritis   . Chronic back pain     hx buldging disc;  . Hemorrhoids     hx of  . DEPRESSION     takes Zoloft daily    Family History  Problem Relation Age of Onset  . Alcohol abuse Father   . Cancer Father     prostate  . Alcohol abuse Maternal Aunt   . Alcohol abuse Maternal Uncle   . Cancer Paternal Uncle     prostate  . Cancer Maternal Grandmother     ovarian cancer  . Alcohol abuse Other   . Anesthesia problems Neg Hx   . Hypotension Neg Hx   . Malignant hyperthermia Neg Hx   . Pseudochol deficiency Neg Hx     SOCIAL HISTORY: History  Substance Use Topics  . Smoking status: Never Smoker   . Smokeless tobacco: Never Used  . Alcohol Use: No    Allergies    Allergen Reactions  . Bupropion Hcl     REACTION: nightmares  . Codeine Hives    Current Outpatient Prescriptions  Medication Sig Dispense Refill  . amLODipine (NORVASC) 10 MG tablet Take 10 mg by mouth daily. Take 1/2 tablet per day      . atorvastatin (LIPITOR) 10 MG tablet Take 5 mg by mouth daily.      . cyclobenzaprine (FLEXERIL) 5 MG tablet Take 5 mg by mouth 2 (two) times daily. Take everyday per patient      . gabapentin (NEURONTIN) 300 MG capsule Take 1 capsule (300 mg total) by mouth 3 (three) times daily.  90 capsule  5  . HYDROcodone-acetaminophen (NORCO) 10-325 MG per tablet Take 2 tablets by mouth every 4 (four) hours as needed. For pain      . irbesartan (AVAPRO) 150 MG tablet Take 1 tablet (150 mg total) by mouth daily.  90 tablet  3  . lisinopril-hydrochlorothiazide (PRINZIDE,ZESTORETIC) 20-25 MG per tablet Take 1 tablet by mouth daily.      . sertraline (ZOLOFT) 100 MG tablet TAKE 1 & 1/2 TABLETS BY MOUTH DAILY  135 tablet  3  . testosterone cypionate (DEPOTESTOTERONE CYPIONATE) 200 MG/ML injection Inject 1 mL (200 mg total) into the muscle every 14 (fourteen) days. 1  cc biweekly IM (self Injected)  Last injection was on 04/12/11  10 mL  5  . zolpidem (AMBIEN) 10 MG tablet Take 1 tablet (10 mg total) by mouth at bedtime as needed. For sleep  30 tablet  5  . [DISCONTINUED] amLODipine (NORVASC) 10 MG tablet Take 1 tablet (10 mg total) by mouth daily.  90 tablet  3    REVIEW OF SYSTEMS: Arly.Keller ] denotes positive finding; [  ] denotes negative finding  CARDIOVASCULAR:  [ ]  chest pain   [ ]  chest pressure   [ ]  palpitations   [ ]  orthopnea   [ ]  dyspnea on exertion   [ ]  claudication   [ ]  rest pain   [ ]  DVT   [ ]  phlebitis PULMONARY:   [ ]  productive cough   [ ]  asthma   [ ]  wheezing NEUROLOGIC:   [ ]  weakness  [ ]  paresthesias  [ ]  aphasia  [ ]  amaurosis  [ ]  dizziness HEMATOLOGIC:   [ ]  bleeding problems   [ ]  clotting disorders MUSCULOSKELETAL:  [ ]  joint pain   [ ]  joint  swelling [ ]  leg swelling GASTROINTESTINAL: [ ]   blood in stool  [ ]   hematemesis GENITOURINARY:  [ ]   dysuria  [ ]   hematuria PSYCHIATRIC:  [ ]  history of major depression INTEGUMENTARY:  [ ]  rashes  [ ]  ulcers CONSTITUTIONAL:  [ ]  fever   [ ]  chills  PHYSICAL EXAM: Filed Vitals:   12/25/11 0955  BP: 136/81  Pulse: 82  Resp: 16  Height: 5\' 8"  (1.727 m)  Weight: 195 lb (88.451 kg)  SpO2: 98%   Body mass index is 29.65 kg/(m^2). GENERAL: The patient is a well-nourished male, in no acute distress. The vital signs are documented above. CARDIOVASCULAR: There is a regular rate and rhythm. I do not detect carotid bruits. He has palpable femoral and pedal pulses bilaterally. I could not palpate posterior tibial pulses. He has no significant lower extremity swelling. PULMONARY: There is good air exchange bilaterally without wheezing or rales. ABDOMEN: Soft and non-tender with normal pitched bowel sounds.  MUSCULOSKELETAL: There are no major deformities or cyanosis. NEUROLOGIC: No focal weakness or paresthesias are detected. SKIN: There are no ulcers or rashes noted. PSYCHIATRIC: The patient has a normal affect.  DATA: I have reviewed his CT of the lumbar spine. There is very minimal calcific disease in his aorta.  MEDICAL ISSUES: I think the patient is a good candidate for anterior retroperitoneal exposure of L4-L5. I have reviewed our role in exposure of the spine in order to allow anterior lumbar interbody fusion at the appropriate levels. We have discussed the potential complications of surgery, including but not limited to, arterial or venous injury, thrombosis, or bleeding. We have also discussed the potential risks of wound healing problems, the development of a hernia, nerve injury, leg swelling, or other unpredictable medical problems.  All the patient's questions were answered and they are agreeable to proceed. The surgery scheduled for 01/02/2012.  DICKSON,CHRISTOPHER S [facility] [place] Beeper: [phone]

## 2011-12-25 NOTE — Progress Notes (Signed)
Pt doesn't have a cardiologist  Denies ever having an echo/stress test/heart cath  Dr.James Jonny Ruiz is Medical MD  Denies ekg and cxr being done the past yr

## 2011-12-25 NOTE — Progress Notes (Signed)
12/25/11 1331  OBSTRUCTIVE SLEEP APNEA  Score 4 or greater  Results sent to PCP

## 2011-12-26 ENCOUNTER — Other Ambulatory Visit: Payer: Self-pay | Admitting: Internal Medicine

## 2011-12-26 DIAGNOSIS — G479 Sleep disorder, unspecified: Secondary | ICD-10-CM

## 2011-12-26 NOTE — Telephone Encounter (Signed)
Ok to refer to pulm for abnormal screen for possible osa

## 2011-12-30 ENCOUNTER — Telehealth: Payer: Self-pay

## 2011-12-30 NOTE — Telephone Encounter (Signed)
Patient would like to know why he needs a referral to a pulmonary MD??

## 2011-12-30 NOTE — Telephone Encounter (Signed)
For eval of possible sleep apnea, since the hosp screening was abnormal

## 2011-12-30 NOTE — Telephone Encounter (Signed)
Called left message to call back 

## 2011-12-31 NOTE — Telephone Encounter (Signed)
Called the patient left detailed message of Md instructions on referral.

## 2012-01-01 MED ORDER — CEFAZOLIN SODIUM-DEXTROSE 2-3 GM-% IV SOLR
2.0000 g | INTRAVENOUS | Status: AC
  Administered 2012-01-02: 2 g via INTRAVENOUS
  Filled 2012-01-01: qty 50

## 2012-01-02 ENCOUNTER — Encounter (HOSPITAL_COMMUNITY): Payer: Self-pay | Admitting: Orthopedic Surgery

## 2012-01-02 ENCOUNTER — Encounter (HOSPITAL_COMMUNITY): Payer: Self-pay | Admitting: Anesthesiology

## 2012-01-02 ENCOUNTER — Observation Stay (HOSPITAL_COMMUNITY)
Admission: RE | Admit: 2012-01-02 | Discharge: 2012-01-04 | Disposition: A | Discharge status: Home / Self Care | Payer: 59 | Source: Ambulatory Visit | Attending: Orthopedic Surgery | Admitting: Orthopedic Surgery

## 2012-01-02 ENCOUNTER — Ambulatory Visit (HOSPITAL_COMMUNITY): Payer: 59

## 2012-01-02 ENCOUNTER — Ambulatory Visit (HOSPITAL_COMMUNITY): Payer: 59 | Admitting: Anesthesiology

## 2012-01-02 ENCOUNTER — Encounter (HOSPITAL_COMMUNITY)
Admission: RE | Disposition: A | Discharge status: Home / Self Care | Payer: Self-pay | Source: Ambulatory Visit | Attending: Orthopedic Surgery

## 2012-01-02 ENCOUNTER — Encounter (HOSPITAL_COMMUNITY): Payer: Self-pay | Admitting: *Deleted

## 2012-01-02 DIAGNOSIS — F3289 Other specified depressive episodes: Secondary | ICD-10-CM | POA: Insufficient documentation

## 2012-01-02 DIAGNOSIS — I10 Essential (primary) hypertension: Secondary | ICD-10-CM | POA: Insufficient documentation

## 2012-01-02 DIAGNOSIS — M5137 Other intervertebral disc degeneration, lumbosacral region: Secondary | ICD-10-CM | POA: Insufficient documentation

## 2012-01-02 DIAGNOSIS — Z01818 Encounter for other preprocedural examination: Secondary | ICD-10-CM | POA: Insufficient documentation

## 2012-01-02 DIAGNOSIS — M51379 Other intervertebral disc degeneration, lumbosacral region without mention of lumbar back pain or lower extremity pain: Secondary | ICD-10-CM | POA: Insufficient documentation

## 2012-01-02 DIAGNOSIS — F329 Major depressive disorder, single episode, unspecified: Secondary | ICD-10-CM | POA: Insufficient documentation

## 2012-01-02 DIAGNOSIS — Z01812 Encounter for preprocedural laboratory examination: Secondary | ICD-10-CM | POA: Insufficient documentation

## 2012-01-02 DIAGNOSIS — T84498A Other mechanical complication of other internal orthopedic devices, implants and grafts, initial encounter: Principal | ICD-10-CM | POA: Insufficient documentation

## 2012-01-02 DIAGNOSIS — Y832 Surgical operation with anastomosis, bypass or graft as the cause of abnormal reaction of the patient, or of later complication, without mention of misadventure at the time of the procedure: Secondary | ICD-10-CM | POA: Insufficient documentation

## 2012-01-02 HISTORY — PX: ABDOMINAL EXPOSURE: SHX5708

## 2012-01-02 HISTORY — PX: ANTERIOR LUMBAR FUSION: SHX1170

## 2012-01-02 SURGERY — ANTERIOR LUMBAR FUSION 1 LEVEL
Anesthesia: General | Site: Spine Lumbar | Laterality: Bilateral | Wound class: Clean

## 2012-01-02 MED ORDER — ONDANSETRON HCL 4 MG/2ML IJ SOLN: 4.0000 mg | INTRAMUSCULAR | Status: DC | PRN

## 2012-01-02 MED ORDER — POTASSIUM CHLORIDE IN NACL 20-0.9 MEQ/L-% IV SOLN
INTRAVENOUS | Status: DC
  Administered 2012-01-02: 80 mL/h via INTRAVENOUS
  Administered 2012-01-03: 13:00:00 via INTRAVENOUS
  Filled 2012-01-02 (×6): qty 1000

## 2012-01-02 MED ORDER — SODIUM CHLORIDE 0.9 % IJ SOLN
3.0000 mL | Freq: Two times a day (BID) | INTRAMUSCULAR | Status: DC
  Administered 2012-01-03 (×2): 3 mL via INTRAVENOUS

## 2012-01-02 MED ORDER — ALBUMIN HUMAN 5 % IV SOLN
INTRAVENOUS | Status: DC | PRN
  Administered 2012-01-02: 16:00:00 via INTRAVENOUS

## 2012-01-02 MED ORDER — HYDROMORPHONE HCL PF 1 MG/ML IJ SOLN
INTRAMUSCULAR | Status: AC
  Administered 2012-01-02: 0.5 mg via INTRAVENOUS
  Filled 2012-01-02: qty 1

## 2012-01-02 MED ORDER — TESTOSTERONE CYPIONATE 200 MG/ML IM SOLN
200.0000 mg | INTRAMUSCULAR | Status: DC
Start: 2012-01-03 — End: 2012-01-03

## 2012-01-02 MED ORDER — HYDROCHLOROTHIAZIDE 25 MG PO TABS
25.0000 mg | ORAL_TABLET | Freq: Every day | ORAL | Status: DC
  Administered 2012-01-02 – 2012-01-03 (×2): 25 mg via ORAL
  Filled 2012-01-02 (×2): qty 1

## 2012-01-02 MED ORDER — ALUM & MAG HYDROXIDE-SIMETH 200-200-20 MG/5ML PO SUSP: 30.0000 mL | Freq: Four times a day (QID) | ORAL | Status: DC | PRN

## 2012-01-02 MED ORDER — MORPHINE SULFATE 2 MG/ML IJ SOLN
2.0000 mg | INTRAMUSCULAR | Status: DC | PRN
  Administered 2012-01-02 – 2012-01-03 (×3): 2 mg via INTRAVENOUS
  Administered 2012-01-03: 1 mg via INTRAVENOUS
  Administered 2012-01-03 – 2012-01-04 (×8): 2 mg via INTRAVENOUS
  Filled 2012-01-02 (×12): qty 1

## 2012-01-02 MED ORDER — PROPOFOL 10 MG/ML IV BOLUS
INTRAVENOUS | Status: DC | PRN
  Administered 2012-01-02: 200 mg via INTRAVENOUS

## 2012-01-02 MED ORDER — LIDOCAINE HCL (CARDIAC) 20 MG/ML IV SOLN
INTRAVENOUS | Status: DC | PRN
  Administered 2012-01-02: 30 mg via INTRAVENOUS

## 2012-01-02 MED ORDER — AMLODIPINE BESYLATE 10 MG PO TABS
10.0000 mg | ORAL_TABLET | Freq: Every day | ORAL | Status: DC
  Administered 2012-01-02 – 2012-01-04 (×3): 10 mg via ORAL
  Filled 2012-01-02 (×3): qty 1

## 2012-01-02 MED ORDER — CEFAZOLIN SODIUM 1-5 GM-% IV SOLN
1.0000 g | Freq: Three times a day (TID) | INTRAVENOUS | Status: AC
  Administered 2012-01-02 – 2012-01-03 (×2): 1 g via INTRAVENOUS
  Filled 2012-01-02 (×2): qty 50

## 2012-01-02 MED ORDER — SENNA 8.6 MG PO TABS
1.0000 | ORAL_TABLET | Freq: Two times a day (BID) | ORAL | Status: DC
  Administered 2012-01-02 – 2012-01-04 (×4): 8.6 mg via ORAL
  Filled 2012-01-02 (×5): qty 1

## 2012-01-02 MED ORDER — PHENOL 1.4 % MT LIQD: 1.0000 | OROMUCOSAL | Status: DC | PRN

## 2012-01-02 MED ORDER — IRBESARTAN 150 MG PO TABS
150.0000 mg | ORAL_TABLET | Freq: Every day | ORAL | Status: DC
  Administered 2012-01-02 – 2012-01-04 (×3): 150 mg via ORAL
  Filled 2012-01-02 (×3): qty 1

## 2012-01-02 MED ORDER — MIDAZOLAM HCL 2 MG/2ML IJ SOLN
0.5000 mg | Freq: Once | INTRAMUSCULAR | Status: AC | PRN
  Administered 2012-01-02: 0.5 mg via INTRAVENOUS

## 2012-01-02 MED ORDER — VECURONIUM BROMIDE 10 MG IV SOLR
INTRAVENOUS | Status: DC | PRN
  Administered 2012-01-02: 3 mg via INTRAVENOUS
  Administered 2012-01-02 (×2): 2 mg via INTRAVENOUS
  Administered 2012-01-02: 1 mg via INTRAVENOUS
  Administered 2012-01-02: 3 mg via INTRAVENOUS
  Administered 2012-01-02: 2 mg via INTRAVENOUS

## 2012-01-02 MED ORDER — ARTIFICIAL TEARS OP OINT
TOPICAL_OINTMENT | OPHTHALMIC | Status: DC | PRN
  Administered 2012-01-02: 1 via OPHTHALMIC

## 2012-01-02 MED ORDER — ROCURONIUM BROMIDE 100 MG/10ML IV SOLN
INTRAVENOUS | Status: DC | PRN
  Administered 2012-01-02: 50 mg via INTRAVENOUS

## 2012-01-02 MED ORDER — OXYCODONE HCL 5 MG PO TABS: 5.0000 mg | ORAL_TABLET | Freq: Once | ORAL | Status: DC | PRN

## 2012-01-02 MED ORDER — SODIUM CHLORIDE 0.9 % IV SOLN: 250.0000 mL | INTRAVENOUS | Status: DC

## 2012-01-02 MED ORDER — ATORVASTATIN CALCIUM 10 MG PO TABS
5.0000 mg | ORAL_TABLET | Freq: Every day | ORAL | Status: DC
  Administered 2012-01-02 – 2012-01-03 (×2): 5 mg via ORAL
  Filled 2012-01-02 (×3): qty 0.5

## 2012-01-02 MED ORDER — LACTATED RINGERS IV SOLN
INTRAVENOUS | Status: DC
  Administered 2012-01-02: 12:00:00 via INTRAVENOUS

## 2012-01-02 MED ORDER — MIDAZOLAM HCL 5 MG/5ML IJ SOLN
INTRAMUSCULAR | Status: DC | PRN
  Administered 2012-01-02: 2 mg via INTRAVENOUS

## 2012-01-02 MED ORDER — GABAPENTIN 300 MG PO CAPS
300.0000 mg | ORAL_CAPSULE | Freq: Three times a day (TID) | ORAL | Status: DC
  Administered 2012-01-02 – 2012-01-04 (×4): 300 mg via ORAL
  Filled 2012-01-02 (×7): qty 1

## 2012-01-02 MED ORDER — MIDAZOLAM HCL 2 MG/2ML IJ SOLN
INTRAMUSCULAR | Status: AC
  Administered 2012-01-02: 0.5 mg via INTRAVENOUS
  Filled 2012-01-02: qty 2

## 2012-01-02 MED ORDER — ZOLPIDEM TARTRATE 5 MG PO TABS
10.0000 mg | ORAL_TABLET | Freq: Every day | ORAL | Status: DC
  Administered 2012-01-02 – 2012-01-03 (×2): 10 mg via ORAL
  Filled 2012-01-02 (×2): qty 2

## 2012-01-02 MED ORDER — OXYCODONE HCL 5 MG/5ML PO SOLN: 5.0000 mg | Freq: Once | ORAL | Status: DC | PRN

## 2012-01-02 MED ORDER — POVIDONE-IODINE 7.5 % EX SOLN
Freq: Once | CUTANEOUS | Status: DC
  Filled 2012-01-02: qty 118

## 2012-01-02 MED ORDER — PROMETHAZINE HCL 25 MG/ML IJ SOLN: 6.2500 mg | INTRAMUSCULAR | Status: DC | PRN

## 2012-01-02 MED ORDER — LACTATED RINGERS IV SOLN
INTRAVENOUS | Status: DC | PRN
  Administered 2012-01-02 (×4): via INTRAVENOUS

## 2012-01-02 MED ORDER — HYDROMORPHONE HCL 2 MG PO TABS
2.0000 mg | ORAL_TABLET | ORAL | Status: DC | PRN
  Administered 2012-01-02 – 2012-01-04 (×8): 2 mg via ORAL
  Filled 2012-01-02 (×3): qty 1
  Filled 2012-01-02: qty 2
  Filled 2012-01-02 (×4): qty 1

## 2012-01-02 MED ORDER — THROMBIN 20000 UNITS EX SOLR
OROMUCOSAL | Status: DC | PRN
  Administered 2012-01-02: 15:00:00 via TOPICAL

## 2012-01-02 MED ORDER — SODIUM CHLORIDE 0.9 % IJ SOLN: 3.0000 mL | INTRAMUSCULAR | Status: DC | PRN

## 2012-01-02 MED ORDER — LISINOPRIL 20 MG PO TABS
20.0000 mg | ORAL_TABLET | Freq: Every day | ORAL | Status: DC
  Administered 2012-01-02 – 2012-01-03 (×2): 20 mg via ORAL
  Filled 2012-01-02 (×2): qty 1

## 2012-01-02 MED ORDER — GLYCOPYRROLATE 0.2 MG/ML IJ SOLN
INTRAMUSCULAR | Status: DC | PRN
  Administered 2012-01-02: .6 mg via INTRAVENOUS

## 2012-01-02 MED ORDER — ACETAMINOPHEN 325 MG PO TABS
650.0000 mg | ORAL_TABLET | ORAL | Status: DC | PRN
  Filled 2012-01-02: qty 2

## 2012-01-02 MED ORDER — NEOSTIGMINE METHYLSULFATE 1 MG/ML IJ SOLN
INTRAMUSCULAR | Status: DC | PRN
  Administered 2012-01-02: 5 mg via INTRAVENOUS

## 2012-01-02 MED ORDER — PHENYLEPHRINE HCL 10 MG/ML IJ SOLN
INTRAMUSCULAR | Status: DC | PRN
  Administered 2012-01-02: 40 ug via INTRAVENOUS
  Administered 2012-01-02 (×2): 80 ug via INTRAVENOUS
  Administered 2012-01-02: 40 ug via INTRAVENOUS
  Administered 2012-01-02: 120 ug via INTRAVENOUS

## 2012-01-02 MED ORDER — SERTRALINE HCL 25 MG PO TABS
25.0000 mg | ORAL_TABLET | Freq: Every day | ORAL | Status: DC
  Administered 2012-01-02 – 2012-01-04 (×3): 25 mg via ORAL
  Filled 2012-01-02 (×3): qty 1

## 2012-01-02 MED ORDER — LIDOCAINE HCL 4 % MT SOLN
OROMUCOSAL | Status: DC | PRN
  Administered 2012-01-02: 4 mL via TOPICAL

## 2012-01-02 MED ORDER — ACETAMINOPHEN 650 MG RE SUPP: 650.0000 mg | RECTAL | Status: DC | PRN

## 2012-01-02 MED ORDER — FENTANYL CITRATE 0.05 MG/ML IJ SOLN
INTRAMUSCULAR | Status: DC | PRN
  Administered 2012-01-02 (×3): 50 ug via INTRAVENOUS
  Administered 2012-01-02: 100 ug via INTRAVENOUS
  Administered 2012-01-02 (×9): 50 ug via INTRAVENOUS
  Administered 2012-01-02: 150 ug via INTRAVENOUS
  Administered 2012-01-02 (×3): 50 ug via INTRAVENOUS

## 2012-01-02 MED ORDER — DOCUSATE SODIUM 100 MG PO CAPS
100.0000 mg | ORAL_CAPSULE | Freq: Two times a day (BID) | ORAL | Status: DC
  Administered 2012-01-02 – 2012-01-04 (×4): 100 mg via ORAL
  Filled 2012-01-02 (×5): qty 1

## 2012-01-02 MED ORDER — FENTANYL CITRATE 0.05 MG/ML IJ SOLN
INTRAMUSCULAR | Status: AC
  Filled 2012-01-02: qty 2

## 2012-01-02 MED ORDER — HYDROMORPHONE HCL PF 1 MG/ML IJ SOLN
0.2500 mg | INTRAMUSCULAR | Status: DC | PRN
  Administered 2012-01-02 (×2): 0.5 mg via INTRAVENOUS
  Administered 2012-01-02: 19:00:00 via INTRAVENOUS
  Administered 2012-01-02: 0.5 mg via INTRAVENOUS

## 2012-01-02 MED ORDER — LISINOPRIL-HYDROCHLOROTHIAZIDE 20-25 MG PO TABS: 1.0000 | ORAL_TABLET | Freq: Every day | ORAL | Status: DC

## 2012-01-02 MED ORDER — MEPERIDINE HCL 25 MG/ML IJ SOLN: 6.2500 mg | INTRAMUSCULAR | Status: DC | PRN

## 2012-01-02 MED ORDER — ONDANSETRON HCL 4 MG/2ML IJ SOLN
INTRAMUSCULAR | Status: DC | PRN
  Administered 2012-01-02: 4 mg via INTRAVENOUS

## 2012-01-02 MED ORDER — FENTANYL CITRATE 0.05 MG/ML IJ SOLN
100.0000 ug | Freq: Once | INTRAMUSCULAR | Status: AC
  Administered 2012-01-02: 100 ug via INTRAVENOUS

## 2012-01-02 MED ORDER — MENTHOL 3 MG MT LOZG: 1.0000 | LOZENGE | OROMUCOSAL | Status: DC | PRN

## 2012-01-02 MED ORDER — ZOLPIDEM TARTRATE 5 MG PO TABS
5.0000 mg | ORAL_TABLET | Freq: Every evening | ORAL | Status: DC | PRN
  Filled 2012-01-02: qty 1

## 2012-01-02 SURGICAL SUPPLY — 86 items
AEGIS FIXATION PIN SMOOTH ×6 IMPLANT
APPLIER CLIP 11 MED OPEN (CLIP) ×6
Aegis Screw 5.2x 24mm ×3 IMPLANT
Aegis lumbar plate 17mm ×3 IMPLANT
Aegis screw 5.2x21mm ×9 IMPLANT
BENZOIN TINCTURE PRP APPL 2/3 (GAUZE/BANDAGES/DRESSINGS) ×3 IMPLANT
BLADE SURG 10 STRL SS (BLADE) ×3 IMPLANT
BLADE SURG ROTATE 9660 (MISCELLANEOUS) ×3 IMPLANT
CAGE COUGAR ALIF MED 5 12 (Cage) ×3 IMPLANT
CLIP APPLIE 11 MED OPEN (CLIP) ×4 IMPLANT
CLOTH BEACON ORANGE TIMEOUT ST (SAFETY) ×6 IMPLANT
CLSR STERI-STRIP ANTIMIC 1/2X4 (GAUZE/BANDAGES/DRESSINGS) ×3 IMPLANT
CORDS BIPOLAR (ELECTRODE) ×3 IMPLANT
COVER SURGICAL LIGHT HANDLE (MISCELLANEOUS) ×6 IMPLANT
COVER TABLE BACK 60X90 (DRAPES) ×3 IMPLANT
DERMABOND ADVANCED (GAUZE/BANDAGES/DRESSINGS) ×1
DERMABOND ADVANCED .7 DNX12 (GAUZE/BANDAGES/DRESSINGS) ×2 IMPLANT
DRAPE C-ARM 42X72 X-RAY (DRAPES) ×6 IMPLANT
DRAPE INCISE IOBAN 66X45 STRL (DRAPES) ×3 IMPLANT
DRAPE POUCH INSTRU U-SHP 10X18 (DRAPES) ×3 IMPLANT
DRAPE SURG 17X23 STRL (DRAPES) ×9 IMPLANT
DRSG MEPILEX BORDER 4X12 (GAUZE/BANDAGES/DRESSINGS) ×3 IMPLANT
DRSG MEPILEX BORDER 4X8 (GAUZE/BANDAGES/DRESSINGS) ×3 IMPLANT
DURAPREP 26ML APPLICATOR (WOUND CARE) ×3 IMPLANT
ELECT BLADE 4.0 EZ CLEAN MEGAD (MISCELLANEOUS) ×3
ELECT CAUTERY BLADE 6.4 (BLADE) ×3 IMPLANT
ELECT REM PT RETURN 9FT ADLT (ELECTROSURGICAL) ×3
ELECTRODE BLDE 4.0 EZ CLN MEGD (MISCELLANEOUS) ×2 IMPLANT
ELECTRODE REM PT RTRN 9FT ADLT (ELECTROSURGICAL) ×2 IMPLANT
GAUZE SPONGE 4X4 16PLY XRAY LF (GAUZE/BANDAGES/DRESSINGS) ×3 IMPLANT
GLOVE BIO SURGEON STRL SZ8 (GLOVE) ×3 IMPLANT
GLOVE BIOGEL PI IND STRL 8 (GLOVE) ×6 IMPLANT
GLOVE BIOGEL PI INDICATOR 8 (GLOVE) ×3
GLOVE ECLIPSE 7.5 STRL STRAW (GLOVE) ×3 IMPLANT
GOWN PREVENTION PLUS XLARGE (GOWN DISPOSABLE) ×3 IMPLANT
GOWN STRL NON-REIN LRG LVL3 (GOWN DISPOSABLE) ×9 IMPLANT
HEMOSTAT SURGICEL 2X14 (HEMOSTASIS) IMPLANT
INSERT FOGARTY 61MM (MISCELLANEOUS) IMPLANT
INSERT FOGARTY SM (MISCELLANEOUS) IMPLANT
KIT BASIN OR (CUSTOM PROCEDURE TRAY) ×3 IMPLANT
KIT ROOM TURNOVER OR (KITS) ×6 IMPLANT
LOOP VESSEL MAXI BLUE (MISCELLANEOUS) ×3 IMPLANT
LOOP VESSEL MINI RED (MISCELLANEOUS) ×3 IMPLANT
NEEDLE HYPO 25GX1X1/2 BEV (NEEDLE) ×3 IMPLANT
NEEDLE SPNL 18GX3.5 QUINCKE PK (NEEDLE) ×3 IMPLANT
NS IRRIG 1000ML POUR BTL (IV SOLUTION) ×3 IMPLANT
PACK LAMINECTOMY ORTHO (CUSTOM PROCEDURE TRAY) ×3 IMPLANT
PACK UNIVERSAL I (CUSTOM PROCEDURE TRAY) ×3 IMPLANT
PAD ARMBOARD 7.5X6 YLW CONV (MISCELLANEOUS) ×12 IMPLANT
PUTTY OP1 (Orthopedic Implant) ×3 IMPLANT
SPONGE INTESTINAL PEANUT (DISPOSABLE) ×21 IMPLANT
SPONGE LAP 18X18 X RAY DECT (DISPOSABLE) IMPLANT
SPONGE LAP 4X18 X RAY DECT (DISPOSABLE) ×3 IMPLANT
SPONGE SURGIFOAM ABS GEL 100 (HEMOSTASIS) ×6 IMPLANT
STAPLER VISISTAT 35W (STAPLE) IMPLANT
STRIP CLOSURE SKIN 1/2X4 (GAUZE/BANDAGES/DRESSINGS) ×3 IMPLANT
SURGIFLO TRUKIT (HEMOSTASIS) IMPLANT
SUT MNCRL AB 4-0 PS2 18 (SUTURE) ×3 IMPLANT
SUT PDS AB 1 CTX 36 (SUTURE) ×6 IMPLANT
SUT PROLENE 4 0 RB 1 (SUTURE) ×4
SUT PROLENE 4-0 RB1 .5 CRCL 36 (SUTURE) ×8 IMPLANT
SUT PROLENE 5 0 C 1 24 (SUTURE) IMPLANT
SUT PROLENE 5 0 CC1 (SUTURE) IMPLANT
SUT PROLENE 6 0 C 1 30 (SUTURE) ×3 IMPLANT
SUT PROLENE 6 0 CC (SUTURE) IMPLANT
SUT SILK 0 TIES 10X30 (SUTURE) ×3 IMPLANT
SUT SILK 2 0 TIES 10X30 (SUTURE) ×6 IMPLANT
SUT SILK 2 0SH CR/8 30 (SUTURE) IMPLANT
SUT SILK 3 0 TIES 10X30 (SUTURE) ×6 IMPLANT
SUT SILK 3 0SH CR/8 30 (SUTURE) IMPLANT
SUT VIC AB 0 CT1 27 (SUTURE) ×1
SUT VIC AB 0 CT1 27XBRD ANBCTR (SUTURE) ×2 IMPLANT
SUT VIC AB 1 CT1 27 (SUTURE) ×2
SUT VIC AB 1 CT1 27XBRD ANBCTR (SUTURE) ×4 IMPLANT
SUT VIC AB 1 CTX 36 (SUTURE) ×2
SUT VIC AB 1 CTX36XBRD ANBCTR (SUTURE) ×4 IMPLANT
SUT VIC AB 2-0 CT2 18 VCP726D (SUTURE) ×3 IMPLANT
SUT VIC AB 2-0 CTB1 (SUTURE) ×3 IMPLANT
SUT VIC AB 3-0 SH 27 (SUTURE) ×1
SUT VIC AB 3-0 SH 27X BRD (SUTURE) ×2 IMPLANT
SYR BULB IRRIGATION 50ML (SYRINGE) ×3 IMPLANT
TOWEL OR 17X24 6PK STRL BLUE (TOWEL DISPOSABLE) ×6 IMPLANT
TOWEL OR 17X26 10 PK STRL BLUE (TOWEL DISPOSABLE) ×6 IMPLANT
TRAY FOLEY CATH 14FRSI W/METER (CATHETERS) ×3 IMPLANT
WATER STERILE IRR 1000ML POUR (IV SOLUTION) ×3 IMPLANT
YANKAUER SUCT BULB TIP NO VENT (SUCTIONS) ×3 IMPLANT

## 2012-01-02 NOTE — H&P (View-Only) (Signed)
[facility] [place]  Patient name: Derrick Kelley MRN: [id number] DOB: 03/16/1960 Sex: male  REASON FOR CONSULT: evaluate for anterior retroperitoneal exposure of L4-L5. Referred by Dr. Dumonski  HPI: Derrick Kelley is a 51 y.o. male who injured his back lifting a large bag of cement when he was approximately 51 years old. He's had back problems since that time. He underwent a back fusion in 2007 in [place]. He later had to have hardware removed. He is continuing to have low back pain which is aggravated by any activity. The pain is relieved somewhat with pain medication. He has attempted physical therapy but continues to have persistent disabling back pain. He is felt to be a reasonable candidate for ALIF and was referred for evaluation for exposure.  He has had no previous abdominal surgery.  Past Medical History  Diagnosis Date  . BACK PAIN, CHRONIC 05/11/2009  . Flushing 05/11/2009  . GERD 05/11/2009  . Headache 10/10/2009  . HYPERLIPIDEMIA   . HYPOGONADISM 05/11/2009  . INSOMNIA-SLEEP DISORDER-UNSPEC 10/10/2009    takes Ambien nightly  . POLYCYTHEMIA 05/11/2009  . SCIATICA, RIGHT 09/20/2009  . SINUSITIS- ACUTE-NOS 10/10/2009  . URI 05/11/2009  . HYPERTENSION     takes Azor daily  . Arthritis   . Chronic back pain     hx buldging disc;  . Hemorrhoids     hx of  . DEPRESSION     takes Zoloft daily    Family History  Problem Relation Age of Onset  . Alcohol abuse Father   . Cancer Father     prostate  . Alcohol abuse Maternal Aunt   . Alcohol abuse Maternal Uncle   . Cancer Paternal Uncle     prostate  . Cancer Maternal Grandmother     ovarian cancer  . Alcohol abuse Other   . Anesthesia problems Neg Hx   . Hypotension Neg Hx   . Malignant hyperthermia Neg Hx   . Pseudochol deficiency Neg Hx     SOCIAL HISTORY: History  Substance Use Topics  . Smoking status: Never Smoker   . Smokeless tobacco: Never Used  . Alcohol Use: No    Allergies    Allergen Reactions  . Bupropion Hcl     REACTION: nightmares  . Codeine Hives    Current Outpatient Prescriptions  Medication Sig Dispense Refill  . amLODipine (NORVASC) 10 MG tablet Take 10 mg by mouth daily. Take 1/2 tablet per day      . atorvastatin (LIPITOR) 10 MG tablet Take 5 mg by mouth daily.      . cyclobenzaprine (FLEXERIL) 5 MG tablet Take 5 mg by mouth 2 (two) times daily. Take everyday per patient      . gabapentin (NEURONTIN) 300 MG capsule Take 1 capsule (300 mg total) by mouth 3 (three) times daily.  90 capsule  5  . HYDROcodone-acetaminophen (NORCO) 10-325 MG per tablet Take 2 tablets by mouth every 4 (four) hours as needed. For pain      . irbesartan (AVAPRO) 150 MG tablet Take 1 tablet (150 mg total) by mouth daily.  90 tablet  3  . lisinopril-hydrochlorothiazide (PRINZIDE,ZESTORETIC) 20-25 MG per tablet Take 1 tablet by mouth daily.      . sertraline (ZOLOFT) 100 MG tablet TAKE 1 & 1/2 TABLETS BY MOUTH DAILY  135 tablet  3  . testosterone cypionate (DEPOTESTOTERONE CYPIONATE) 200 MG/ML injection Inject 1 mL (200 mg total) into the muscle every 14 (fourteen) days. 1   cc biweekly IM (self Injected)  Last injection was on 04/12/11  10 mL  5  . zolpidem (AMBIEN) 10 MG tablet Take 1 tablet (10 mg total) by mouth at bedtime as needed. For sleep  30 tablet  5  . [DISCONTINUED] amLODipine (NORVASC) 10 MG tablet Take 1 tablet (10 mg total) by mouth daily.  90 tablet  3    REVIEW OF SYSTEMS: [X ] denotes positive finding; [  ] denotes negative finding  CARDIOVASCULAR:  [ ] chest pain   [ ] chest pressure   [ ] palpitations   [ ] orthopnea   [ ] dyspnea on exertion   [ ] claudication   [ ] rest pain   [ ] DVT   [ ] phlebitis PULMONARY:   [ ] productive cough   [ ] asthma   [ ] wheezing NEUROLOGIC:   [ ] weakness  [ ] paresthesias  [ ] aphasia  [ ] amaurosis  [ ] dizziness HEMATOLOGIC:   [ ] bleeding problems   [ ] clotting disorders MUSCULOSKELETAL:  [ ] joint pain   [ ] joint  swelling [ ] leg swelling GASTROINTESTINAL: [ ]  blood in stool  [ ]  hematemesis GENITOURINARY:  [ ]  dysuria  [ ]  hematuria PSYCHIATRIC:  [ ] history of major depression INTEGUMENTARY:  [ ] rashes  [ ] ulcers CONSTITUTIONAL:  [ ] fever   [ ] chills  PHYSICAL EXAM: Filed Vitals:   12/25/11 0955  BP: 136/81  Pulse: 82  Resp: 16  Height: 5' 8" (1.727 m)  Weight: 195 lb (88.451 kg)  SpO2: 98%   Body mass index is 29.65 kg/(m^2). GENERAL: The patient is a well-nourished male, in no acute distress. The vital signs are documented above. CARDIOVASCULAR: There is a regular rate and rhythm. I do not detect carotid bruits. He has palpable femoral and pedal pulses bilaterally. I could not palpate posterior tibial pulses. He has no significant lower extremity swelling. PULMONARY: There is good air exchange bilaterally without wheezing or rales. ABDOMEN: Soft and non-tender with normal pitched bowel sounds.  MUSCULOSKELETAL: There are no major deformities or cyanosis. NEUROLOGIC: No focal weakness or paresthesias are detected. SKIN: There are no ulcers or rashes noted. PSYCHIATRIC: The patient has a normal affect.  DATA: I have reviewed his CT of the lumbar spine. There is very minimal calcific disease in his aorta.  MEDICAL ISSUES: I think the patient is a good candidate for anterior retroperitoneal exposure of L4-L5. I have reviewed our role in exposure of the spine in order to allow anterior lumbar interbody fusion at the appropriate levels. We have discussed the potential complications of surgery, including but not limited to, arterial or venous injury, thrombosis, or bleeding. We have also discussed the potential risks of wound healing problems, the development of a hernia, nerve injury, leg swelling, or other unpredictable medical problems.  All the patient's questions were answered and they are agreeable to proceed. The surgery scheduled for 01/02/2012.  Dorris Pierre S [facility] [place] Beeper: [phone]    

## 2012-01-02 NOTE — Anesthesia Postprocedure Evaluation (Signed)
  Anesthesia Post-op Note  Patient: Derrick Kelley  Procedure(s) Performed: Procedure(s) (LRB) with comments: ANTERIOR LUMBAR FUSION 1 LEVEL (Bilateral) - Anterior lumbar interbody fusion, lumbar 4-5 with OP-1 and instrumentation. ABDOMINAL EXPOSURE (Bilateral)  Patient Location: PACU  Anesthesia Type:General  Level of Consciousness: awake and alert   Airway and Oxygen Therapy: Patient Spontanous Breathing  Post-op Pain: mild  Post-op Assessment: Post-op Vital signs reviewed, Patient's Cardiovascular Status Stable, Respiratory Function Stable, Patent Airway, No signs of Nausea or vomiting and Pain level controlled  Post-op Vital Signs: stable  Complications: No apparent anesthesia complications

## 2012-01-02 NOTE — Preoperative (Signed)
Beta Blockers   Reason not to administer Beta Blockers:Not Applicable 

## 2012-01-02 NOTE — Transfer of Care (Signed)
Immediate Anesthesia Transfer of Care Note  Patient: Derrick Kelley  Procedure(s) Performed: Procedure(s) (LRB) with comments: ANTERIOR LUMBAR FUSION 1 LEVEL (Bilateral) - Anterior lumbar interbody fusion, lumbar 4-5 with OP-1 and instrumentation. ABDOMINAL EXPOSURE (Bilateral)  Patient Location: PACU  Anesthesia Type:General  Level of Consciousness: awake, alert , oriented and patient cooperative  Airway & Oxygen Therapy: Patient Spontanous Breathing and Patient connected to nasal cannula oxygen  Post-op Assessment: Report given to PACU RN, Post -op Vital signs reviewed and stable and Patient moving all extremities X 4  Post vital signs: Reviewed and stable  Complications: No apparent anesthesia complications

## 2012-01-02 NOTE — H&P (Signed)
PREOPERATIVE H&P  Chief Complaint: low back pain  HPI: Derrick Kelley is a 51 y.o. male who presents with low back pain s/p lumbar fusion  Past Medical History  Diagnosis Date  . BACK PAIN, CHRONIC 05/11/2009    bone on bone;hx of herniated disc  . Flushing 05/11/2009  . HYPERLIPIDEMIA     takes Lipitor daily  . HYPOGONADISM 05/11/2009    takes Testosterone every 14days  . INSOMNIA-SLEEP DISORDER-UNSPEC 10/10/2009    takes Ambien nightly  . POLYCYTHEMIA 05/11/2009  . SCIATICA, RIGHT 09/20/2009  . SINUSITIS- ACUTE-NOS 10/10/2009  . URI 05/11/2009  . Arthritis   . Chronic back pain     hx buldging disc;  . Hemorrhoids     hx of  . HYPERTENSION     takes Avapro,Amlodipine,and Lisinopril daily  . Headache 10/10/2009    cluster;last one about a yr ago  . Joint pain   . Joint pain   . GERD 05/11/2009    but doesn't require meds  . DEPRESSION     takes Zoloft daily   Past Surgical History  Procedure Date  . Left clavicle fracture   . Hx eye surgury 2001 many yrs ago    pterygium-both eyes  . Hx back surgury lumbar fusion 2007  . Hx left shoulder surgury 2002 and August 10, 2009    Dr. Ave Filter, ortho  . Mouth surgery     wisdom teeth extracted  . Back surgery 2013  . Left shoulder surgery    History   Social History  . Marital Status: Married    Spouse Name: N/A    Number of Children: 2  . Years of Education: N/A   Occupational History  . CONSTRUCTION WORKER    Social History Main Topics  . Smoking status: Never Smoker   . Smokeless tobacco: Never Used  . Alcohol Use: No  . Drug Use: No     Comment: former cocaine, none for years  . Sexually Active: Yes   Other Topics Concern  . Not on file   Social History Narrative  . No narrative on file   Family History  Problem Relation Age of Onset  . Alcohol abuse Father   . Cancer Father     prostate  . Alcohol abuse Maternal Aunt   . Alcohol abuse Maternal Uncle   . Cancer Paternal Uncle     prostate  .  Cancer Maternal Grandmother     ovarian cancer  . Alcohol abuse Other   . Anesthesia problems Neg Hx   . Hypotension Neg Hx   . Malignant hyperthermia Neg Hx   . Pseudochol deficiency Neg Hx    Allergies  Allergen Reactions  . Bupropion Hcl     REACTION: nightmares  . Codeine Hives   Prior to Admission medications   Medication Sig Start Date End Date Taking? Authorizing Provider  atorvastatin (LIPITOR) 10 MG tablet Take 5 mg by mouth daily. 10/21/11 10/20/12 Yes Corwin Levins, MD  cyclobenzaprine (FLEXERIL) 5 MG tablet Take 5 mg by mouth 2 (two) times daily. Take everyday per patient   Yes Historical Provider, MD  gabapentin (NEURONTIN) 300 MG capsule Take 1 capsule (300 mg total) by mouth 3 (three) times daily. 10/21/11 10/20/12 Yes Corwin Levins, MD  HYDROcodone-acetaminophen [facility]) 10-325 MG per tablet Take 2 tablets by mouth every 4 (four) hours as needed. For pain   Yes Historical Provider, MD  irbesartan (AVAPRO) 150 MG tablet Take 1 tablet (150 mg  total) by mouth daily. 10/30/11  Yes Corwin Levins, MD  sertraline (ZOLOFT) 100 MG tablet TAKE 1 & 1/2 TABLETS BY MOUTH DAILY 10/21/11  Yes Corwin Levins, MD  testosterone cypionate (DEPOTESTOTERONE CYPIONATE) 200 MG/ML injection Inject 1 mL (200 mg total) into the muscle every 14 (fourteen) days. 1 cc biweekly IM (self Injected)  Last injection was on 04/12/11 10/21/11  Yes Corwin Levins, MD  zolpidem (AMBIEN) 10 MG tablet Take 1 tablet (10 mg total) by mouth at bedtime as needed. For sleep 10/21/11  Yes Corwin Levins, MD  amLODipine (NORVASC) 10 MG tablet Take 10 mg by mouth daily. Take 1/2 tablet per day 10/30/11   Corwin Levins, MD  lisinopril-hydrochlorothiazide (PRINZIDE,ZESTORETIC) 20-25 MG per tablet Take 1 tablet by mouth daily. 11/25/11   Historical Provider, MD     All other systems have been reviewed and were otherwise negative with the exception of those mentioned in the HPI and as above.  Physical Exam: There were no vitals filed for this  visit.  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

## 2012-01-02 NOTE — Anesthesia Preprocedure Evaluation (Addendum)
Anesthesia Evaluation  Patient identified by MRN, date of birth, ID band Patient awake    Reviewed: Allergy & Precautions, H&P , NPO status , Patient's Chart, lab work & pertinent test results  History of Anesthesia Complications Negative for: history of anesthetic complications  Airway Mallampati: II TM Distance: >3 FB Neck ROM: Full    Dental No notable dental hx. (+) Dental Advisory Given and Teeth Intact   Pulmonary neg pulmonary ROS,  breath sounds clear to auscultation  Pulmonary exam normal       Cardiovascular hypertension, Pt. on medications Rhythm:Regular Rate:Normal     Neuro/Psych PSYCHIATRIC DISORDERS Depression Chronic back pain to L leg: narcotics hourly    GI/Hepatic negative GI ROS, Neg liver ROS,   Endo/Other  negative endocrine ROS  Renal/GU negative Renal ROS     Musculoskeletal   Abdominal (+) - obese,   Peds  Hematology   Anesthesia Other Findings   Reproductive/Obstetrics                          Anesthesia Physical Anesthesia Plan  ASA: II  Anesthesia Plan: General   Post-op Pain Management:    Induction: Intravenous  Airway Management Planned: Oral ETT  Additional Equipment:   Intra-op Plan:   Post-operative Plan: Extubation in OR  Informed Consent: I have reviewed the patients History and Physical, chart, labs and discussed the procedure including the risks, benefits and alternatives for the proposed anesthesia with the patient or authorized representative who has indicated his/her understanding and acceptance.   Dental advisory given  Plan Discussed with: CRNA and Surgeon  Anesthesia Plan Comments: (Plan routine monitors, GETA)        Anesthesia Quick Evaluation

## 2012-01-02 NOTE — Anesthesia Procedure Notes (Signed)
Procedure Name: Intubation Date/Time: 01/02/2012 1:59 PM Performed by: Lovie Chol Pre-anesthesia Checklist: Patient identified, Emergency Drugs available, Suction available, Patient being monitored and Timeout performed Patient Re-evaluated:Patient Re-evaluated prior to inductionOxygen Delivery Method: Circle system utilized Preoxygenation: Pre-oxygenation with 100% oxygen Intubation Type: IV induction Ventilation: Mask ventilation without difficulty and Oral airway inserted - appropriate to patient size Laryngoscope Size: Miller and 2 Grade View: Grade II Tube type: Oral Tube size: 7.5 mm Number of attempts: 1 Airway Equipment and Method: Patient positioned with wedge pillow and LTA kit utilized Placement Confirmation: ETT inserted through vocal cords under direct vision,  positive ETCO2 and breath sounds checked- equal and bilateral Secured at: 22 cm Tube secured with: Tape Dental Injury: Teeth and Oropharynx as per pre-operative assessment

## 2012-01-02 NOTE — Interval H&P Note (Signed)
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 After consideration of risks, benefits and other options for treatment, the patient has consented to anterior retroperitoneal exposure of L4-L5.  The patient's history has been reviewed, patient examined, no change in status, stable for surgery.  I have reviewed the patient's chart and labs.  Questions were answered to the patient's satisfaction.     Gio Janoski S

## 2012-01-03 DIAGNOSIS — M5126 Other intervertebral disc displacement, lumbar region: Secondary | ICD-10-CM

## 2012-01-03 MED ORDER — TESTOSTERONE CYPIONATE 200 MG/ML IM SOLN: 200.0000 mg | INTRAMUSCULAR | Status: DC

## 2012-01-03 MED ORDER — HYDROMORPHONE HCL 2 MG PO TABS: 2.0000 mg | ORAL_TABLET | ORAL | Status: DC | PRN

## 2012-01-03 MED ORDER — CYCLOBENZAPRINE HCL 10 MG PO TABS: 10.0000 mg | ORAL_TABLET | Freq: Three times a day (TID) | ORAL | Status: DC | PRN

## 2012-01-03 MED ORDER — CYCLOBENZAPRINE HCL 10 MG PO TABS
10.0000 mg | ORAL_TABLET | Freq: Three times a day (TID) | ORAL | Status: DC | PRN
  Administered 2012-01-03 – 2012-01-04 (×2): 10 mg via ORAL
  Filled 2012-01-03 (×2): qty 1

## 2012-01-03 MED FILL — Heparin Sodium (Porcine) Inj 1000 Unit/ML: INTRAMUSCULAR | Qty: 30 | Status: AC

## 2012-01-03 MED FILL — Sodium Chloride IV Soln 0.9%: INTRAVENOUS | Qty: 1000 | Status: AC

## 2012-01-03 MED FILL — Sodium Chloride Irrigation Soln 0.9%: Qty: 3000 | Status: AC

## 2012-01-03 NOTE — Op Note (Signed)
NAME:  Derrick Kelley, DELO NO.:  [id number]  MEDICAL RECORD NO.:  [id number]  LOCATION:  [id number]                        FACILITY:  [facility]  PHYSICIAN:  Estill Bamberg, MD      DATE OF BIRTH:  [birth year]  DATE OF PROCEDURE:  01/02/2012 DATE OF DISCHARGE:                              OPERATIVE REPORT   PREOPERATIVE DIAGNOSIS:  L4-5, nonunion.  POSTOPERATIVE DIAGNOSIS:  L4-5, nonunion.  PROCEDURE: 1. Anterior lumbar interbody fusion at L4-5. 2. Placement of anterior instrumentation, L4-5. 3. Insertion of interbody device x1. 4. Intraoperative use of fluoroscopy.  SURGEON:  Estill Bamberg, MD.  ASSISTANTJason Coop, PA-C.  ANESTHESIA:  General endotracheal anesthesia.  COMPLICATIONS:  None.  DISPOSITION:  Stable.  ESTIMATED BLOOD LOSS:  Minimal.  EXPOSURE SURGEON:  Di Kindle. Edilia Bo, M.D.  INDICATIONS FOR PROCEDURE:  Briefly, Mr. Derrick Kelley is a pleasant 51 year old male, who many years ago did have instrumentation placed at the L4-5 level.  The patient was noted to have severe pain and was evaluated by me.  I did perform an instrumented posterior spinal fusion.  The patient, however, today going to have a nonunion.  The patient is going to have pain thought to be from the nonunion.  We therefore did have a discussion regarding going forward with an anterior lumbar interbody fusion using 0 P1 with anterior instrumentation.  The patient fully understood the risks and limitations of the procedure as outlined in my preoperative note.  OPERATIVE DETAILS:  On January 02, 2012 the patient was brought to surgery and general endotracheal anesthesia was administered.  The patient was placed supine on a hospital bed.  All bony prominences were meticulously padded.  A transverse incision was made to the left of the midline.  The exposure was obtained by Dr. Waverly Ferrari.  Of note, Leatrice Jewels did function as 1st assistant for the exposure. Once the  L4-5 interspace was identified, did use a #10 knife to perform an annulotomy anteriorly.  I then proceeded with using a series of curettes to perform a thorough complete diskectomy.  I did prepare the endplates appropriately.  I then placed a series of trials and did feel that a 12 mm medium 5 degree interbody trial would be the most appropriate fit.  The trial was then packed with 0 P1 and tamped into position in the usual fashion.  I did note an excellent press fit of the implant.  I then chose a 17 mm Aegis plate.  This was placed over the anterior cervical spine.  I did use AP and lateral fluoroscopic views to confirm appropriate positioning of the plate.  I then placed a 5.2 mm screws, 21 mm was used x3 in addition to a 24 mm screw.  I did note excellent purchase of each of the screws.  I was very pleased with the final AP and lateral radiographs.  The wound was then copiously irrigated.  The fascia was then closed using #1 PDS.  The subcutaneous layer was then closed using 2-0 Vicryl and the skin was closed using 3-0 Monocryl.  All instrument counts were correct at the termination of the procedure.  Benzoin and Steri-Strips were applied followed by  sterile dressing.  Of note, Leatrice Jewels was my assistant throughout the procedure and aided in essential retraction and suctioning required throughout the surgery.     Estill Bamberg, MD     MD/MEDQ  D:  01/02/2012  T:  01/03/2012  Job:  [id number]

## 2012-01-03 NOTE — Op Note (Signed)
NAME: Derrick Kelley   MRN: [id number] DOB: 08/27/1960    DATE OF OPERATION: 01/03/2012  PREOP DIAGNOSIS: degenerative disc disease at L4-L5  POSTOP DIAGNOSIS: same  PROCEDURE: anterior retroperitoneal exposure of L4-L5  SURGEON: Di Kindle. Edilia Bo, MD, FACS  ASSIST:   ANESTHESIA: Gen.   EBL: minimal  INDICATIONS: Derrick Kelley is a 51 y.o. male with degenerative disc disease at L4-L5  FINDINGS: degenerative disc disease L4-L5  TECHNIQUE: The patient was brought to the operating room and received a general anesthetic. The abdomen was prepped and draped in the usual sterile fashion. The level of the L4-L5 disc had been marked. A transverse incision was made at this level and the dissection carried down to the subcutaneous tissue to the anterior rectus fascia. The fascia was divided transversely extending across to the right rectus abdominis and then across the left rectus abdominis to the linea semilunaris. The anterior rectus sheath was mobilized off of the rectus abdominis muscle both superiorly and inferiorly and then the muscle was dissected free circumferentially. Initially the muscle was retracted medially and the posterior rectus sheath was sharply incised. The retroperitoneal dissection was begun along exposure of the psoas muscle and then medially onto the iliac artery which was fully mobilized proximally and distally so that it could be retracted medially. This allowed exposure of the iliac vein. There were 2 iliolumbar veins these were dissected free and then ligated and triply clipped and divided. This allowed mobilization of the iliac vein and iliac artery to the patient's right for exposure of the L4-L5 disc. The Thompson retractor was placed. The reverse retractor was placed on the right side of the disc and then on the left side of the disc allowing adequate exposure of the L4-L5 disc. X-ray was used to confirm the correct position. The remainder of the dictation is as  noted by Dr. Reva Bores.  Waverly Ferrari, MD, FACS [facility]  DATE OF DICTATION:   01/03/2012

## 2012-01-03 NOTE — Evaluation (Addendum)
Physical Therapy Evaluation Patient Details Name: Derrick Kelley MRN: [id number] DOB: September 08, 1960 Today's Date: 01/03/2012 Time: 7829-5621 PT Time Calculation (min): 27 min  PT Assessment / Plan / Recommendation Clinical Impression  Pt. admitted 01/02/12 for  ALIF and removal hardware for nonunion PLIF. Pt. tolerated ambulation well. Pain10 , had been premedicated.  Pt. has DME. Pt. will benefit from PT while  in hospital to improve safety and functional mobility.    PT Assessment  Patient needs continued PT services    Follow Up Recommendations  No PT follow up    Does the patient have the potential to tolerate intense rehabilitation      Barriers to Discharge        Equipment Recommendations  None recommended by PT    Recommendations for Other Services     Frequency Min 5X/week    Precautions / Restrictions Precautions Precautions: Back Precaution Comments: reviewed back precautions Required Braces or Orthoses: Spinal Brace Spinal Brace: Thoracolumbosacral orthotic Restrictions Weight Bearing Restrictions: No Other Position/Activity Restrictions: Pt. satated MD said he did not need to wear if just walking to BR-- no orders as such in chart.   Pertinent Vitals/Pain At rest 4/10, walking 10/10 was premedicated.      Mobility  Bed Mobility Bed Mobility: Rolling Left Rolling Left: 4: Min guard Left Sidelying to Sit: 4: Min assist Details for Bed Mobility Assistance: verbal cues to roll. Pt was about to sit from supine Transfers Transfers: Sit to Stand;Stand to Sit Sit to Stand: 4: Min guard;From bed;From chair/3-in-1;With armrests;With upper extremity assist Stand to Sit: To toilet;To chair/3-in-1;4: Min guard;With armrests;With upper extremity assist Details for Transfer Assistance: VC for pushing from bed with UEs.  Ambulation/Gait Ambulation/Gait Assistance: 4: Min assist Ambulation Distance (Feet): 50 Feet Assistive device: Rolling walker Ambulation/Gait  Assistance Details: pt initially had flexed knees Gait Pattern: Decreased stride length Gait velocity: decreased    Shoulder Instructions     Exercises     PT Diagnosis: Difficulty walking;Acute pain  PT Problem List: Decreased activity tolerance;Decreased mobility;Decreased safety awareness;Decreased knowledge of use of DME;Decreased knowledge of precautions PT Treatment Interventions: DME instruction;Gait training;Functional mobility training;Therapeutic activities;Patient/family education   PT Goals Acute Rehab PT Goals PT Goal Formulation: With patient/family Time For Goal Achievement: 01/10/12 Potential to Achieve Goals: Good Pt will Roll Supine to Right Side: Independently PT Goal: Rolling Supine to Right Side - Progress: Goal set today Pt will Roll Supine to Left Side: Independently PT Goal: Rolling Supine to Left Side - Progress: Goal set today Pt will go Supine/Side to Sit: with modified independence PT Goal: Supine/Side to Sit - Progress: Goal set today Pt will go Sit to Supine/Side: with modified independence PT Goal: Sit to Supine/Side - Progress: Goal set today Pt will go Sit to Stand: with supervision PT Goal: Sit to Stand - Progress: Goal set today Pt will go Stand to Sit: with supervision PT Goal: Stand to Sit - Progress: Goal set today Pt will Ambulate: >150 feet;with supervision;with rolling walker PT Goal: Ambulate - Progress: Goal set today  Visit Information  Last PT Received On: 01/03/12 Assistance Needed: +1    Subjective Data  Subjective: I have been through this before but not from the front. My abdomen is swollen Patient Stated Goal: to walk,  get through this   Prior Functioning  Home Living Lives With: Spouse Available Help at Discharge: Family Type of Home: House Home Access: Stairs to enter [employer] of Steps: 1 Home Layout: One  level Bathroom Shower/Tub: Walk-in shower;Door Bathroom Toilet: Handicapped height Home Adaptive  Equipment: Bedside commode/3-in-1;Walker - rolling;Shower chair with back Prior Function Level of Independence: Independent Able to Take Stairs?: Yes Driving: Yes Vocation:  (not currently working) Musician: No difficulties Dominant Hand: Right    Cognition  Overall Cognitive Status: Appears within functional limits for tasks assessed/performed Arousal/Alertness: Awake/alert Orientation Level: Appears intact for tasks assessed Behavior During Session: [facility] for tasks performed    Extremity/Trunk Assessment Right Upper Extremity Assessment RUE ROM/Strength/Tone: Within functional levels RUE Coordination: WFL - gross/fine motor Left Upper Extremity Assessment LUE ROM/Strength/Tone: Within functional levels LUE Coordination: WFL - gross/fine motor (x3 surg PTA) Right Lower Extremity Assessment RLE ROM/Strength/Tone: WFL for tasks assessed RLE Sensation: WFL - Light Touch Left Lower Extremity Assessment LLE ROM/Strength/Tone: WFL for tasks assessed LLE Sensation: WFL - Light Touch Trunk Assessment Trunk Assessment:  (x2 previous back surgery)   Balance    End of Session PT - End of Session Equipment Utilized During Treatment:  (TLSO) Activity Tolerance: Patient tolerated treatment well Patient left: in chair;with call bell/phone within reach;with family/visitor present  GP Functional Assessment Tool Used: clinical judgement. Functional Limitation: Mobility: Walking and moving around Mobility: Walking and Moving Around Current Status [phone]): At least 20 percent but less than 40 percent impaired, limited or restricted Mobility: Walking and Moving Around Goal Status [phone]): At least 1 percent but less than 20 percent impaired, limited or restricted   Rada Hay 01/03/2012, 9:26 AM  [phone]

## 2012-01-03 NOTE — Progress Notes (Signed)
Patient reporting moderate abdominal pain.  BP 136/70  Pulse 82  Temp 98.3 F (36.8 C) (Oral)  Resp 16  SpO2 99%  NVI Dressing CDI  POD #1 after L4/5 ALIF - continue dilaudid for pain, flexeril 10 mg tabs for spasms - up with PT with a brace - back precautions at all times - likely d/c home tomorrow vs. Today - f/u 2 weeks

## 2012-01-03 NOTE — Progress Notes (Signed)
Utilization review completed. Kimesha Claxton, RN, BSN. 

## 2012-01-03 NOTE — Progress Notes (Signed)
VASCULAR PROGRESS NOTE  SUBJECTIVE: No nausea. Pain adequately controlled.  PHYSICAL EXAM: Filed Vitals:   01/02/12 1945 01/02/12 2000 01/02/12 2040 01/03/12 0653  BP: 139/72  148/80 136/70  Pulse: 89 86 87 82  Temp:  98.2 F (36.8 C) 97.8 F (36.6 C) 98.3 F (36.8 C)  TempSrc:   Oral Oral  Resp: 17 12 16 16   SpO2: 100% 100% 100% 99%   Dressing dry. Palpable left dorsalis pedis pulse. Abdomen soft nontender.  LABS: Lab Results  Component Value Date   WBC 6.5 12/25/2011   HGB 17.3* 12/25/2011   HCT 48.8 12/25/2011   MCV 90.0 12/25/2011   PLT 190 12/25/2011   ASSESSMENT/PLAN: 1. 1 Day Post-Op s/p: anterior retroperitoneal exposure of L4-L5. 2. Doing well. Will be available as needed.  Cari Caraway Beeper: [phone] 01/03/2012

## 2012-01-03 NOTE — Evaluation (Signed)
Occupational Therapy Evaluation Patient Details Name: Derrick Kelley MRN: [id number] DOB: 06/17/1960 Today's Date: 01/03/2012 Time: 4098-1191 OT Time Calculation (min): 27 min  OT Assessment / Plan / Recommendation Clinical Impression  51 yo male s/p ALIF L4-5 that could benefit from skilled OT acutely. Ot to follow    OT Assessment  Patient needs continued OT Services    Follow Up Recommendations  Home health OT    Barriers to Discharge      Equipment Recommendations  None recommended by PT    Recommendations for Other Services    Frequency  Min 2X/week    Precautions / Restrictions Precautions Precautions: Back Precaution Comments: reviewed back precautions Required Braces or Orthoses: Spinal Brace Spinal Brace: Thoracolumbosacral orthotic Restrictions Weight Bearing Restrictions: No Other Position/Activity Restrictions: Pt. satated MD said he did not need to wear if just walking to BR-- no orders as such in chart.   Pertinent Vitals/Pain 10 out 10 pain Pt premedicated with iv pain medication    ADL  Eating/Feeding: Set up Where Assessed - Eating/Feeding: Chair Grooming: Teeth care;Wash/dry face;Modified independent Where Assessed - Grooming: Unsupported sitting Lower Body Dressing:  (patient unable to cross bil LE) Toilet Transfer: Min Pension scheme manager Method: Sit to Barista: Raised toilet seat with arms (or 3-in-1 over toilet) Toileting - Clothing Manipulation and Hygiene: Minimal assistance Where Assessed - Toileting Clothing Manipulation and Hygiene: Sit to stand from 3-in-1 or toilet Equipment Used: Back brace;Rolling walker Transfers/Ambulation Related to ADLs: Pt ambulating min guard (A) with RW with decr gait speed. Pt with 10 out 10 pain with mobility ADL Comments: pt with previous back surg and familiar with don of brace from pre op. Pt exiting bed on left side per patient request and pt with incr discomfort. Pt states "i  think I will try the right side next time." Pt attempting to void without success and passing gas only. Pt positioned in chair for oral hygiene. pt's wife present throughout session. Pt recalled 2 out 3 precautions from pre op. Pt provided hand out of precautions.    OT Diagnosis: Generalized weakness;Acute pain  OT Problem List: Decreased strength;Impaired balance (sitting and/or standing);Decreased activity tolerance;Decreased knowledge of use of DME or AE;Decreased knowledge of precautions;Pain OT Treatment Interventions: Self-care/ADL training;DME and/or AE instruction;Therapeutic activities;Patient/family education;Balance training   OT Goals Acute Rehab OT Goals OT Goal Formulation: With patient/family Time For Goal Achievement: 01/17/12 Potential to Achieve Goals: Good ADL Goals Pt Will Perform Grooming: with modified independence;Standing at sink ADL Goal: Grooming - Progress: Goal set today Pt Will Perform Lower Body Bathing: with modified independence;Sit to stand from chair;with adaptive equipment ADL Goal: Lower Body Bathing - Progress: Goal set today Pt Will Perform Lower Body Dressing: with modified independence;Sit to stand from chair;with adaptive equipment ADL Goal: Lower Body Dressing - Progress: Goal set today Pt Will Transfer to Toilet: with modified independence;Ambulation;with DME;3-in-1 ADL Goal: Toilet Transfer - Progress: Goal set today Miscellaneous OT Goals Miscellaneous OT Goal #1: Pt will complete bed mobility MOD i as precursor to adls with 100% correct back precautions OT Goal: Miscellaneous Goal #1 - Progress: Goal set today  Visit Information  Last OT Received On: 01/03/12 Assistance Needed: +1 PT/OT Co-Evaluation/Treatment: Yes    Subjective Data  Subjective: "I spasmed in that bed all night" "it feels good to get up finally"-  Patient Stated Goal: to go home   Prior Functioning     Home Living Lives With: Spouse Available Help at  Discharge:  Family Type of Home: House Home Access: Stairs to enter [employer] of Steps: 1 Home Layout: One level Bathroom Shower/Tub: Walk-in shower;Door Bathroom Toilet: Handicapped height Home Adaptive Equipment: Bedside commode/3-in-1;Walker - rolling;Shower chair with back Prior Function Level of Independence: Independent Able to Take Stairs?: Yes Driving: Yes Vocation:  (not currently working) Musician: No difficulties Dominant Hand: Right         Vision/Perception     Cognition  Overall Cognitive Status: Appears within functional limits for tasks assessed/performed Arousal/Alertness: Awake/alert Orientation Level: Appears intact for tasks assessed Behavior During Session: [facility] for tasks performed    Extremity/Trunk Assessment Right Upper Extremity Assessment RUE ROM/Strength/Tone: Within functional levels RUE Coordination: WFL - gross/fine motor Left Upper Extremity Assessment LUE ROM/Strength/Tone: Within functional levels LUE Coordination: WFL - gross/fine motor (x3 surg PTA) Right Lower Extremity Assessment RLE ROM/Strength/Tone: WFL for tasks assessed RLE Sensation: WFL - Light Touch Left Lower Extremity Assessment LLE ROM/Strength/Tone: WFL for tasks assessed LLE Sensation: WFL - Light Touch Trunk Assessment Trunk Assessment:  (x2 previous back surgery)     Mobility Bed Mobility Bed Mobility: Rolling Left Rolling Left: 4: Min guard Left Sidelying to Sit: 4: Min assist Details for Bed Mobility Assistance: verbal cues to roll. Pt was about to sit from supine Transfers Sit to Stand: 4: Min guard;From bed;From chair/3-in-1;With armrests;With upper extremity assist Stand to Sit: To toilet;To chair/3-in-1;4: Min guard;With armrests;With upper extremity assist Details for Transfer Assistance: VC for pushing from bed with UEs.      Shoulder Instructions     Exercise     Balance     End of Session OT - End of Session Activity  Tolerance: Patient limited by pain Patient left: in chair;with call bell/phone within reach;with family/visitor present Nurse Communication: Mobility status;Precautions  GO     Lucile Shutters 01/03/2012, 9:26 AM Pager: [phone]

## 2012-01-04 MED ORDER — CYCLOBENZAPRINE HCL 10 MG PO TABS: 10.0000 mg | ORAL_TABLET | Freq: Three times a day (TID) | ORAL | Status: DC | PRN

## 2012-01-04 NOTE — Progress Notes (Signed)
Occupational Therapy Treatment Patient Details Name: Derrick Kelley MRN: [id number] DOB: May 09, 1960 Today's Date: 01/04/2012 Time: 7829-5621 OT Time Calculation (min): 10 min  OT Assessment / Plan / Recommendation Comments on Treatment Session Pt. eager to return home.  Reports he does not need further education with brace application.  Wife will assist him with ADLs    Follow Up Recommendations  Supervision - Intermittent;Supervision/Assistance - 24 hour;No OT follow up    Barriers to Discharge       Equipment Recommendations  None recommended by PT    Recommendations for Other Services    Frequency Min 2X/week   Plan Discharge plan needs to be updated    Precautions / Restrictions Precautions Precautions: Back Precaution Comments: Pt. able to state independently Restrictions Weight Bearing Restrictions: No   Pertinent Vitals/Pain     ADL  Lower Body Dressing: Maximal assistance Where Assessed - Lower Body Dressing: Supported sit to stand ADL Comments: Pt. unable to cross ankles over knees for LB ADLs.  He is not interested in AE for LB ADLs and reports that wife assisted him last time, and will do so this time.  She reports she did this x ~3 weeks last time until he was able to bend.  Instructed them that he will be on precautions for 8-12 weeks - MD discretion, and that wife needs to assist him until MD allows him to bend.  They verbalized understanding     OT Diagnosis:    OT Problem List:   OT Treatment Interventions:     OT Goals Miscellaneous OT Goals OT Goal: Miscellaneous Goal #1 - Progress: Met  Visit Information  Last OT Received On: 01/04/12    Subjective Data      Prior Functioning       Cognition  Overall Cognitive Status: Appears within functional limits for tasks assessed/performed Arousal/Alertness: Awake/alert Orientation Level: Appears intact for tasks assessed Behavior During Session: [facility] for tasks performed    Mobility  Shoulder  Instructions Bed Mobility Bed Mobility: Rolling Left;Left Sidelying to Sit;Sitting - Scoot to Edge of Bed;Sit to Sidelying Left Rolling Left: 6: Modified independent (Device/Increase time) Left Sidelying to Sit: 6: Modified independent (Device/Increase time);HOB flat Sitting - Scoot to Edge of Bed: 6: Modified independent (Device/Increase time) Sit to Sidelying Left: 6: Modified independent (Device/Increase time);HOB flat       Exercises      Balance     End of Session OT - End of Session Activity Tolerance: Patient tolerated treatment well Patient left: in bed;with call bell/phone within reach;with family/visitor present  GO Functional Limitation: Self care Self Care Current Status (H0865): At least 20 percent but less than 40 percent impaired, limited or restricted Self Care Goal Status (H8469): At least 20 percent but less than 40 percent impaired, limited or restricted Self Care Discharge Status [phone]): At least 20 percent but less than 40 percent impaired, limited or restricted   Amri Lien M 01/04/2012, 2:29 PM

## 2012-01-04 NOTE — Progress Notes (Signed)
PATIENT ID: Derrick Kelley   2 Days Post-Op Procedure(s) (LRB): ANTERIOR LUMBAR FUSION 1 LEVEL (Bilateral) ABDOMINAL EXPOSURE (Bilateral)  Subjective: The patient is doing very well today. He worked with physical therapy twice yesterday and felt very good about it.  He tells me he feels even better today and really wants to go home. He has been eating well, voiding well, passing gas.  Pain is controlled with oral medicines..  Objective:  Filed Vitals:   01/04/12 0520  BP: 130/76  Pulse: 88  Temp: 98.5 F (36.9 C)  Resp: 16     He is seated in a chair in no distress wearing his brace. Both lower extremities are grossly neurovascularly intact.  Labs:  No results found for this basename: HGB:5 in the last 72 hoursNo results found for this basename: WBC:2,RBC:2,HCT:2,PLT:2 in the last 72 hoursNo results found for this basename: NA:2,K:2,CL:2,CO2:2,BUN:2,CREATININE:2,GLUCOSE:2,CALCIUM:2 in the last 72 hours  Assessment and Plan: Doing well He has done well with PT, pain is well-controlled with oral medicines and he is eager to go home. We will set him up for discharge today. He will followup with Dr. Yevette Edwards as scheduled.

## 2012-01-04 NOTE — Progress Notes (Signed)
D/C instructions reviewed with patient and wife. RX x 2 given. No hh services or equipment recommended. All questions answered. Pt d/c'ed via wheelchair in stable condition

## 2012-01-06 ENCOUNTER — Encounter (HOSPITAL_COMMUNITY): Payer: Self-pay | Admitting: Orthopedic Surgery

## 2012-01-15 ENCOUNTER — Institutional Professional Consult (permissible substitution): Payer: 59 | Admitting: Pulmonary Disease

## 2012-01-16 NOTE — Discharge Summary (Signed)
Patient ID: Derrick Kelley MRN: [id number] DOB/AGE: 1961/01/02 51 y.o.  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 BACK PAIN, CHRONIC 05/11/2009    bone on bone;hx of herniated disc  . Flushing 05/11/2009  . HYPERLIPIDEMIA     takes Lipitor daily  . HYPOGONADISM 05/11/2009    takes Testosterone every 14days  . INSOMNIA-SLEEP DISORDER-UNSPEC 10/10/2009    takes Ambien nightly  . POLYCYTHEMIA 05/11/2009  . SCIATICA, RIGHT 09/20/2009  . SINUSITIS- ACUTE-NOS 10/10/2009  . URI 05/11/2009  . Arthritis   . Chronic back pain     hx buldging disc;  . Hemorrhoids     hx of  . HYPERTENSION     takes Avapro,Amlodipine,and Lisinopril daily  . Headache 10/10/2009    cluster;last one about a yr ago  . Joint pain   . Joint pain   . GERD 05/11/2009    but doesn't require meds  . DEPRESSION     takes Zoloft daily    Surgeries: Procedure(s): ANTERIOR LUMBAR FUSION 1 LEVEL ABDOMINAL EXPOSURE on 01/02/2012    Hospital Course: Derrick Kelley is an 51 y.o. male who was admitted 01/02/2012 for operative treatment of low back pain. Patient has severe unremitting pain that affects sleep, daily activities, and work/hobbies. After pre-op clearance the patient was taken to the operating room on 01/02/2012 and underwent  Procedure(s): ANTERIOR LUMBAR FUSION 1 LEVEL  Patient was given perioperative antibiotics:  Anti-infectives     Start     Dose/Rate Route Frequency Ordered Stop   01/02/12 2200   ceFAZolin (ANCEF) IVPB 1 g/50 mL premix        1 g 100 mL/hr over 30 Minutes Intravenous Every 8 hours 01/02/12 2038 01/03/12 0641   01/01/12 1437   ceFAZolin (ANCEF) IVPB 2 g/50 mL premix        2 g 100 mL/hr over 30 Minutes Intravenous 60 min pre-op 01/01/12 1437 01/02/12 1353           Patient was given sequential compression devices, early ambulation, and chemoprophylaxis to  prevent DVT.  Patient benefited maximally from hospital stay and there were no complications.     Recent laboratory studies: No results found for this basename: WBC:2,HGB:2,HCT:2,PLT:2,NA:2,K:2,CL:2,CO2:2,BUN:2,CREATININE:2,GLUCOSE:2,PT:2,INR:2,CALCIUM,2: in the last 72 hours   Discharge Medications:     Medication List     As of 01/16/2012  8:47 PM    STOP taking these medications         gabapentin 300 MG capsule   Commonly known as: NEURONTIN      HYDROcodone-acetaminophen 10-325 MG per tablet   Commonly known as: NORCO      TAKE these medications         amLODipine 10 MG tablet   Commonly known as: NORVASC   Take 10 mg by mouth daily. Take 1/2 tablet per day      atorvastatin 10 MG tablet   Commonly known as: LIPITOR   Take 5 mg by mouth daily.      cyclobenzaprine 10 MG tablet   Commonly known as: FLEXERIL   Take 1 tablet (10 mg total) by mouth 3 (three) times daily as needed for muscle spasms.      HYDROmorphone 2 MG tablet   Commonly known as: DILAUDID   Take 1 tablet (2 mg total) by mouth every 4 (four) hours as needed for pain.      irbesartan  150 MG tablet   Commonly known as: AVAPRO   Take 1 tablet (150 mg total) by mouth daily.      sertraline 100 MG tablet   Commonly known as: ZOLOFT   TAKE 1 & 1/2 TABLETS BY MOUTH DAILY      testosterone cypionate 200 MG/ML injection   Commonly known as: DEPOTESTOTERONE CYPIONATE   Inject 1 mL (200 mg total) into the muscle every 14 (fourteen) days. 1 cc biweekly IM (self Injected)   Last injection was on 04/12/11      zolpidem 10 MG tablet   Commonly known as: AMBIEN   Take 1 tablet (10 mg total) by mouth at bedtime as needed. For sleep        Diagnostic Studies: Dg Chest 2 View  12/25/2011  *RADIOLOGY REPORT*  Clinical Data: Preop for lumbar spine fusion  CHEST - 2 VIEW  Comparison: Chest x-ray of 09/14/2010  Findings: No active infiltrate or effusion is seen.  Mediastinal contours are stable.  The heart is  within normal limits in size. Mild degenerative spurring is noted in the mid and lower thoracic spine.  IMPRESSION: Stable chest x-ray.  No active lung disease   Original Report Authenticated By: Dwyane Dee, M.D.    Dg Lumbar Spine 2-3 Views  01/02/2012  *RADIOLOGY REPORT*  Clinical Data: ALIF L4 - L5  LUMBAR SPINE - 2-3 VIEW,DG C-ARM GT 120 MIN  Comparison: Lumbar spine CT - 12/04/2011  Findings:  Two spot fluoroscopic intraoperative images of the lower lumbar spine provided for review.  Lumbar spine labeling is based on preprocedural lumbar spine CT.  The patient has undergone interval ALIF of L4 - L5 with associated intervertebral disc space replacement at this level. No radiopaque foreign body.  Grossly unchanged appearance of preexisting paraspinal fusion and laminectomy at this level.  IMPRESSION: Post ALIF of L4 - L5.   Original Report Authenticated By: Tacey Ruiz, MD    Dg Abd 1 View  01/02/2012  *RADIOLOGY REPORT*  Clinical Data: Anterior laminectomy and internal fusion. Assessment for any unexpected retained foreign bodies.  ABDOMEN - 1 VIEW  Comparison: None.  Findings: Posterolateral rod pedicle screw fixation noted at L4-5 along with a new anterior plate screw fixator at L4-5, and evidence of lower lumbar posterior decompression.  Vascular clips are noted projecting over the left lower lumbar spine and pelvis.  Radiodense marker dots noted at the L5-S1 level.  Facet fusion at L5-S1 noted.  I do not observe a sponge, instrument, no definite needle, or other unexpected foreign body.  IMPRESSION:  1.  Spine fixation hardware and vascular clips observed.  No unexpected foreign body is identified.   Original Report Authenticated By: Gaylyn Rong, M.D.    Dg C-arm Gt 120 Min  01/02/2012  *RADIOLOGY REPORT*  Clinical Data: ALIF L4 - L5  LUMBAR SPINE - 2-3 VIEW,DG C-ARM GT 120 MIN  Comparison: Lumbar spine CT - 12/04/2011  Findings:  Two spot fluoroscopic intraoperative images of the lower  lumbar spine provided for review.  Lumbar spine labeling is based on preprocedural lumbar spine CT.  The patient has undergone interval ALIF of L4 - L5 with associated intervertebral disc space replacement at this level. No radiopaque foreign body.  Grossly unchanged appearance of preexisting paraspinal fusion and laminectomy at this level.  IMPRESSION: Post ALIF of L4 - L5.   Original Report Authenticated By: Tacey Ruiz, MD     Disposition: 01-Home or Self Care  SignedEmilee Hero 01/16/2012, 8:47 PM

## 2012-02-07 ENCOUNTER — Encounter: Payer: 59 | Admitting: Internal Medicine

## 2012-02-14 ENCOUNTER — Other Ambulatory Visit: Payer: Self-pay | Admitting: Internal Medicine

## 2012-02-14 NOTE — Telephone Encounter (Signed)
Faxed hardcopy to pharmacy. 

## 2012-02-14 NOTE — Telephone Encounter (Signed)
Done hardcopy to robin  

## 2012-04-22 ENCOUNTER — Other Ambulatory Visit: Payer: Self-pay

## 2012-04-22 MED ORDER — TESTOSTERONE CYPIONATE 200 MG/ML IM SOLN: 200.0000 mg | INTRAMUSCULAR | Status: DC

## 2012-04-22 NOTE — Telephone Encounter (Signed)
Faxed hardcopy to pharmacy. 

## 2012-04-22 NOTE — Telephone Encounter (Signed)
Done hardcopy to robin  

## 2012-05-07 ENCOUNTER — Encounter: Payer: Self-pay | Admitting: Internal Medicine

## 2012-05-07 ENCOUNTER — Ambulatory Visit (INDEPENDENT_AMBULATORY_CARE_PROVIDER_SITE_OTHER): Payer: PRIVATE HEALTH INSURANCE | Admitting: Internal Medicine

## 2012-05-07 VITALS — BP 142/90 | HR 85 | Temp 97.1°F | Ht 68.0 in | Wt 198.0 lb

## 2012-05-07 DIAGNOSIS — F329 Major depressive disorder, single episode, unspecified: Secondary | ICD-10-CM

## 2012-05-07 DIAGNOSIS — I10 Essential (primary) hypertension: Secondary | ICD-10-CM

## 2012-05-07 DIAGNOSIS — G4700 Insomnia, unspecified: Secondary | ICD-10-CM

## 2012-05-07 MED ORDER — ACETAMINOPHEN-CODEINE 300-30 MG PO TABS: 1.0000 | ORAL_TABLET | Freq: Four times a day (QID) | ORAL | Status: DC | PRN

## 2012-05-07 MED ORDER — CLONAZEPAM 1 MG PO TABS: 1.0000 mg | ORAL_TABLET | Freq: Every evening | ORAL | Status: DC | PRN

## 2012-05-07 MED ORDER — GABAPENTIN 300 MG PO CAPS: 300.0000 mg | ORAL_CAPSULE | Freq: Three times a day (TID) | ORAL | Status: DC | PRN

## 2012-05-07 NOTE — Assessment & Plan Note (Signed)
stable overall by history and exam, recent data reviewed with pt, and pt to continue medical treatment as before,  to f/u any worsening symptoms or concerns Lab Results  Component Value Date   WBC 6.5 12/25/2011   HGB 17.3* 12/25/2011   HCT 48.8 12/25/2011   PLT 190 12/25/2011   GLUCOSE 89 12/25/2011   CHOL 147 11/06/2011   TRIG 154.0* 11/06/2011   HDL 34.30* 11/06/2011   LDLDIRECT 124.8 10/21/2011   LDLCALC 82 11/06/2011   ALT 14 12/25/2011   AST 18 12/25/2011   NA 135 12/25/2011   K 4.4 12/25/2011   CL 98 12/25/2011   CREATININE 1.43* 12/25/2011   BUN 26* 12/25/2011   CO2 30 12/25/2011   TSH 2.35 10/21/2011   PSA 0.47 10/21/2011   INR 1.07 12/25/2011

## 2012-05-07 NOTE — Patient Instructions (Addendum)
Please take all new medication as prescribed - the klonopin OK to stop the Juliette Please continue all other medications as before, and refills have been done if requested. Thank you for enrolling in MyChart. Please follow the instructions below to securely access your online medical record. MyChart allows you to send messages to your doctor, view your test results, renew your prescriptions, schedule appointments, and more. To Log into My Chart online, please go by [employer] or [employer] to [email or web address], or download the MyChart App from the [employer] of [employer] .  Your Username is: [email or web address] (pass [email or web address]) Please send a practice Message on Mychart later today. Please return in Sept 2014, or sooner if needed, with Lab testing done 3-5 days before

## 2012-05-07 NOTE — Assessment & Plan Note (Signed)
stable overall by history and exam, recent data reviewed with pt, and pt to continue medical treatment as before,  to f/u any worsening symptoms or concerns BP Readings from Last 3 Encounters:  05/07/12 142/90  01/04/12 130/76  01/04/12 130/76

## 2012-05-07 NOTE — Assessment & Plan Note (Signed)
With ONE[employer] side effect, will hold on trazodone as already on zoloft, for klonopin qhs prn, has worked well in past

## 2012-05-07 NOTE — Progress Notes (Signed)
Subjective:    Patient ID: Derrick Kelley, male    DOB: [birth year], 52 y.o.   MRN: [id number]  HPI  Here to f/u, Here to f/u; overall doing ok,  Pt denies chest pain, increased sob or doe, wheezing, orthopnea, PND, increased LE swelling, palpitations, dizziness or syncope.  Pt denies polydipsia, polyuria, or low sugar symptoms such as weakness or confusion improved with po intake.  Pt denies new neurological symptoms such as new headache, or facial or extremity weakness or numbness.   Pt states overall good compliance with meds, has been trying to follow lower cholesterol diet, with wt overall stable. Not sleeping well lately as Remus Loffler has been causing memory disturbance during the night and does not want to take further.  Denies worsening depressive symptoms, suicidal ideation, or panic Past Medical History  Diagnosis Date  . BACK PAIN, CHRONIC 05/11/2009    bone on bone;hx of herniated disc  . Flushing 05/11/2009  . HYPERLIPIDEMIA     takes Lipitor daily  . HYPOGONADISM 05/11/2009    takes Testosterone every 14days  . INSOMNIA-SLEEP DISORDER-UNSPEC 10/10/2009    takes Ambien nightly  . POLYCYTHEMIA 05/11/2009  . SCIATICA, RIGHT 09/20/2009  . SINUSITIS- ACUTE-NOS 10/10/2009  . URI 05/11/2009  . Arthritis   . Chronic back pain     hx buldging disc;  . Hemorrhoids     hx of  . HYPERTENSION     takes Avapro,Amlodipine,and Lisinopril daily  . Headache 10/10/2009    cluster;last one about a yr ago  . Joint pain   . Joint pain   . GERD 05/11/2009    but doesn't require meds  . DEPRESSION     takes Zoloft daily   Past Surgical History  Procedure Laterality Date  . Left clavicle fracture    . Hx eye surgury 2001  many yrs ago    pterygium-both eyes  . Hx back surgury lumbar fusion  2007  . Hx left shoulder surgury  2002 and August 10, 2009    Dr. Ave Filter, ortho  . Mouth surgery      wisdom teeth extracted  . Back surgery  2013  . Left shoulder surgery    . Anterior lumbar fusion   01/02/2012    Procedure: ANTERIOR LUMBAR FUSION 1 LEVEL;  Surgeon: Emilee Hero, MD;  [facility];  Service: Orthopedics;  Laterality: Bilateral;  Anterior lumbar interbody fusion, lumbar 4-5 with OP-1 and instrumentation.  . Abdominal exposure  01/02/2012    Procedure: ABDOMINAL EXPOSURE;  Surgeon: Chuck Hint, MD;  Location: [facility] OR;  Service: Vascular;  Laterality: Bilateral;    reports that he has never smoked. He has never used smokeless tobacco. He reports that he does not drink alcohol or use illicit drugs. family history includes Alcohol abuse in his father, maternal aunt, maternal uncle, and other and Cancer in his father, maternal grandmother, and paternal uncle.  There is no history of Anesthesia problems, and Hypotension, and Malignant hyperthermia, and Pseudochol deficiency, . Allergies  Allergen Reactions  . Ambien (Zolpidem Tartrate) Other (See Comments)    Memory dysfunction  . Bupropion Hcl     REACTION: nightmares   Current Outpatient Prescriptions on File Prior to Visit  Medication Sig Dispense Refill  . atorvastatin (LIPITOR) 10 MG tablet Take 5 mg by mouth daily.      . cyclobenzaprine (FLEXERIL) 10 MG tablet Take 1 tablet (10 mg total) by mouth 3 (three) times daily as needed for muscle spasms.  60 tablet  0  . irbesartan (AVAPRO) 150 MG tablet Take 1 tablet (150 mg total) by mouth daily.  90 tablet  3  . sertraline (ZOLOFT) 100 MG tablet TAKE 1 & 1/2 TABLETS BY MOUTH DAILY  135 tablet  3  . testosterone cypionate (DEPOTESTOTERONE CYPIONATE) 200 MG/ML injection Inject 1 mL (200 mg total) into the muscle every 14 (fourteen) days. 1 cc biweekly IM (self Injected)  Last injection was on 04/12/11  10 mL  5   No current facility-administered medications on file prior to visit.     Review of Systems  Constitutional: Negative for unexpected weight change, or unusual diaphoresis  HENT: Negative for tinnitus.   Eyes: Negative for photophobia and visual  disturbance.  Respiratory: Negative for choking and stridor.   Gastrointestinal: Negative for vomiting and blood in stool.  Genitourinary: Negative for hematuria and decreased urine volume.  Musculoskeletal: Negative for acute joint swelling Skin: Negative for color change and wound.  Neurological: Negative for tremors and numbness other than noted  Psychiatric/Behavioral: Negative for decreased concentration or  hyperactivity.       Objective:   Physical Exam BP 142/90  Pulse 85  Temp(Src) 97.1 F (36.2 C) (Oral)  Ht 5\' 8"  (1.727 m)  Wt 198 lb (89.812 kg)  BMI 30.11 kg/m2  SpO2 96% VS noted, not ill appearing Constitutional: Pt appears well-developed and well-nourished.  HENT: Head: NCAT.  Right Ear: External ear normal.  Left Ear: External ear normal.  Eyes: Conjunctivae and EOM are normal. Pupils are equal, round, and reactive to light.  Neck: Normal range of motion. Neck supple.  Cardiovascular: Normal rate and regular rhythm.   Pulmonary/Chest: Effort normal and breath sounds normal.  Neurological: Pt is alert. Not confused  Skin: Skin is warm. No erythema.  Psychiatric: Pt behavior is normal. Thought content normal. 1+ nervous    Assessment & Plan:

## 2012-05-14 ENCOUNTER — Telehealth: Payer: Self-pay | Admitting: Internal Medicine

## 2012-05-14 MED ORDER — GABAPENTIN 300 MG PO CAPS: 300.0000 mg | ORAL_CAPSULE | Freq: Three times a day (TID) | ORAL | Status: DC | PRN

## 2012-05-14 NOTE — Telephone Encounter (Signed)
Done erx 

## 2012-05-14 NOTE — Telephone Encounter (Signed)
Patient states pharmacy requested refill for Neurontin but have not heard back , he needs this refill sent to [facility] in [place]

## 2012-06-23 ENCOUNTER — Telehealth: Payer: Self-pay | Admitting: Internal Medicine

## 2012-06-23 MED ORDER — CLONAZEPAM 1 MG PO TABS: 1.0000 mg | ORAL_TABLET | Freq: Every evening | ORAL | Status: DC | PRN

## 2012-06-23 MED ORDER — CLONAZEPAM 1 MG PO TABS: 2.0000 mg | ORAL_TABLET | Freq: Every evening | ORAL | Status: DC | PRN

## 2012-06-23 NOTE — Telephone Encounter (Signed)
Done hardcopy to robin  

## 2012-06-23 NOTE — Telephone Encounter (Signed)
Pt needs refill of clonazePAM (KLONOPIN), pt has increased dosage to 2tabs at night to get some sleep, [facility]

## 2012-06-24 NOTE — Telephone Encounter (Signed)
Sorry the 1 mg at 2 tabs by mouth qhs prn, #60

## 2012-06-24 NOTE — Telephone Encounter (Signed)
Faxed hardcopy to pharmacy. 

## 2012-06-24 NOTE — Telephone Encounter (Signed)
Advise please on which rx to send as you printed both

## 2012-07-31 ENCOUNTER — Telehealth: Payer: Self-pay | Admitting: Internal Medicine

## 2012-07-31 NOTE — Telephone Encounter (Signed)
Rec'd from [facility] forward 2 pages to Dr. John °

## 2012-08-06 ENCOUNTER — Telehealth: Payer: Self-pay | Admitting: Internal Medicine

## 2012-08-06 NOTE — Telephone Encounter (Signed)
Rec'd from [facility] forward 2 pages to Dr. John °

## 2012-09-07 ENCOUNTER — Telehealth: Payer: Self-pay

## 2012-09-07 MED ORDER — ZOLPIDEM TARTRATE 10 MG PO TABS: 10.0000 mg | ORAL_TABLET | Freq: Every evening | ORAL | Status: DC | PRN

## 2012-09-07 NOTE — Telephone Encounter (Signed)
Refill request for Zolpidem 10 mg please advise

## 2012-09-07 NOTE — Telephone Encounter (Signed)
Done hardcopy to robin  

## 2012-09-08 NOTE — Telephone Encounter (Signed)
Faxed hardcopy to [facility]. 

## 2012-09-15 ENCOUNTER — Other Ambulatory Visit: Payer: Self-pay

## 2012-09-15 MED ORDER — CLONAZEPAM 1 MG PO TABS: 2.0000 mg | ORAL_TABLET | Freq: Every evening | ORAL | Status: DC | PRN

## 2012-09-15 NOTE — Telephone Encounter (Signed)
Faxed hardcopy to [facility] 

## 2012-09-15 NOTE — Telephone Encounter (Signed)
Done hardcopy to robin  

## 2012-10-07 ENCOUNTER — Ambulatory Visit
Admission: RE | Admit: 2012-10-07 | Discharge: 2012-10-07 | Disposition: A | Discharge status: Home / Self Care | Payer: 59 | Source: Ambulatory Visit | Attending: Pain Medicine | Admitting: Pain Medicine

## 2012-10-07 ENCOUNTER — Other Ambulatory Visit: Payer: Self-pay | Admitting: Pain Medicine

## 2012-10-07 DIAGNOSIS — M25551 Pain in right hip: Secondary | ICD-10-CM

## 2012-10-27 ENCOUNTER — Other Ambulatory Visit: Payer: Self-pay

## 2012-10-27 MED ORDER — GABAPENTIN 300 MG PO CAPS: 300.0000 mg | ORAL_CAPSULE | Freq: Three times a day (TID) | ORAL | Status: DC | PRN

## 2012-10-27 NOTE — Telephone Encounter (Signed)
Done erx 

## 2012-11-03 ENCOUNTER — Other Ambulatory Visit: Payer: Self-pay

## 2012-11-03 MED ORDER — SERTRALINE HCL 100 MG PO TABS: ORAL_TABLET | ORAL | Status: DC

## 2012-11-23 ENCOUNTER — Other Ambulatory Visit: Payer: Self-pay | Admitting: *Deleted

## 2012-11-23 MED ORDER — IRBESARTAN 150 MG PO TABS: 150.0000 mg | ORAL_TABLET | Freq: Every day | ORAL | Status: DC

## 2012-12-17 ENCOUNTER — Other Ambulatory Visit: Payer: Self-pay

## 2013-01-27 ENCOUNTER — Other Ambulatory Visit: Payer: Self-pay

## 2013-01-27 MED ORDER — TESTOSTERONE CYPIONATE 200 MG/ML IM SOLN: 200.0000 mg | INTRAMUSCULAR | Status: AC

## 2013-01-27 NOTE — Telephone Encounter (Signed)
Done hardcopy to robin  

## 2013-01-27 NOTE — Telephone Encounter (Signed)
Faxed hardcopy to [facility]. 

## 2013-03-08 ENCOUNTER — Ambulatory Visit: Payer: 59 | Admitting: Internal Medicine

## 2013-03-08 DIAGNOSIS — Z0289 Encounter for other administrative examinations: Secondary | ICD-10-CM

## 2013-03-11 ENCOUNTER — Encounter: Payer: 59 | Admitting: Internal Medicine

## 2013-03-16 ENCOUNTER — Other Ambulatory Visit: Payer: Self-pay

## 2013-03-16 MED ORDER — ZOLPIDEM TARTRATE 10 MG PO TABS: 10.0000 mg | ORAL_TABLET | Freq: Every evening | ORAL | Status: DC | PRN

## 2013-03-16 MED ORDER — CLONAZEPAM 1 MG PO TABS: 2.0000 mg | ORAL_TABLET | Freq: Every evening | ORAL | Status: DC | PRN

## 2013-03-16 NOTE — Telephone Encounter (Signed)
Faxed hardcopy to [place]

## 2013-03-16 NOTE — Telephone Encounter (Signed)
Pharmacy requesting refill on zolpidem 10 mg

## 2013-03-16 NOTE — Telephone Encounter (Signed)
Klonopin and ambien . = Done hardcopy to robin

## 2013-03-17 ENCOUNTER — Encounter: Payer: Self-pay | Admitting: Internal Medicine

## 2013-03-17 ENCOUNTER — Ambulatory Visit (INDEPENDENT_AMBULATORY_CARE_PROVIDER_SITE_OTHER): Payer: 59 | Admitting: Internal Medicine

## 2013-03-17 VITALS — BP 166/108 | HR 88 | Temp 98.2°F | Ht 68.0 in | Wt 192.0 lb

## 2013-03-17 DIAGNOSIS — E291 Testicular hypofunction: Secondary | ICD-10-CM

## 2013-03-17 DIAGNOSIS — M549 Dorsalgia, unspecified: Secondary | ICD-10-CM

## 2013-03-17 DIAGNOSIS — K219 Gastro-esophageal reflux disease without esophagitis: Secondary | ICD-10-CM

## 2013-03-17 DIAGNOSIS — F3289 Other specified depressive episodes: Secondary | ICD-10-CM

## 2013-03-17 DIAGNOSIS — I10 Essential (primary) hypertension: Secondary | ICD-10-CM

## 2013-03-17 DIAGNOSIS — G4700 Insomnia, unspecified: Secondary | ICD-10-CM

## 2013-03-17 DIAGNOSIS — E785 Hyperlipidemia, unspecified: Secondary | ICD-10-CM

## 2013-03-17 DIAGNOSIS — Z125 Encounter for screening for malignant neoplasm of prostate: Secondary | ICD-10-CM

## 2013-03-17 DIAGNOSIS — F329 Major depressive disorder, single episode, unspecified: Secondary | ICD-10-CM

## 2013-03-17 LAB — CBC
HCT: 51.8 % (ref 39.0–52.0)
Hemoglobin: 17.5 g/dL — ABNORMAL HIGH (ref 13.0–17.0)
MCHC: 33.9 g/dL (ref 30.0–36.0)
MCV: 96.4 fl (ref 78.0–100.0)
Platelets: 227 10*3/uL (ref 150.0–400.0)
RBC: 5.37 Mil/uL (ref 4.22–5.81)
RDW: 13.9 % (ref 11.5–14.6)
WBC: 9.4 10*3/uL (ref 4.5–10.5)

## 2013-03-17 MED ORDER — HYDROCHLOROTHIAZIDE 12.5 MG PO CAPS: 12.5000 mg | ORAL_CAPSULE | Freq: Every day | ORAL | Status: DC

## 2013-03-17 NOTE — Progress Notes (Signed)
HPI: Pt presents today to establish care. He is transferring care from Dr. Jenny Reichmann. No concerns at this time. He states he is overall healthy, eats well and exercises some. Denies any chest pain, headaches, dizziness, or shortness of breath  Chronic Back Pain: doing better; going to a pain clinic. Started suboxone via pain clinic  HLD: Stopped taking Lipitor due to insurance issues.   Hypogonadism: He is on biweekly testerone injections. He does request we check his testerone levels  Insomnia: Doing okay with ambien 10mg  qhs  HTN: Elevated; taking avapro daily  Depression: well controlled on Zoloft  Flu shot: February Tetanus: 2010 Colonoscopy: GI consulted; within next few month Prostate exam: never; will do at physical Eye Exam: 2014 Dentist: 2014   Past Medical History  Diagnosis Date  . BACK PAIN, CHRONIC 05/11/2009    bone on bone;hx of herniated disc  . Flushing 05/11/2009  . HYPERLIPIDEMIA     takes Lipitor daily  . HYPOGONADISM 05/11/2009    takes Testosterone every 14days  . INSOMNIA-SLEEP DISORDER-UNSPEC 10/10/2009    takes Ambien nightly  . POLYCYTHEMIA 05/11/2009  . SCIATICA, RIGHT 09/20/2009  . SINUSITIS- ACUTE-NOS 10/10/2009  . URI 05/11/2009  . Arthritis   . Chronic back pain     hx buldging disc;  . Hemorrhoids     hx of  . HYPERTENSION     takes Avapro,Amlodipine,and Lisinopril daily  . Headache(784.0) 10/10/2009    cluster;last one about a yr ago  . Joint pain   . Joint pain   . GERD 05/11/2009    but doesn't require meds  . DEPRESSION     takes Zoloft daily    Current Outpatient Prescriptions  Medication Sig Dispense Refill  . buprenorphine-naloxone (SUBOXONE) 8-2 MG SUBL SL tablet Place 1 tablet under the tongue 3 (three) times daily.      Marland Kitchen gabapentin (NEURONTIN) 300 MG capsule Take 1 capsule (300 mg total) by mouth 3 (three) times daily as needed.  90 capsule  5  . irbesartan (AVAPRO) 150 MG tablet Take 1 tablet (150 mg total) by mouth daily.  90  tablet  3  . meloxicam (MOBIC) 15 MG tablet Take 15 mg by mouth daily.      . sertraline (ZOLOFT) 100 MG tablet TAKE 1 & 1/2 TABLETS BY MOUTH DAILY  135 tablet  3  . testosterone cypionate (DEPOTESTOTERONE CYPIONATE) 200 MG/ML injection Inject 1 mL (200 mg total) into the muscle every 14 (fourteen) days. 1 cc biweekly IM (self Injected)  Last injection was on 04/12/11  10 mL  5  . tiZANidine (ZANAFLEX) 4 MG tablet Take 4 mg by mouth 2 (two) times daily as needed for muscle spasms.      Marland Kitchen zolpidem (AMBIEN) 10 MG tablet Take 1 tablet (10 mg total) by mouth at bedtime as needed for sleep.  30 tablet  0  . atorvastatin (LIPITOR) 10 MG tablet Take 5 mg by mouth daily.       No current facility-administered medications for this visit.    Allergies  Allergen Reactions  . Ambien [Zolpidem Tartrate] Other (See Comments)    Memory dysfunction  . Bupropion Hcl     REACTION: nightmares    Family History  Problem Relation Age of Onset  . Alcohol abuse Father   . Cancer Father     prostate  . Alcohol abuse Maternal Aunt   . Alcohol abuse Maternal Uncle   . Cancer Paternal Uncle     prostate  .  Cancer Maternal Grandmother     ovarian cancer  . Alcohol abuse Other   . Anesthesia problems Neg Hx   . Hypotension Neg Hx   . Malignant hyperthermia Neg Hx   . Pseudochol deficiency Neg Hx     History   Social History  . Marital Status: Married    Spouse Name: N/A    Number of Children: 2  . Years of Education: N/A   Occupational History  . CONSTRUCTION WORKER    Social History Main Topics  . Smoking status: Never Smoker   . Smokeless tobacco: Never Used  . Alcohol Use: No  . Drug Use: No     Comment: former cocaine, none for years  . Sexual Activity: Yes   Other Topics Concern  . Not on file   Social History Narrative  . No narrative on file    ROS:  Constitutional: Denies fever, malaise, fatigue, headache or abrupt weight changes.  Respiratory: Denies difficulty breathing,  shortness of breath, cough or sputum production.   Cardiovascular: Denies chest pain, chest tightness, palpitations or swelling in the hands or feet.  Gastrointestinal: Denies abdominal pain, bloating, constipation, diarrhea or blood in the stool.  GU: Denies frequency, urgency, pain with urination, blood in urine, odor or discharge. Musculoskeletal: Denies decrease in range of motion, difficulty with gait, muscle pain or joint pain and swelling.  Skin: Denies redness, rashes, lesions or ulcercations.  Neurological: Denies dizziness, difficulty with memory, difficulty with speech or problems with balance and coordination.   No other specific complaints in a complete review of systems (except as listed in HPI above).  PE:  Wt 192 lb (87.091 kg) Wt Readings from Last 3 Encounters:  03/17/13 192 lb (87.091 kg)  05/07/12 198 lb (89.812 kg)  12/25/11 195 lb 6.4 oz (88.633 kg)    General: Appears their stated age, well developed, well nourished in NAD.Marland Kitchen  Neck: Normal range of motion. Neck supple, trachea midline. No massses, lumps or thyromegaly present.  Cardiovascular: Normal rate and rhythm. S1,S2 noted.  No murmur, rubs or gallops noted. No JVD or BLE edema. No carotid bruits noted. Pulmonary/Chest: Normal effort and positive vesicular breath sounds. No respiratory distress. No wheezes, rales or ronchi noted.  Psychiatric: Mood and affect normal. Behavior is normal. Judgment and thought content normal.     Assessment and Plan: Health Maintenance: Encouraged low salt diet Will check PSA, testosterone,  Lipid panel, CBC, and CMET Pt has appt with GI for colonoscopy this week  HTN: Elevated today; will add HCTZ 12.5mg  PO daily  HLD: Will check lipid panel today  Hypogonadism: Will check testesterone level today per patient request Does see specialist for this   Depression: Continue Zoloft; well controlled  Follow up in 2 weeks for physical exam and to review lab  results  Westley Foots, Student-NP

## 2013-03-17 NOTE — Patient Instructions (Signed)

## 2013-03-17 NOTE — Assessment & Plan Note (Signed)
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

## 2013-03-17 NOTE — Progress Notes (Signed)
Pre-visit discussion using our clinic review tool. No additional management support is needed unless otherwise documented below in the visit note.  

## 2013-03-17 NOTE — Assessment & Plan Note (Signed)
On mobic, zanaflex and neurontin Continues suboxone for opiod dependance

## 2013-03-17 NOTE — Assessment & Plan Note (Signed)
Well controlled at this time Not medicated

## 2013-03-17 NOTE — Progress Notes (Signed)
HPI  Pt presents to the clinic today to transfer care from Dr. Jenny Reichmann. He does have a very complicated PMH. He is currently on suboxone to try to get off opoid addiction.  Flu: 2013 Tetanus: 2010 PSA: 2013 Colonoscopy: has one scheduled in the next few months   Chronic Back Pain: On zanaflex, mobic and neurontin. He is going to a pain clinic at this time where they have him on suboxone.  Hyperlipidemia: He has stopped his Lipitor. 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  Hypogonadism: He is on testosterone injections biweekly. He would like his testosterone checked today.  Insomnia: On ambien 10 mg QHS.  HTN: Elevated on Avapro. He is on suboxone for opiod dependance.  GERD: Not medicated at this time.  Depression: On zoloft. Well controlled at this time.  Past Medical History  Diagnosis Date  . BACK PAIN, CHRONIC 05/11/2009    bone on bone;hx of herniated disc  . Flushing 05/11/2009  . HYPERLIPIDEMIA     takes Lipitor daily  . HYPOGONADISM 05/11/2009    takes Testosterone every 14days  . INSOMNIA-SLEEP DISORDER-UNSPEC 10/10/2009    takes Ambien nightly  . POLYCYTHEMIA 05/11/2009  . SCIATICA, RIGHT 09/20/2009  . SINUSITIS- ACUTE-NOS 10/10/2009  . URI 05/11/2009  . Arthritis   . Chronic back pain     hx buldging disc;  . Hemorrhoids     hx of  . HYPERTENSION     takes Avapro,Amlodipine,and Lisinopril daily  . Headache(784.0) 10/10/2009    cluster;last one about a yr ago  . Joint pain   . Joint pain   . GERD 05/11/2009    but doesn't require meds  . DEPRESSION     takes Zoloft daily    Current Outpatient Prescriptions  Medication Sig Dispense Refill  . buprenorphine-naloxone (SUBOXONE) 8-2 MG SUBL SL tablet Place 1 tablet under the tongue 3 (three) times daily.      Marland Kitchen gabapentin (NEURONTIN) 300 MG capsule Take 1 capsule (300 mg total) by mouth 3 (three) times daily as needed.  90 capsule  5  . irbesartan (AVAPRO) 150 MG tablet Take 1 tablet (150 mg total) by mouth daily.   90 tablet  3  . meloxicam (MOBIC) 15 MG tablet Take 15 mg by mouth daily.      . sertraline (ZOLOFT) 100 MG tablet TAKE 1 & 1/2 TABLETS BY MOUTH DAILY  135 tablet  3  . testosterone cypionate (DEPOTESTOTERONE CYPIONATE) 200 MG/ML injection Inject 1 mL (200 mg total) into the muscle every 14 (fourteen) days. 1 cc biweekly IM (self Injected)  Last injection was on 04/12/11  10 mL  5  . tiZANidine (ZANAFLEX) 4 MG tablet Take 4 mg by mouth 2 (two) times daily as needed for muscle spasms.      Marland Kitchen zolpidem (AMBIEN) 10 MG tablet Take 1 tablet (10 mg total) by mouth at bedtime as needed for sleep.  30 tablet  0  . atorvastatin (LIPITOR) 10 MG tablet Take 5 mg by mouth daily.       No current facility-administered medications for this visit.    Allergies  Allergen Reactions  . Ambien [Zolpidem Tartrate] Other (See Comments)    Memory dysfunction  . Bupropion Hcl     REACTION: nightmares    Family History  Problem Relation Age of Onset  . Alcohol abuse Father   . Cancer Father     prostate  . Alcohol abuse Maternal Aunt   . Alcohol abuse Maternal Uncle   .  Cancer Paternal Uncle     prostate  . Cancer Maternal Grandmother     ovarian cancer  . Alcohol abuse Other   . Anesthesia problems Neg Hx   . Hypotension Neg Hx   . Malignant hyperthermia Neg Hx   . Pseudochol deficiency Neg Hx     History   Social History  . Marital Status: Married    Spouse Name: N/A    Number of Children: 2  . Years of Education: N/A   Occupational History  . CONSTRUCTION WORKER    Social History Main Topics  . Smoking status: Never Smoker   . Smokeless tobacco: Never Used  . Alcohol Use: Yes     Comment: occasional  . Drug Use: No     Comment: former cocaine, none for years  . Sexual Activity: Yes   Other Topics Concern  . Not on file   Social History Narrative  . No narrative on file    ROS:  Constitutional: Denies fever, malaise, fatigue, headache or abrupt weight changes.  Respiratory:  Denies difficulty breathing, shortness of breath, cough or sputum production.   Cardiovascular: Denies chest pain, chest tightness, palpitations or swelling in the hands or feet.  Gastrointestinal: Denies abdominal pain, bloating, constipation, diarrhea or blood in the stool.  GU: Denies frequency, urgency, pain with urination, blood in urine, odor or discharge. Musculoskeletal: Pt reports chronic back pain. Denies decrease in range of motion, difficulty with gait, muscle pain or joint swelling.  Neurological: Denies dizziness, difficulty with memory, difficulty with speech or problems with balance and coordination.   No other specific complaints in a complete review of systems (except as listed in HPI above).  PE:  BP 166/108  Pulse 88  Temp(Src) 98.2 F (36.8 C) (Oral)  Ht 5\' 8"  (1.727 m)  Wt 192 lb (87.091 kg)  BMI 29.20 kg/m2  SpO2 97% Wt Readings from Last 3 Encounters:  03/17/13 192 lb (87.091 kg)  05/07/12 198 lb (89.812 kg)  12/25/11 195 lb 6.4 oz (88.633 kg)    General: Appears his stated age, well developed, well nourished in NAD. Cardiovascular: Normal rate and rhythm. S1,S2 noted.  No murmur, rubs or gallops noted. No JVD or BLE edema. No carotid bruits noted. Pulmonary/Chest: Normal effort and positive vesicular breath sounds. No respiratory distress. No wheezes, rales or ronchi noted.  Psychiatric: Mood and affect normal. Behavior is normal. Judgment and thought content normal.     BMET    Component Value Date/Time   NA 135 12/25/2011 1222   K 4.4 12/25/2011 1222   CL 98 12/25/2011 1222   CO2 30 12/25/2011 1222   GLUCOSE 89 12/25/2011 1222   BUN 26* 12/25/2011 1222   CREATININE 1.43* 12/25/2011 1222   CALCIUM 9.3 12/25/2011 1222   GFRNONAA 55* 12/25/2011 1222   GFRAA 64* 12/25/2011 1222    Lipid Panel     Component Value Date/Time   CHOL 147 11/06/2011 1301   TRIG 154.0* 11/06/2011 1301   HDL 34.30* 11/06/2011 1301   CHOLHDL 4 11/06/2011 1301   VLDL 30.8  11/06/2011 1301   LDLCALC 82 11/06/2011 1301    CBC    Component Value Date/Time   WBC 6.5 12/25/2011 1222   WBC 7.4 01/02/2010 1416   RBC 5.42 12/25/2011 1222   RBC 4.86 01/02/2010 1416   HGB 17.3* 12/25/2011 1222   HGB Unable to Determine 01/02/2010 1416   HCT 48.8 12/25/2011 1222   HCT 41.2 01/02/2010 1416   PLT  190 12/25/2011 1222   PLT 208 01/02/2010 1416   MCV 90.0 12/25/2011 1222   MCV 84.8 01/02/2010 1416   MCH 31.9 12/25/2011 1222   MCH Unable to determine 01/02/2010 1416   MCHC 35.5 12/25/2011 1222   MCHC Unable to determine 01/02/2010 1416   RDW 12.7 12/25/2011 1222   RDW 12.9 01/02/2010 1416   LYMPHSABS 1.6 12/25/2011 1222   LYMPHSABS 1.6 01/02/2010 1416   MONOABS 0.5 12/25/2011 1222   MONOABS 0.4 01/02/2010 1416   EOSABS 0.0 12/25/2011 1222   EOSABS 0.1 01/02/2010 1416   BASOSABS 0.0 12/25/2011 1222   BASOSABS 0.0 01/02/2010 1416    Hgb A1C No results found for this basename: HGBA1C     Assessment and Plan:

## 2013-03-17 NOTE — Assessment & Plan Note (Addendum)
Elevated on Avapro Will add HCTZ 12.5 mg today  RTC in 1 month to recheck BP

## 2013-03-17 NOTE — Assessment & Plan Note (Signed)
On testosterone biweekly injections He would like his testosterone level checked today

## 2013-03-17 NOTE — Assessment & Plan Note (Signed)
Well controlled on Zoloft.

## 2013-03-17 NOTE — Assessment & Plan Note (Signed)
Well controlled on [place]

## 2013-03-18 LAB — PSA: PSA: 0.43 ng/mL (ref 0.10–4.00)

## 2013-03-18 LAB — COMPREHENSIVE METABOLIC PANEL
ALK PHOS: 67 U/L (ref 39–117)
ALT: 30 U/L (ref 0–53)
AST: 24 U/L (ref 0–37)
Albumin: 4.4 g/dL (ref 3.5–5.2)
BILIRUBIN TOTAL: 0.8 mg/dL (ref 0.3–1.2)
BUN: 18 mg/dL (ref 6–23)
CO2: 33 meq/L — AB (ref 19–32)
Calcium: 10.2 mg/dL (ref 8.4–10.5)
Chloride: 102 mEq/L (ref 96–112)
Creatinine, Ser: 1.5 mg/dL (ref 0.4–1.5)
GFR: 53.35 mL/min — ABNORMAL LOW (ref >60.00)
Glucose, Bld: 104 mg/dL — ABNORMAL HIGH (ref 70–99)
Potassium: 4.6 mEq/L (ref 3.5–5.1)
SODIUM: 142 meq/L (ref 135–145)
TOTAL PROTEIN: 6.9 g/dL (ref 6.0–8.3)

## 2013-03-18 LAB — LDL CHOLESTEROL, DIRECT: LDL DIRECT: 136.6 mg/dL

## 2013-03-18 LAB — LIPID PANEL
CHOL/HDL RATIO: 7
Cholesterol: 222 mg/dL — ABNORMAL HIGH (ref 0–200)
HDL: 33.3 mg/dL — AB (ref >39.00)
Triglycerides: 424 mg/dL — ABNORMAL HIGH (ref 0.0–149.0)
VLDL: 84.8 mg/dL — AB (ref 0.0–40.0)

## 2013-03-18 LAB — TESTOSTERONE: TESTOSTERONE: 692.13 ng/dL (ref 350.00–890.00)

## 2013-04-02 ENCOUNTER — Ambulatory Visit (INDEPENDENT_AMBULATORY_CARE_PROVIDER_SITE_OTHER): Payer: 59 | Admitting: Internal Medicine

## 2013-04-02 ENCOUNTER — Encounter: Payer: Self-pay | Admitting: Internal Medicine

## 2013-04-02 VITALS — BP 130/88 | HR 76 | Temp 98.4°F | Ht 68.25 in | Wt 192.0 lb

## 2013-04-02 DIAGNOSIS — G4700 Insomnia, unspecified: Secondary | ICD-10-CM

## 2013-04-02 DIAGNOSIS — I10 Essential (primary) hypertension: Secondary | ICD-10-CM

## 2013-04-02 DIAGNOSIS — E785 Hyperlipidemia, unspecified: Secondary | ICD-10-CM

## 2013-04-02 DIAGNOSIS — E781 Pure hyperglyceridemia: Secondary | ICD-10-CM

## 2013-04-02 DIAGNOSIS — D751 Secondary polycythemia: Secondary | ICD-10-CM

## 2013-04-02 DIAGNOSIS — Z0000 Encounter for general adult medical examination without abnormal findings: Secondary | ICD-10-CM

## 2013-04-02 MED ORDER — ATORVASTATIN CALCIUM 10 MG PO TABS: 10.0000 mg | ORAL_TABLET | Freq: Every day | ORAL | Status: DC

## 2013-04-02 NOTE — Patient Instructions (Addendum)
Hypertriglyceridemia  Diet for High blood levels of Triglycerides Most fats in food are triglycerides. Triglycerides in your blood are stored as fat in your body. High levels of triglycerides in your blood may put you at a greater risk for heart disease and stroke.  Normal triglyceride levels are less than 150 mg/dL. Borderline high levels are 150-199 mg/dl. High levels are 200 - 499 mg/dL, and very high triglyceride levels are greater than 500 mg/dL. The decision to treat high triglycerides is generally based on the level. For people with borderline or high triglyceride levels, treatment includes weight loss and exercise. Drugs are recommended for people with very high triglyceride levels. Many people who need treatment for high triglyceride levels have metabolic syndrome. This syndrome is a collection of disorders that often include: insulin resistance, high blood pressure, blood clotting problems, high cholesterol and triglycerides. TESTING PROCEDURE FOR TRIGLYCERIDES  You should not eat 4 hours before getting your triglycerides measured. The normal range of triglycerides is between 10 and 250 milligrams per deciliter (mg/dl). Some people may have extreme levels (1000 or above), but your triglyceride level may be too high if it is above 150 mg/dl, depending on what other risk factors you have for heart disease.  People with high blood triglycerides may also have high blood cholesterol levels. If you have high blood cholesterol as well as high blood triglycerides, your risk for heart disease is probably greater than if you only had high triglycerides. High blood cholesterol is one of the main risk factors for heart disease. CHANGING YOUR DIET  Your weight can affect your blood triglyceride level. If you are more than 20% above your ideal body weight, you may be able to lower your blood triglycerides by losing weight. Eating less and exercising regularly is the best way to combat this. Fat provides more  calories than any other food. The best way to lose weight is to eat less fat. Only 30% of your total calories should come from fat. Less than 7% of your diet should come from saturated fat. A diet low in fat and saturated fat is the same as a diet to decrease blood cholesterol. By eating a diet lower in fat, you may lose weight, lower your blood cholesterol, and lower your blood triglyceride level.  Eating a diet low in fat, especially saturated fat, may also help you lower your blood triglyceride level. Ask your dietitian to help you figure how much fat you can eat based on the number of calories your caregiver has prescribed for you.  Exercise, in addition to helping with weight loss may also help lower triglyceride levels.   Alcohol can increase blood triglycerides. You may need to stop drinking alcoholic beverages.  Too much carbohydrate in your diet may also increase your blood triglycerides. Some complex carbohydrates are necessary in your diet. These may include bread, rice, potatoes, other starchy vegetables and cereals.  Reduce "simple" carbohydrates. These may include pure sugars, candy, honey, and jelly without losing other nutrients. If you have the kind of high blood triglycerides that is affected by the amount of carbohydrates in your diet, you will need to eat less sugar and less high-sugar foods. Your caregiver can help you with this.  Adding 2-4 grams of fish oil (EPA+ DHA) may also help lower triglycerides. Speak with your caregiver before adding any supplements to your regimen. Following the Diet  Maintain your ideal weight. Your caregivers can help you with a diet. Generally, eating less food and getting more   exercise will help you lose weight. Joining a weight control group may also help. Ask your caregivers for a good weight control group in your area.  Eat low-fat foods instead of high-fat foods. This can help you lose weight too.  These foods are lower in fat. Eat MORE of these:    Dried beans, peas, and lentils.  Egg whites.  Low-fat cottage cheese.  Fish.  Lean cuts of meat, such as round, sirloin, rump, and flank (cut extra fat off meat you fix).  Whole grain breads, cereals and pasta.  Skim and nonfat dry milk.  Low-fat yogurt.  Poultry without the skin.  Cheese made with skim or part-skim milk, such as mozzarella, parmesan, farmers', ricotta, or pot cheese. These are higher fat foods. Eat LESS of these:   Whole milk and foods made from whole milk, such as American, blue, cheddar, monterey jack, and swiss cheese  High-fat meats, such as luncheon meats, sausages, knockwurst, bratwurst, hot dogs, ribs, corned beef, ground pork, and regular ground beef.  Fried foods. Limit saturated fats in your diet. Substituting unsaturated fat for saturated fat may decrease your blood triglyceride level. You will need to read package labels to know which products contain saturated fats.  These foods are high in saturated fat. Eat LESS of these:   Fried pork skins.  Whole milk.  Skin and fat from poultry.  Palm oil.  Butter.  Shortening.  Cream cheese.  Bacon.  Margarines and baked goods made from listed oils.  Vegetable shortenings.  Chitterlings.  Fat from meats.  Coconut oil.  Palm kernel oil.  Lard.  Cream.  Sour cream.  Fatback.  Coffee whiteners and non-dairy creamers made with these oils.  Cheese made from whole milk. Use unsaturated fats (both polyunsaturated and monounsaturated) moderately. Remember, even though unsaturated fats are better than saturated fats; you still want a diet low in total fat.  These foods are high in unsaturated fat:   Canola oil.  Sunflower oil.  Mayonnaise.  Almonds.  Peanuts.  Pine nuts.  Margarines made with these oils.  Safflower oil.  Olive oil.  Avocados.  Cashews.  Peanut butter.  Sunflower seeds.  Soybean oil.  Peanut  oil.  Olives.  Pecans.  Walnuts.  Pumpkin seeds. Avoid sugar and other high-sugar foods. This will decrease carbohydrates without decreasing other nutrients. Sugar in your food goes rapidly to your blood. When there is excess sugar in your blood, your liver may use it to make more triglycerides. Sugar also contains calories without other important nutrients.  Eat LESS of these:   Sugar, brown sugar, powdered sugar, jam, jelly, preserves, honey, syrup, molasses, pies, candy, cakes, cookies, frosting, pastries, colas, soft drinks, punches, fruit drinks, and regular gelatin.  Avoid alcohol. Alcohol, even more than sugar, may increase blood triglycerides. In addition, alcohol is high in calories and low in nutrients. Ask for sparkling water, or a diet soft drink instead of an alcoholic beverage. Suggestions for planning and preparing meals   Bake, broil, grill or roast meats instead of frying.  Remove fat from meats and skin from poultry before cooking.  Add spices, herbs, lemon juice or vinegar to vegetables instead of salt, rich sauces or gravies.  Use a non-stick skillet without fat or use no-stick sprays.  Cool and refrigerate stews and broth. Then remove the hardened fat floating on the surface before serving.  Refrigerate meat drippings and skim off fat to make low-fat gravies.  Serve more fish.  Use less butter,   margarine and other high-fat spreads on bread or vegetables.  Use skim or reconstituted non-fat dry milk for cooking.  Cook with low-fat cheeses.  Substitute low-fat yogurt or cottage cheese for all or part of the sour cream in recipes for sauces, dips or congealed salads.  Use half yogurt/half mayonnaise in salad recipes.  Substitute evaporated skim milk for cream. Evaporated skim milk or reconstituted non-fat dry milk can be whipped and substituted for whipped cream in certain recipes.  Choose fresh fruits for dessert instead of high-fat foods such as pies or  cakes. Fruits are naturally low in fat. When Dining Out   Order low-fat appetizers such as fruit or vegetable juice, pasta with vegetables or tomato sauce.  Select clear, rather than cream soups.  Ask that dressings and gravies be served on the side. Then use less of them.  Order foods that are baked, broiled, poached, steamed, stir-fried, or roasted.  Ask for margarine instead of butter, and use only a small amount.  Drink sparkling water, unsweetened tea or coffee, or diet soft drinks instead of alcohol or other sweet beverages. QUESTIONS AND ANSWERS ABOUT OTHER FATS IN THE BLOOD: SATURATED FAT, TRANS FAT, AND CHOLESTEROL What is trans fat? Trans fat is a type of fat that is formed when vegetable oil is hardened through a process called hydrogenation. This process helps makes foods more solid, gives them shape, and prolongs their shelf life. Trans fats are also called hydrogenated or partially hydrogenated oils.  What do saturated fat, trans fat, and cholesterol in foods have to do with heart disease? Saturated fat, trans fat, and cholesterol in the diet all raise the level of LDL "bad" cholesterol in the blood. The higher the LDL cholesterol, the greater the risk for coronary heart disease (CHD). Saturated fat and trans fat raise LDL similarly.  What foods contain saturated fat, trans fat, and cholesterol? High amounts of saturated fat are found in animal products, such as fatty cuts of meat, chicken skin, and full-fat dairy products like butter, whole milk, cream, and cheese, and in tropical vegetable oils such as palm, palm kernel, and coconut oil. Trans fat is found in some of the same foods as saturated fat, such as vegetable shortening, some margarines (especially hard or stick margarine), crackers, cookies, baked goods, fried foods, salad dressings, and other processed foods made with partially hydrogenated vegetable oils. Small amounts of trans fat also occur naturally in some animal  products, such as milk products, beef, and lamb. Foods high in cholesterol include liver, other organ meats, egg yolks, shrimp, and full-fat dairy products. How can I use the new food label to make heart-healthy food choices? Check the Nutrition Facts panel of the food label. Choose foods lower in saturated fat, trans fat, and cholesterol. For saturated fat and cholesterol, you can also use the Percent Daily Value (%DV): 5% DV or less is low, and 20% DV or more is high. (There is no %DV for trans fat.) Use the Nutrition Facts panel to choose foods low in saturated fat and cholesterol, and if the trans fat is not listed, read the ingredients and limit products that list shortening or hydrogenated or partially hydrogenated vegetable oil, which tend to be high in trans fat. POINTS TO REMEMBER:   Discuss your risk for heart disease with your caregivers, and take steps to reduce risk factors.  Change your diet. Choose foods that are low in saturated fat, trans fat, and cholesterol.  Add exercise to your daily routine if   it is not already being done. Participate in physical activity of moderate intensity, like brisk walking, for at least 30 minutes on most, and preferably all days of the week. No time? Break the 30 minutes into three, 10-minute segments during the day.  Stop smoking. If you do smoke, contact your caregiver to discuss ways in which they can help you quit.  Do not use street drugs.  Maintain a normal weight.  Maintain a healthy blood pressure.  Keep up with your blood work for checking the fats in your blood as directed by your caregiver. Document Released: 11/16/2003 Document Revised: 07/30/2011 Document Reviewed: 06/13/2008 [facility] Patient Information 2014 [place]. Fat and Cholesterol Control Diet Fat and cholesterol levels in your blood and organs are influenced by your diet. High levels of fat and cholesterol may lead to diseases of the heart, small and large blood  vessels, gallbladder, liver, and pancreas. CONTROLLING FAT AND CHOLESTEROL WITH DIET Although exercise and lifestyle factors are important, your diet is key. That is because certain foods are known to raise cholesterol and others to lower it. The goal is to balance foods for their effect on cholesterol and more importantly, to replace saturated and trans fat with other types of fat, such as monounsaturated fat, polyunsaturated fat, and omega-3 fatty acids. On average, a person should consume no more than 15 to 17 g of saturated fat daily. Saturated and trans fats are considered "bad" fats, and they will raise LDL cholesterol. Saturated fats are primarily found in animal products such as meats, butter, and cream. However, that does not mean you need to give up all your favorite foods. Today, there are good tasting, low-fat, low-cholesterol substitutes for most of the things you like to eat. Choose low-fat or nonfat alternatives. Choose round or loin cuts of red meat. These types of cuts are lowest in fat and cholesterol. Chicken (without the skin), fish, veal, and ground [place] breast are great choices. Eliminate fatty meats, such as hot dogs and salami. Even shellfish have little or no saturated fat. Have a 3 oz (85 g) portion when you eat lean meat, poultry, or fish. Trans fats are also called "partially hydrogenated oils." They are oils that have been scientifically manipulated so that they are solid at room temperature resulting in a longer shelf life and improved taste and texture of foods in which they are added. Trans fats are found in stick margarine, some tub margarines, cookies, crackers, and baked goods.  When baking and cooking, oils are a great substitute for butter. The monounsaturated oils are especially beneficial since it is believed they lower LDL and raise HDL. The oils you should avoid entirely are saturated tropical oils, such as coconut and palm.  Remember to eat a lot from food groups  that are naturally free of saturated and trans fat, including fish, fruit, vegetables, beans, grains (barley, rice, couscous, bulgur wheat), and pasta (without cream sauces).  IDENTIFYING FOODS THAT LOWER FAT AND CHOLESTEROL  Soluble fiber may lower your cholesterol. This type of fiber is found in fruits such as apples, vegetables such as broccoli, potatoes, and carrots, legumes such as beans, peas, and lentils, and grains such as barley. Foods fortified with plant sterols (phytosterol) may also lower cholesterol. You should eat at least 2 g per day of these foods for a cholesterol lowering effect.  Read package labels to identify low-saturated fats, trans fat free, and low-fat foods at the supermarket. Select cheeses that have only 2 to 3 g  saturated fat per ounce. Use a heart-healthy tub margarine that is free of trans fats or partially hydrogenated oil. When buying baked goods (cookies, crackers), avoid partially hydrogenated oils. Breads and muffins should be made from whole grains (whole-wheat or whole oat flour, instead of "flour" or "enriched flour"). Buy non-creamy canned soups with reduced salt and no added fats.  FOOD PREPARATION TECHNIQUES  Never deep-fry. If you must fry, either stir-fry, which uses very little fat, or use non-stick cooking sprays. When possible, broil, bake, or roast meats, and steam vegetables. Instead of putting butter or margarine on vegetables, use lemon and herbs, applesauce, and cinnamon (for squash and sweet potatoes). Use nonfat yogurt, salsa, and low-fat dressings for salads.  LOW-SATURATED FAT / LOW-FAT FOOD SUBSTITUTES Meats / Saturated Fat (g)  Avoid: Steak, marbled (3 oz/85 g) / 11 g  Choose: Steak, lean (3 oz/85 g) / 4 g  Avoid: Hamburger (3 oz/85 g) / 7 g  Choose: Hamburger, lean (3 oz/85 g) / 5 g  Avoid: Ham (3 oz/85 g) / 6 g  Choose: Ham, lean cut (3 oz/85 g) / 2.4 g  Avoid: Chicken, with skin, dark meat (3 oz/85 g) / 4 g  Choose: Chicken, skin  removed, dark meat (3 oz/85 g) / 2 g  Avoid: Chicken, with skin, light meat (3 oz/85 g) / 2.5 g  Choose: Chicken, skin removed, light meat (3 oz/85 g) / 1 g Dairy / Saturated Fat (g)  Avoid: Whole milk (1 cup) / 5 g  Choose: Low-fat milk, 2% (1 cup) / 3 g  Choose: Low-fat milk, 1% (1 cup) / 1.5 g  Choose: Skim milk (1 cup) / 0.3 g  Avoid: Hard cheese (1 oz/28 g) / 6 g  Choose: Skim milk cheese (1 oz/28 g) / 2 to 3 g  Avoid: Cottage cheese, 4% fat (1 cup) / 6.5 g  Choose: Low-fat cottage cheese, 1% fat (1 cup) / 1.5 g  Avoid: Ice cream (1 cup) / 9 g  Choose: Sherbet (1 cup) / 2.5 g  Choose: Nonfat frozen yogurt (1 cup) / 0.3 g  Choose: Frozen fruit bar / trace  Avoid: Whipped cream (1 tbs) / 3.5 g  Choose: Nondairy whipped topping (1 tbs) / 1 g Condiments / Saturated Fat (g)  Avoid: Mayonnaise (1 tbs) / 2 g  Choose: Low-fat mayonnaise (1 tbs) / 1 g  Avoid: Butter (1 tbs) / 7 g  Choose: Extra light margarine (1 tbs) / 1 g  Avoid: Coconut oil (1 tbs) / 11.8 g  Choose: Olive oil (1 tbs) / 1.8 g  Choose: Corn oil (1 tbs) / 1.7 g  Choose: Safflower oil (1 tbs) / 1.2 g  Choose: Sunflower oil (1 tbs) / 1.4 g  Choose: Soybean oil (1 tbs) / 2.4 g  Choose: Canola oil (1 tbs) / 1 g Document Released: 01/28/2005 Document Revised: 05/25/2012 Document Reviewed: 07/19/2010 [employer] Patient Information 2014 [place], [place].

## 2013-04-02 NOTE — Assessment & Plan Note (Signed)
Hgb slightly elevated [facility] agrees to [facility] monthly

## 2013-04-02 NOTE — Assessment & Plan Note (Signed)
Medicinal therapy ineffective Will order sleep study

## 2013-04-02 NOTE — Addendum Note (Signed)
Addended by: Jearld Fenton on: 04/02/2013 12:16 PM   Modules accepted: Orders

## 2013-04-02 NOTE — Assessment & Plan Note (Signed)
Better control on HCTZ Will continue avapro as weel

## 2013-04-02 NOTE — Progress Notes (Signed)
Pre visit review using our clinic review tool, if applicable. No additional management support is needed unless otherwise documented below in the visit note. 

## 2013-04-02 NOTE — Assessment & Plan Note (Signed)
  Restarted lipitor

## 2013-04-02 NOTE — Progress Notes (Signed)
Subjective:    Patient ID: Derrick Kelley, male    DOB: 1960-10-02, 53 y.o.   MRN: [id number]  HPI  Pt presents to the clinic today for his annual physical.  He was started on HCTZ at his last visit, in addition to his avapro. His BP is much better controlled. He denies side effects from the medication.  His labs revealed a slightly elevated hemoglobin. He does have a history of polycythemia. He was going to the cancer center to give blood once a month. He has not done this in some time. He prefers not to go back to the cancer center. He would rather go to the [facility] to donate blood once per month.  He wants to make me aware that he is now seeing Dr. Mosie Epstein at [facility]. He will be managing his testosterone and his PSA screening.  His biggest concern is insomnia. He is taking 2 ambien per night. He is also taking 2 clonazepam on top of that. He is still not sleeping well. He would like a sleep study. He says that his wife tells him that he snores a lot at night and also has periods where he stops breathing.  Flu: 2013 Tetanus: 2010 PSA: 2015 Colonoscopy: has one scheduled in the next few months Dentist: as needed Eye Doctor: as needed   Review of Systems      Past Medical History  Diagnosis Date  . BACK PAIN, CHRONIC 05/11/2009    bone on bone;hx of herniated disc  . Flushing 05/11/2009  . HYPERLIPIDEMIA     takes Lipitor daily  . HYPOGONADISM 05/11/2009    takes Testosterone every 14days  . INSOMNIA-SLEEP DISORDER-UNSPEC 10/10/2009    takes Ambien nightly  . POLYCYTHEMIA 05/11/2009  . SCIATICA, RIGHT 09/20/2009  . SINUSITIS- ACUTE-NOS 10/10/2009  . URI 05/11/2009  . Arthritis   . Chronic back pain     hx buldging disc;  . Hemorrhoids     hx of  . HYPERTENSION     takes Avapro,Amlodipine,and Lisinopril daily  . Headache(784.0) 10/10/2009    cluster;last one about a yr ago  . Joint pain   . Joint pain   . GERD 05/11/2009    but doesn't require meds  .  DEPRESSION     takes Zoloft daily    Current Outpatient Prescriptions  Medication Sig Dispense Refill  . buprenorphine-naloxone (SUBOXONE) 8-2 MG SUBL SL tablet Place 1 tablet under the tongue 3 (three) times daily.      Marland Kitchen gabapentin (NEURONTIN) 300 MG capsule Take 1 capsule (300 mg total) by mouth 3 (three) times daily as needed.  90 capsule  5  . hydrochlorothiazide (MICROZIDE) 12.5 MG capsule Take 1 capsule (12.5 mg total) by mouth daily.  30 capsule  0  . irbesartan (AVAPRO) 150 MG tablet Take 1 tablet (150 mg total) by mouth daily.  90 tablet  3  . meloxicam (MOBIC) 15 MG tablet Take 15 mg by mouth daily.      . sertraline (ZOLOFT) 100 MG tablet TAKE 1 & 1/2 TABLETS BY MOUTH DAILY  135 tablet  3  . testosterone cypionate (DEPOTESTOTERONE CYPIONATE) 200 MG/ML injection Inject 1 mL (200 mg total) into the muscle every 14 (fourteen) days. 1 cc biweekly IM (self Injected)  Last injection was on 04/12/11  10 mL  5  . tiZANidine (ZANAFLEX) 4 MG tablet Take 4 mg by mouth 2 (two) times daily as needed for muscle spasms.      Marland Kitchen  zolpidem (AMBIEN) 10 MG tablet Take 1 tablet (10 mg total) by mouth at bedtime as needed for sleep.  30 tablet  0  . atorvastatin (LIPITOR) 10 MG tablet Take 5 mg by mouth daily.       No current facility-administered medications for this visit.    Allergies  Allergen Reactions  . Ambien [Zolpidem Tartrate] Other (See Comments)    Memory dysfunction  . Bupropion Hcl     REACTION: nightmares    Family History  Problem Relation Age of Onset  . Alcohol abuse Father   . Cancer Father     prostate  . Alcohol abuse Maternal Aunt   . Alcohol abuse Maternal Uncle   . Cancer Paternal Uncle     prostate  . Cancer Maternal Grandmother     ovarian cancer  . Alcohol abuse Other   . Anesthesia problems Neg Hx   . Hypotension Neg Hx   . Malignant hyperthermia Neg Hx   . Pseudochol deficiency Neg Hx     History   Social History  . Marital Status: Married    Spouse  Name: N/A    Number of Children: 2  . Years of Education: N/A   Occupational History  . CONSTRUCTION WORKER    Social History Main Topics  . Smoking status: Never Smoker   . Smokeless tobacco: Never Used  . Alcohol Use: Yes     Comment: occasional  . Drug Use: No     Comment: former cocaine, none for years  . Sexual Activity: Yes   Other Topics Concern  . Not on file   Social History Narrative  . No narrative on file     Constitutional: Denies fever, malaise, fatigue, headache or abrupt weight changes.  HEENT: Pt reports teeth pain. Denies eye pain, eye redness, ear pain, ringing in the ears, wax buildup, runny nose, nasal congestion, bloody nose, or sore throat. Respiratory: Denies difficulty breathing, shortness of breath, cough or sputum production.   Cardiovascular: Denies chest pain, chest tightness, palpitations or swelling in the hands or feet.  Gastrointestinal: Denies abdominal pain, bloating, constipation, diarrhea or blood in the stool.  GU: Pt reports hesitancy. Denies urgency, frequency, pain with urination, burning sensation, blood in urine, odor or discharge. Musculoskeletal: Pt reports chronic back pain. Denies decrease in range of motion, difficulty with gait, muscle pain or joint pain and swelling.  Skin: Denies redness, rashes, lesions or ulcercations.  Neurological: Denies dizziness, difficulty with memory, difficulty with speech or problems with balance and coordination.   No other specific complaints in a complete review of systems (except as listed in HPI above).  Objective:   Physical Exam   BP 130/88  Pulse 76  Temp(Src) 98.4 F (36.9 C) (Oral)  Ht 5' 8.25" (1.734 m)  Wt 192 lb (87.091 kg)  BMI 28.97 kg/m2  SpO2 98% Wt Readings from Last 3 Encounters:  04/02/13 192 lb (87.091 kg)  03/17/13 192 lb (87.091 kg)  05/07/12 198 lb (89.812 kg)    General: Appears his stated age, well developed, well nourished in NAD. Skin: Warm, dry and intact.  No rashes, lesions or ulcerations noted. HEENT: Head: normal shape and size; Eyes: sclera white, no icterus, conjunctiva pink, PERRLA and EOMs intact; Ears: Tm's gray and intact, normal light reflex; Nose: mucosa pink and moist, septum midline; Throat/Mouth: Teeth present, mucosa pink and moist, no exudate, lesions or ulcerations noted.  Neck: Normal range of motion. Neck supple, trachea midline. No massses, lumps or  thyromegaly present.  Cardiovascular: Normal rate and rhythm. S1,S2 noted.  No murmur, rubs or gallops noted. No JVD or BLE edema. No carotid bruits noted. Pulmonary/Chest: Normal effort and positive vesicular breath sounds. No respiratory distress. No wheezes, rales or ronchi noted.  Abdomen: Soft and mildly tender in the left lower quadrant. Normal bowel sounds, no bruits noted. No distention or masses noted. Liver, spleen and kidneys non palpable. Musculoskeletal: Normal range of motion. No signs of joint swelling. No difficulty with gait.  Neurological: Alert and oriented. Cranial nerves II-XII intact. Coordination normal. +DTRs bilaterally. Psychiatric: Mood and affect normal. Behavior is normal. Judgment and thought content normal.    BMET    Component Value Date/Time   NA 142 03/17/2013 1442   K 4.6 03/17/2013 1442   CL 102 03/17/2013 1442   CO2 33* 03/17/2013 1442   GLUCOSE 104* 03/17/2013 1442   BUN 18 03/17/2013 1442   CREATININE 1.5 03/17/2013 1442   CALCIUM 10.2 03/17/2013 1442   GFRNONAA 55* 12/25/2011 1222   GFRAA 64* 12/25/2011 1222    Lipid Panel     Component Value Date/Time   CHOL 222* 03/17/2013 1442   TRIG 424.0* 03/17/2013 1442   HDL 33.30* 03/17/2013 1442   CHOLHDL 7 03/17/2013 1442   VLDL 84.8* 03/17/2013 1442   LDLCALC 82 11/06/2011 1301    CBC    Component Value Date/Time   WBC 9.4 03/17/2013 1442   WBC 7.4 01/02/2010 1416   RBC 5.37 03/17/2013 1442   RBC 4.86 01/02/2010 1416   HGB 17.5* 03/17/2013 1442   HGB Unable to Determine 01/02/2010 1416   HCT 51.8 03/17/2013  1442   HCT 41.2 01/02/2010 1416   PLT 227.0 03/17/2013 1442   PLT 208 01/02/2010 1416   MCV 96.4 03/17/2013 1442   MCV 84.8 01/02/2010 1416   MCH 31.9 12/25/2011 1222   MCH Unable to determine 01/02/2010 1416   MCHC 33.9 03/17/2013 1442   MCHC Unable to determine 01/02/2010 1416   RDW 13.9 03/17/2013 1442   RDW 12.9 01/02/2010 1416   LYMPHSABS 1.6 12/25/2011 1222   LYMPHSABS 1.6 01/02/2010 1416   MONOABS 0.5 12/25/2011 1222   MONOABS 0.4 01/02/2010 1416   EOSABS 0.0 12/25/2011 1222   EOSABS 0.1 01/02/2010 1416   BASOSABS 0.0 12/25/2011 1222   BASOSABS 0.0 01/02/2010 1416          Assessment & Plan:   Preventative Health Maintenance:  Labs reviewed Advised him to work on diet and exercise He is getting a colon screen in a few weeks Encouraged him to visit a dentist because he is having tooth pain  RTC in 6 months for followup, sooner if needed

## 2013-04-13 ENCOUNTER — Telehealth: Payer: Self-pay

## 2013-04-13 ENCOUNTER — Other Ambulatory Visit: Payer: Self-pay | Admitting: Internal Medicine

## 2013-04-13 NOTE — Telephone Encounter (Signed)
A user error has taken place: encounter opened in error, closed for administrative reasons.

## 2013-04-15 ENCOUNTER — Other Ambulatory Visit: Payer: Self-pay

## 2013-04-15 NOTE — Telephone Encounter (Signed)
Will not refill if not effective. Has he had his sleep study?

## 2013-04-15 NOTE — Telephone Encounter (Signed)
Received fax requesting a refill on Ambien and Clonazepam--but at last OV he states they were not working for him--please advise if it is okay to call in

## 2013-04-16 ENCOUNTER — Other Ambulatory Visit: Payer: Self-pay | Admitting: Internal Medicine

## 2013-04-16 NOTE — Telephone Encounter (Signed)
I don't have either of these on his med list

## 2013-04-19 ENCOUNTER — Telehealth: Payer: Self-pay

## 2013-04-19 ENCOUNTER — Other Ambulatory Visit: Payer: Self-pay

## 2013-04-19 NOTE — Telephone Encounter (Signed)
Faxed a form again denying the Rx refills for Ambien and Clonazepam

## 2013-04-19 NOTE — Telephone Encounter (Signed)
Called pt to state as discussed at previous OV he needed a sleep study due to the fact he states the [place] and clonazepam is not helping with his Sx of insomnia

## 2013-04-19 NOTE — Telephone Encounter (Signed)
The medications are under the "history" tab because he stated that he was no taking them anymore--so they are in there-- but i will talk to him when he calls back

## 2013-04-27 ENCOUNTER — Encounter: Payer: Self-pay | Admitting: Internal Medicine

## 2013-04-27 ENCOUNTER — Ambulatory Visit (INDEPENDENT_AMBULATORY_CARE_PROVIDER_SITE_OTHER): Payer: 59 | Admitting: Internal Medicine

## 2013-04-27 ENCOUNTER — Encounter: Payer: Self-pay | Admitting: Radiology

## 2013-04-27 VITALS — BP 160/110 | HR 95 | Temp 97.9°F | Wt 190.0 lb

## 2013-04-27 DIAGNOSIS — I10 Essential (primary) hypertension: Secondary | ICD-10-CM

## 2013-04-27 DIAGNOSIS — G4700 Insomnia, unspecified: Secondary | ICD-10-CM

## 2013-04-27 MED ORDER — IRBESARTAN 75 MG PO TABS: 75.0000 mg | ORAL_TABLET | Freq: Every day | ORAL | Status: DC

## 2013-04-27 NOTE — Progress Notes (Signed)
Subjective:    Patient ID: Derrick Kelley, male    DOB: Oct 12, 1960, 53 y.o.   MRN: [id number]  HPI  Pt presents to the clinic to discuss his BP medication. He reports that while on his BP medications his blood pressure is 120/80. But when he stands up, he gets very dizzy and lightheaded. He reports he takes his blood pressure medicine when he feels this way at it is 80/50. When his blood pressure was this low, he did not feel the need to call EMS or go to the ER. At that point he stopped all of his blood pressure medication because he thought that might be causing his blood pressure issues. Coincidently at that time, he abruptly stopped his suboxone. He reports that the provider that was providing it told him that he was no longer going to prescribe it and give him tramadol instead. He denies withdrawel symptoms. He denies chest pain, chest tightness or shortness of breath. Additionally, he would like his clonazepam refilled today. He reports Dr. Jenny Reichmann put him on this instead of ambien to help him sleep and it works so much better than the [place].  Review of Systems      Past Medical History  Diagnosis Date  . BACK PAIN, CHRONIC 05/11/2009    bone on bone;hx of herniated disc  . Flushing 05/11/2009  . HYPERLIPIDEMIA     takes Lipitor daily  . HYPOGONADISM 05/11/2009    takes Testosterone every 14days  . INSOMNIA-SLEEP DISORDER-UNSPEC 10/10/2009    takes Ambien nightly  . POLYCYTHEMIA 05/11/2009  . SCIATICA, RIGHT 09/20/2009  . SINUSITIS- ACUTE-NOS 10/10/2009  . URI 05/11/2009  . Arthritis   . Chronic back pain     hx buldging disc;  . Hemorrhoids     hx of  . HYPERTENSION     takes Avapro,Amlodipine,and Lisinopril daily  . Headache(784.0) 10/10/2009    cluster;last one about a yr ago  . Joint pain   . Joint pain   . GERD 05/11/2009    but doesn't require meds  . DEPRESSION     takes Zoloft daily    Current Outpatient Prescriptions  Medication Sig Dispense Refill  .  atorvastatin (LIPITOR) 10 MG tablet Take 1 tablet (10 mg total) by mouth daily.  30 tablet  5  . buprenorphine-naloxone (SUBOXONE) 8-2 MG SUBL SL tablet Place 1 tablet under the tongue 3 (three) times daily.      Marland Kitchen gabapentin (NEURONTIN) 300 MG capsule Take 1 capsule (300 mg total) by mouth 3 (three) times daily as needed.  90 capsule  5  . meloxicam (MOBIC) 15 MG tablet Take 15 mg by mouth daily.      . sertraline (ZOLOFT) 100 MG tablet TAKE 1 & 1/2 TABLETS BY MOUTH DAILY  135 tablet  3  . testosterone cypionate (DEPOTESTOTERONE CYPIONATE) 200 MG/ML injection Inject 1 mL (200 mg total) into the muscle every 14 (fourteen) days. 1 cc biweekly IM (self Injected)  Last injection was on 04/12/11  10 mL  5  . tiZANidine (ZANAFLEX) 4 MG tablet Take 4 mg by mouth 2 (two) times daily as needed for muscle spasms.      . irbesartan (AVAPRO) 75 MG tablet Take 1 tablet (75 mg total) by mouth daily.  30 tablet  0   No current facility-administered medications for this visit.    Allergies  Allergen Reactions  . Ambien [Zolpidem Tartrate] Other (See Comments)    Memory dysfunction  . Bupropion Hcl  REACTION: nightmares    Family History  Problem Relation Age of Onset  . Alcohol abuse Father   . Cancer Father     prostate  . Alcohol abuse Maternal Aunt   . Alcohol abuse Maternal Uncle   . Cancer Paternal Uncle     prostate  . Cancer Maternal Grandmother     ovarian cancer  . Alcohol abuse Other   . Anesthesia problems Neg Hx   . Hypotension Neg Hx   . Malignant hyperthermia Neg Hx   . Pseudochol deficiency Neg Hx     History   Social History  . Marital Status: Married    Spouse Name: N/A    Number of Children: 2  . Years of Education: N/A   Occupational History  . CONSTRUCTION WORKER    Social History Main Topics  . Smoking status: Never Smoker   . Smokeless tobacco: Never Used  . Alcohol Use: Yes     Comment: occasional  . Drug Use: No     Comment: former cocaine, none for  years  . Sexual Activity: Yes   Other Topics Concern  . Not on file   Social History Narrative  . No narrative on file     Constitutional: Denies fever, malaise, fatigue, headache or abrupt weight changes.  HEENT: Denies eye pain, eye redness, ear pain, ringing in the ears, wax buildup, runny nose, nasal congestion, bloody nose, or sore throat. Respiratory: Denies difficulty breathing, shortness of breath, cough or sputum production.   Cardiovascular: Denies chest pain, chest tightness, palpitations or swelling in the hands or feet.  Neurological: Denies dizziness, difficulty with memory, difficulty with speech or problems with balance and coordination.   No other specific complaints in a complete review of systems (except as listed in HPI above).  Objective:   Physical Exam   BP 160/110  Pulse 95  Temp(Src) 97.9 F (36.6 C) (Oral)  Wt 190 lb (86.183 kg)  SpO2 97% Wt Readings from Last 3 Encounters:  04/27/13 190 lb (86.183 kg)  04/02/13 192 lb (87.091 kg)  03/17/13 192 lb (87.091 kg)    General: Appears his stated age, well developed, well nourished in NAD.  Cardiovascular: Normal rate and rhythm. S1,S2 noted.  No murmur, rubs or gallops noted. No JVD or BLE edema. No carotid bruits noted. Pulmonary/Chest: Normal effort and positive vesicular breath sounds. No respiratory distress. No wheezes, rales or ronchi noted.  Neurological: Alert and oriented. Cranial nerves II-XII intact. Coordination normal. +DTRs bilaterally.   BMET    Component Value Date/Time   NA 142 03/17/2013 1442   K 4.6 03/17/2013 1442   CL 102 03/17/2013 1442   CO2 33* 03/17/2013 1442   GLUCOSE 104* 03/17/2013 1442   BUN 18 03/17/2013 1442   CREATININE 1.5 03/17/2013 1442   CALCIUM 10.2 03/17/2013 1442   GFRNONAA 55* 12/25/2011 1222   GFRAA 64* 12/25/2011 1222    Lipid Panel     Component Value Date/Time   CHOL 222* 03/17/2013 1442   TRIG 424.0* 03/17/2013 1442   HDL 33.30* 03/17/2013 1442   CHOLHDL 7  03/17/2013 1442   VLDL 84.8* 03/17/2013 1442   LDLCALC 82 11/06/2011 1301    CBC    Component Value Date/Time   WBC 9.4 03/17/2013 1442   WBC 7.4 01/02/2010 1416   RBC 5.37 03/17/2013 1442   RBC 4.86 01/02/2010 1416   HGB 17.5* 03/17/2013 1442   HGB Unable to Determine 01/02/2010 1416   HCT 51.8 03/17/2013  1442   HCT 41.2 01/02/2010 1416   PLT 227.0 03/17/2013 1442   PLT 208 01/02/2010 1416   MCV 96.4 03/17/2013 1442   MCV 84.8 01/02/2010 1416   MCH 31.9 12/25/2011 1222   MCH Unable to determine 01/02/2010 1416   MCHC 33.9 03/17/2013 1442   MCHC Unable to determine 01/02/2010 1416   RDW 13.9 03/17/2013 1442   RDW 12.9 01/02/2010 1416   LYMPHSABS 1.6 12/25/2011 1222   LYMPHSABS 1.6 01/02/2010 1416   MONOABS 0.5 12/25/2011 1222   MONOABS 0.4 01/02/2010 1416   EOSABS 0.0 12/25/2011 1222   EOSABS 0.1 01/02/2010 1416   BASOSABS 0.0 12/25/2011 1222   BASOSABS 0.0 01/02/2010 1416    Hgb A1C No results found for this basename: HGBA1C        Assessment & Plan:   Hypertension:  Orthostatics today did not show a drop I wonder if his BP issues had something to do with him abruptly stopping the suboxone I advised him to restart his Avapro, we will cut the dose in half to 75 mg daily. Take it daily x 2 weeks, RTC in 2 weeks to reevaluate blood pressure.  Insomnia:  Advised him that he would need to establish a contract with [facility] and give a urine sample for UDS. He seemed really unsure about giving a urine sample today but eventually agreed  Will refill clonazepam based on results of UDS  RTC in 2 weeks

## 2013-04-27 NOTE — Progress Notes (Signed)
Pre visit review using our clinic review tool, if applicable. No additional management support is needed unless otherwise documented below in the visit note. 

## 2013-04-27 NOTE — Patient Instructions (Addendum)

## 2013-05-06 ENCOUNTER — Institutional Professional Consult (permissible substitution): Payer: 59 | Admitting: Pulmonary Disease

## 2013-05-07 ENCOUNTER — Ambulatory Visit (INDEPENDENT_AMBULATORY_CARE_PROVIDER_SITE_OTHER): Payer: Managed Care, Other (non HMO) | Admitting: Pulmonary Disease

## 2013-05-07 ENCOUNTER — Encounter: Payer: Self-pay | Admitting: Pulmonary Disease

## 2013-05-07 VITALS — BP 152/98 | HR 89 | Temp 98.2°F | Ht 68.0 in | Wt 192.2 lb

## 2013-05-07 DIAGNOSIS — D751 Secondary polycythemia: Secondary | ICD-10-CM

## 2013-05-07 DIAGNOSIS — R0989 Other specified symptoms and signs involving the circulatory and respiratory systems: Secondary | ICD-10-CM

## 2013-05-07 DIAGNOSIS — R0683 Snoring: Secondary | ICD-10-CM

## 2013-05-07 DIAGNOSIS — G4700 Insomnia, unspecified: Secondary | ICD-10-CM

## 2013-05-07 DIAGNOSIS — R0609 Other forms of dyspnea: Secondary | ICD-10-CM

## 2013-05-07 NOTE — Assessment & Plan Note (Signed)
He reports snoring, sleep disruption, witnessed apnea, and daytime sleepiness.  He has history of polycythemia.  He is on chronic pain medications due to back problems.  I am concerned he could have sleep disordered breathing.  We discussed how sleep apnea can affect various health problems including risks for hypertension, cardiovascular disease, and diabetes.  We also discussed how sleep disruption can increase risks for accident, such as while driving.  Weight loss as a means of improving sleep apnea was also reviewed.  Additional treatment options discussed were CPAP therapy, oral appliance, and surgical intervention.  To further assess will arrange for in lab sleep study.

## 2013-05-07 NOTE — Patient Instructions (Signed)
Will arrange for sleep study Will call to arrange for follow up after sleep study reviewed 

## 2013-05-07 NOTE — Progress Notes (Deleted)
   Subjective:    Patient ID: Derrick Kelley, male    DOB: 1960-04-24, 53 y.o.   MRN: [id number]  HPI    Review of Systems  Constitutional: Negative for fever and unexpected weight change.  HENT: Negative for congestion, dental problem, ear pain, nosebleeds, postnasal drip, rhinorrhea, sinus pressure, sneezing, sore throat and trouble swallowing.   Eyes: Negative for redness and itching.  Respiratory: Negative for cough, chest tightness, shortness of breath and wheezing.   Cardiovascular: Negative for palpitations and leg swelling.  Gastrointestinal: Negative for nausea and vomiting.  Genitourinary: Negative for dysuria.  Musculoskeletal: Negative for joint swelling.  Skin: Negative for rash.  Neurological: Negative for headaches.  Hematological: Does not bruise/bleed easily.  Psychiatric/Behavioral: Negative for dysphoric mood. The patient is not nervous/anxious.        Objective:   Physical Exam        Assessment & Plan:

## 2013-05-07 NOTE — Assessment & Plan Note (Signed)
Some of his current symptoms of "insomnia" may in fact be related to sleep apnea.  Will defer further assessment for this until after review of his sleep study.  Discussed proper sleep hygiene, stimulus control, sleep restriction, and relaxation techniques.

## 2013-05-07 NOTE — Progress Notes (Signed)
Chief Complaint  Patient presents with  . sleep consult    referred by Dr. Webb Silversmith for sleep apnea, pt states referral is for insomnia. Epworth=0    History of Present Illness: Derrick Kelley is a 53 y.o. male for evaluation of sleep problems.  He has noticed trouble sleeping for years.  He has trouble falling asleep.  He usually can stay asleep.  He has tried [place] and clonazepam before.  Most recently he has been using OTC meds.  He takes these 1 hour before going to bed.  He snores while asleep.  He has been told he stops breathing while asleep.  He feels like he is holding his breath when asleep.  He gets crazy dreams, and will wake up punching and kicking while asleep.  He goes to sleep at 11 pm.  He falls asleep after an hour.  He wakes up 2 or 3 times to use the bathroom.  He gets out of bed at 9 am.  He feels tired in the morning.  He denies morning headache.  He drinks a cup of coffee in the morning.  He denies sleep walking, sleep talking, bruxism, or nightmares.  There is no history of restless legs.  He denies sleep hallucinations, sleep paralysis, or cataplexy.  The Epworth score is 0 out of 24.  He has history of polycythemia and Hb from 03/17/13 was 17.5.   Derrick Kelley  has a past medical history of BACK PAIN, CHRONIC (05/11/2009); Flushing (05/11/2009); HYPERLIPIDEMIA; HYPOGONADISM (05/11/2009); INSOMNIA-SLEEP DISORDER-UNSPEC (10/10/2009); POLYCYTHEMIA (05/11/2009); SCIATICA, RIGHT (09/20/2009); SINUSITIS- ACUTE-NOS (10/10/2009); URI (05/11/2009); Arthritis; Chronic back pain; Hemorrhoids; HYPERTENSION; Headache(784.0) (10/10/2009); Joint pain; Joint pain; GERD (05/11/2009); and DEPRESSION.  Derrick Kelley  has past surgical history that includes left clavicle fracture; hx eye surgury 2001 (many yrs ago); hx back surgury lumbar fusion (2007); hx left shoulder surgury (2002 and August 10, 2009); Mouth surgery; Back surgery (2013); left shoulder surgery; Anterior lumbar  fusion (01/02/2012); and Abdominal exposure (01/02/2012).  Prior to Admission medications   Medication Sig Start Date End Date Taking? Authorizing Provider  atorvastatin (LIPITOR) 10 MG tablet Take 1 tablet (10 mg total) by mouth daily. 04/02/13 04/02/14 Yes Webb Silversmith, NP  gabapentin (NEURONTIN) 300 MG capsule Take 300 mg by mouth 3 (three) times daily as needed. Up to 2400mg  in evening per pt. 10/27/12  Yes Biagio Borg, MD  irbesartan (AVAPRO) 75 MG tablet Take 1 tablet (75 mg total) by mouth daily. 04/27/13  Yes Webb Silversmith, NP  sertraline (ZOLOFT) 100 MG tablet TAKE 1 & 1/2 TABLETS BY MOUTH DAILY 11/03/12  Yes Biagio Borg, MD  testosterone cypionate (DEPOTESTOTERONE CYPIONATE) 200 MG/ML injection Inject 1 mL (200 mg total) into the muscle every 14 (fourteen) days. 1 cc biweekly IM (self Injected)  Last injection was on 04/12/11 01/27/13  Yes Biagio Borg, MD  tiZANidine (ZANAFLEX) 4 MG tablet Take 4 mg by mouth 2 (two) times daily as needed for muscle spasms.   Yes Historical Provider, MD  traMADol (ULTRAM) 50 MG tablet Take 50 mg by mouth. Taking 2 tabs every four hours   Yes Historical Provider, MD  buprenorphine-naloxone (SUBOXONE) 8-2 MG SUBL SL tablet Place 1 tablet under the tongue 3 (three) times daily.    Historical Provider, MD  meloxicam (MOBIC) 15 MG tablet Take 15 mg by mouth daily.    Historical Provider, MD    Allergies  Allergen Reactions  . Ambien [Zolpidem Tartrate] Other (See Comments)  Memory dysfunction  . Bupropion Hcl     REACTION: nightmares    His family history includes Alcohol abuse in his father, maternal aunt, maternal uncle, and other; Cancer in his father, maternal grandmother, and paternal uncle. There is no history of Anesthesia problems, Hypotension, Malignant hyperthermia, or Pseudochol deficiency.  He  reports that he has never smoked. He has never used smokeless tobacco. He reports that he drinks alcohol. He reports that he does not use illicit  drugs.  Review of Systems  Constitutional: Negative for fever and unexpected weight change.  HENT: Negative for congestion, dental problem, ear pain, nosebleeds, postnasal drip, rhinorrhea, sinus pressure, sneezing, sore throat and trouble swallowing.   Eyes: Negative for redness and itching.  Respiratory: Negative for cough, chest tightness, shortness of breath and wheezing.   Cardiovascular: Negative for palpitations and leg swelling.  Gastrointestinal: Negative for nausea and vomiting.  Genitourinary: Negative for dysuria.  Musculoskeletal: Negative for joint swelling.  Skin: Negative for rash.  Neurological: Negative for headaches.  Hematological: Does not bruise/bleed easily.  Psychiatric/Behavioral: Negative for dysphoric mood. The patient is not nervous/anxious.    Physical Exam:  General - No distress ENT - No sinus tenderness, no oral exudate, no LAN, no thyromegaly, TM clear, pupils equal/reactive, MP 4, enlarged tongue Cardiac - s1s2 regular, no murmur, pulses symmetric Chest - No wheeze/rales/dullness, good air entry, normal respiratory excursion Back - No focal tenderness Abd - Soft, non-tender, no organomegaly, + bowel sounds Ext - No edema Neuro - Normal strength, cranial nerves intact Skin - No rashes Psych - Normal mood, and behavior  Assessment/plan:  Derrick Kelley, M.D. Pager [phone]

## 2013-05-07 NOTE — Assessment & Plan Note (Signed)
He has history of polycythemia.  This could be related to hypoxia from sleep apnea.  Will need to further assess after review of his sleep study.

## 2013-05-11 ENCOUNTER — Encounter (HOSPITAL_COMMUNITY): Payer: Self-pay | Admitting: Emergency Medicine

## 2013-05-11 ENCOUNTER — Emergency Department (HOSPITAL_COMMUNITY)
Admission: EM | Admit: 2013-05-11 | Discharge: 2013-05-11 | Disposition: A | Discharge status: Home / Self Care | Payer: 59 | Attending: Emergency Medicine | Admitting: Emergency Medicine

## 2013-05-11 ENCOUNTER — Ambulatory Visit: Payer: 59 | Admitting: Internal Medicine

## 2013-05-11 DIAGNOSIS — M129 Arthropathy, unspecified: Secondary | ICD-10-CM | POA: Insufficient documentation

## 2013-05-11 DIAGNOSIS — G8929 Other chronic pain: Secondary | ICD-10-CM | POA: Insufficient documentation

## 2013-05-11 DIAGNOSIS — Z8709 Personal history of other diseases of the respiratory system: Secondary | ICD-10-CM | POA: Insufficient documentation

## 2013-05-11 DIAGNOSIS — I10 Essential (primary) hypertension: Secondary | ICD-10-CM | POA: Insufficient documentation

## 2013-05-11 DIAGNOSIS — R42 Dizziness and giddiness: Secondary | ICD-10-CM | POA: Insufficient documentation

## 2013-05-11 DIAGNOSIS — F3289 Other specified depressive episodes: Secondary | ICD-10-CM | POA: Insufficient documentation

## 2013-05-11 DIAGNOSIS — Z791 Long term (current) use of non-steroidal anti-inflammatories (NSAID): Secondary | ICD-10-CM | POA: Insufficient documentation

## 2013-05-11 DIAGNOSIS — E785 Hyperlipidemia, unspecified: Secondary | ICD-10-CM | POA: Insufficient documentation

## 2013-05-11 DIAGNOSIS — Z79899 Other long term (current) drug therapy: Secondary | ICD-10-CM | POA: Insufficient documentation

## 2013-05-11 DIAGNOSIS — G4700 Insomnia, unspecified: Secondary | ICD-10-CM | POA: Insufficient documentation

## 2013-05-11 DIAGNOSIS — I951 Orthostatic hypotension: Secondary | ICD-10-CM | POA: Insufficient documentation

## 2013-05-11 DIAGNOSIS — Z8719 Personal history of other diseases of the digestive system: Secondary | ICD-10-CM | POA: Insufficient documentation

## 2013-05-11 DIAGNOSIS — F329 Major depressive disorder, single episode, unspecified: Secondary | ICD-10-CM | POA: Insufficient documentation

## 2013-05-11 DIAGNOSIS — Z862 Personal history of diseases of the blood and blood-forming organs and certain disorders involving the immune mechanism: Secondary | ICD-10-CM | POA: Insufficient documentation

## 2013-05-11 LAB — CBC WITH DIFFERENTIAL/PLATELET
Basophils Absolute: 0 10*3/uL (ref 0.0–0.1)
Basophils Relative: 1 % (ref 0–1)
Eosinophils Absolute: 0.1 10*3/uL (ref 0.0–0.7)
Eosinophils Relative: 1 % (ref 0–5)
HEMATOCRIT: 38.4 % — AB (ref 39.0–52.0)
Hemoglobin: 13.8 g/dL (ref 13.0–17.0)
LYMPHS ABS: 1 10*3/uL (ref 0.7–4.0)
Lymphocytes Relative: 18 % (ref 12–46)
MCH: 33.7 pg (ref 26.0–34.0)
MCHC: 35.9 g/dL (ref 30.0–36.0)
MCV: 93.9 fL (ref 78.0–100.0)
Monocytes Absolute: 0.6 10*3/uL (ref 0.1–1.0)
Monocytes Relative: 11 % (ref 3–12)
Neutro Abs: 4 10*3/uL (ref 1.7–7.7)
Neutrophils Relative %: 69 % (ref 43–77)
PLATELETS: 126 10*3/uL — AB (ref 150–400)
RBC: 4.09 MIL/uL — ABNORMAL LOW (ref 4.22–5.81)
RDW: 13 % (ref 11.5–15.5)
WBC: 5.7 10*3/uL (ref 4.0–10.5)

## 2013-05-11 LAB — BASIC METABOLIC PANEL
BUN: 14 mg/dL (ref 6–23)
CALCIUM: 8 mg/dL — AB (ref 8.4–10.5)
CO2: 27 mEq/L (ref 19–32)
Chloride: 104 mEq/L (ref 96–112)
Creatinine, Ser: 1.38 mg/dL — ABNORMAL HIGH (ref 0.50–1.35)
GFR calc Af Amer: 66 mL/min — ABNORMAL LOW (ref >90)
GFR calc non Af Amer: 57 mL/min — ABNORMAL LOW (ref >90)
Glucose, Bld: 85 mg/dL (ref 70–99)
Potassium: 4 mEq/L (ref 3.7–5.3)
SODIUM: 138 meq/L (ref 137–147)

## 2013-05-11 LAB — I-STAT CG4 LACTIC ACID, ED: LACTIC ACID, VENOUS: 0.58 mmol/L (ref 0.5–2.2)

## 2013-05-11 LAB — TROPONIN I: Troponin I: <0.3 ng/mL (ref <0.30)

## 2013-05-11 MED ORDER — SODIUM CHLORIDE 0.9 % IV SOLN
INTRAVENOUS | Status: DC
  Administered 2013-05-11: 14:00:00 via INTRAVENOUS

## 2013-05-11 MED ORDER — SODIUM CHLORIDE 0.9 % IV SOLN
Freq: Once | INTRAVENOUS | Status: AC
  Administered 2013-05-11: 13:00:00 via INTRAVENOUS

## 2013-05-11 NOTE — ED Notes (Signed)
Pt undressed, in gown, on monitor, continuous pulse oximetry and blood pressure cuff 

## 2013-05-11 NOTE — ED Provider Notes (Signed)
CSN: [id number]     Arrival date & time 05/11/13  1230 History   First MD Initiated Contact with Patient 05/11/13 1237     Chief Complaint  Patient presents with  . Hypotension     (Consider location/radiation/quality/duration/timing/severity/associated sxs/prior Treatment) HPI Pt with PMH chronic back pain managed on Tramadol and Hypertension. He was previously on Avapro, had HCTZ added in February. He was seen in PCP office about 2 weeks ago for episodes of lightheadedness and dizziness with falls that had been happening periodically and associated with low BP readings at home. It is unclear exactly how long these episodes have been going on total but PCP advised him to take half his dose of Avapro. Patient understood that he was supposed to continue HCTZ although PCP note indicates he was supposed to stop it. He reports feeling lightheaded and dizzy this morning which improved with rest, but then felt lightheaded while sitting on the couch. States he has been taking his BP meds at night to help with daytime symptoms, but then also tells me he has been taking tamulosin in the mornings. He did NOT have that medication listed when he went to PCP office 2 weeks ago because 'it was prescribed by a different doctor'. EMS was called to the house and found him to be hypotensive. He was given a liter of saline at home and then transported to the ED with continued orthostasis. He has had a total of 2000 cc saline and remains hypotensive in the ED. Denies CP, SOB, vomiting. Has had good appetite recently.   Past Medical History  Diagnosis Date  . BACK PAIN, CHRONIC 05/11/2009    bone on bone;hx of herniated disc  . Flushing 05/11/2009  . HYPERLIPIDEMIA     takes Lipitor daily  . HYPOGONADISM 05/11/2009    takes Testosterone every 14days  . INSOMNIA-SLEEP DISORDER-UNSPEC 10/10/2009    takes Ambien nightly  . POLYCYTHEMIA 05/11/2009  . SCIATICA, RIGHT 09/20/2009  . SINUSITIS- ACUTE-NOS 10/10/2009  . URI  05/11/2009  . Arthritis   . Chronic back pain     hx buldging disc;  . Hemorrhoids     hx of  . HYPERTENSION     takes Avapro,Amlodipine,and Lisinopril daily  . Headache(784.0) 10/10/2009    cluster;last one about a yr ago  . Joint pain   . Joint pain   . GERD 05/11/2009    but doesn't require meds  . DEPRESSION     takes Zoloft daily   Past Surgical History  Procedure Laterality Date  . Left clavicle fracture    . Hx eye surgury 2001  many yrs ago    pterygium-both eyes  . Hx back surgury lumbar fusion  2007  . Hx left shoulder surgury  2002 and August 10, 2009    Dr. Tamera Punt, ortho  . Mouth surgery      wisdom teeth extracted  . Back surgery  2013  . Left shoulder surgery    . Anterior lumbar fusion  01/02/2012    Procedure: ANTERIOR LUMBAR FUSION 1 LEVEL;  Surgeon: Sinclair Ship, MD;  Location: [facility];  Service: Orthopedics;  Laterality: Bilateral;  Anterior lumbar interbody fusion, lumbar 4-5 with OP-1 and instrumentation.  . Abdominal exposure  01/02/2012    Procedure: ABDOMINAL EXPOSURE;  Surgeon: Angelia Mould, MD;  Location: [facility];  Service: Vascular;  Laterality: Bilateral;   Family History  Problem Relation Age of Onset  . Alcohol abuse Father   . Cancer Father  prostate  . Alcohol abuse Maternal Aunt   . Alcohol abuse Maternal Uncle   . Cancer Paternal Uncle     prostate  . Cancer Maternal Grandmother     ovarian cancer  . Alcohol abuse Other   . Anesthesia problems Neg Hx   . Hypotension Neg Hx   . Malignant hyperthermia Neg Hx   . Pseudochol deficiency Neg Hx    History  Substance Use Topics  . Smoking status: Never Smoker   . Smokeless tobacco: Never Used  . Alcohol Use: Yes     Comment: occasional    Review of Systems All other systems reviewed and are negative except as noted in HPI.     Allergies  Ambien; Bupropion hcl; and Codeine  Home Medications   Current Outpatient Rx  Name  Route  Sig  Dispense  Refill  .  atorvastatin (LIPITOR) 10 MG tablet   Oral   Take 10 mg by mouth daily.         Marland Kitchen gabapentin (NEURONTIN) 600 MG tablet   Oral   Take 2,400 mg by mouth at bedtime.         . hydrochlorothiazide (MICROZIDE) 12.5 MG capsule   Oral   Take 12.5 mg by mouth daily.         . irbesartan (AVAPRO) 150 MG tablet   Oral   Take 75 mg by mouth daily.         . meloxicam (MOBIC) 15 MG tablet   Oral   Take 15 mg by mouth daily.         . sertraline (ZOLOFT) 100 MG tablet   Oral   Take 150 mg by mouth daily.         . tamsulosin (FLOMAX) 0.4 MG CAPS capsule   Oral   Take 0.4 mg by mouth daily after breakfast.         . testosterone cypionate (DEPOTESTOTERONE CYPIONATE) 200 MG/ML injection   Intramuscular   Inject 1 mL (200 mg total) into the muscle every 14 (fourteen) days. 1 cc biweekly IM (self Injected)  Last injection was on 04/12/11   10 mL   5   . tiZANidine (ZANAFLEX) 4 MG tablet   Oral   Take 4 mg by mouth every 8 (eight) hours as needed for muscle spasms.          . traMADol (ULTRAM) 50 MG tablet   Oral   Take 100 mg by mouth 4 (four) times daily as needed for moderate pain. Taking 2 tabs every four hours          BP 82/59  Pulse 60  Temp(Src) 97.6 F (36.4 C) (Oral)  Resp 18  SpO2 100% Physical Exam  Nursing note and vitals reviewed. Constitutional: He is oriented to person, place, and time. He appears well-developed and well-nourished.  HENT:  Head: Normocephalic and atraumatic.  Eyes: EOM are normal. Pupils are equal, round, and reactive to light.  Neck: Normal range of motion. Neck supple.  Cardiovascular: Normal rate, normal heart sounds and intact distal pulses.   Pulmonary/Chest: Effort normal and breath sounds normal.  Abdominal: Bowel sounds are normal. He exhibits no distension. There is no tenderness.  Musculoskeletal: Normal range of motion. He exhibits no edema and no tenderness.  Neurological: He is alert and oriented to person,  place, and time. He has normal strength. No cranial nerve deficit or sensory deficit.  Skin: Skin is warm and dry. No rash noted.  Psychiatric:  He has a normal mood and affect.    ED Course  Procedures (including critical care time) Labs Review Labs Reviewed  CBC WITH DIFFERENTIAL - Abnormal; Notable for the following:    RBC 4.09 (*)    HCT 38.4 (*)    Platelets 126 (*)    All other components within normal limits  BASIC METABOLIC PANEL - Abnormal; Notable for the following:    Creatinine, Ser 1.38 (*)    Calcium 8.0 (*)    GFR calc non Af Amer 57 (*)    GFR calc Af Amer 66 (*)    All other components within normal limits  TROPONIN I  I-STAT CG4 LACTIC ACID, ED   Imaging Review No results found.   EKG Interpretation   Date/Time:  Tuesday May 11 2013 15:22:28 EDT Ventricular Rate:  71 PR Interval:  156 QRS Duration: 89 QT Interval:  554 QTC Calculation: 602 R Axis:   -25 Text Interpretation:  Age not entered, assumed to be  53 years old for  purpose of ECG interpretation Sinus rhythm Borderline left axis deviation  Borderline T abnormalities, diffuse leads Prolonged QT interval No  significant change since last tracing Confirmed by [facility]  MD, Juanda Crumble  [phone]) on 05/11/2013 3:28:39 PM      MDM   Final diagnoses:  Orthostatic hypotension    Labs unremarkable. Pts BP improved considerably with IVF and time. Suspect that Flomax is the culprit med. Advised to stop Flomax. Close followup with PCP for further eval.     Juanda Crumble B. Karle Starch, MD 05/11/13 1530

## 2013-05-11 NOTE — ED Notes (Signed)
Patient is seeing Dr. Cecille Po for this, his bp medications are being changed to help.  Pt took medicines last night, none this morning.  Patient had presyncopal episode this morning, his wife checked bp it was 60/40.  EMs reports orthostatic vitals lying 100/70, sitting 87/60 after one liter of IV fluid.  With HR in 50s.  PIV initiated by EMS.  Patient has no complaints while not moving.  He reports no strength when he is standing.

## 2013-05-11 NOTE — ED Notes (Signed)
Education given to patient reguarding using call bell to ask for assistance and notify staff if he needs to use restroom or wants to get up.

## 2013-05-11 NOTE — ED Notes (Signed)
Results of lactic acid given to Dr. Sheldon 

## 2013-05-11 NOTE — ED Notes (Signed)
Discharge instructions reviewed with patient and family.  Patient able to verbalize understanding.

## 2013-05-11 NOTE — Discharge Instructions (Signed)
Orthostatic Hypotension °Orthostatic hypotension is a sudden fall in blood pressure. It occurs when a person goes from a sitting or lying position to a standing position. °CAUSES  °· Loss of body fluids (dehydration). °· Medicines that lower blood pressure. °· Sudden changes in posture, such as sudden standing when you have been sitting or lying down. °· Taking too much of your medicine. °SYMPTOMS  °· Lightheadedness or dizziness. °· Fainting or near-fainting. °· A fast heart rate (tachycardia). °· Weakness. °· Feeling tired (fatigue). °DIAGNOSIS  °Your caregiver may find the cause of orthostatic hypotension through: °· A history and/or physical exam. °· Checking your blood pressure. Your caregiver will check your blood pressure when you are: °· Lying down. °· Sitting. °· Standing. °· Tilt table testing. In this test, you are placed on a table that goes from a lying position to a standing position. You will be strapped to the table. This test helps to monitor your blood pressure and heart rate when you are in different positions. °TREATMENT  °· If orthostatic hypotension is caused by your medicines, your caregiver will need to adjust your dosage. Do not stop or adjust your medicine on your own. °· When changing positions, make these changes slowly. This allows your body to adjust to the different position. °· Compression stockings that are worn on your lower legs may be helpful. °· Your caregiver may have you consume extra salt. Do not add extra salt to your diet unless directed by your caregiver. °· Eat frequent, small meals. Avoid sudden standing after eating. °· Avoid hot showers or excessive heat. °· Your caregiver may give you fluids through the vein (intravenous). °· Your caregiver may put you on medicine to help enhance fluid retention. °SEEK IMMEDIATE MEDICAL CARE IF:  °· You faint or have a near-fainting episode. Call your local emergency services (911 in [place] °· You have or develop chest pain. °· You  feel sick to your stomach (nauseous) or vomit. °· You have a loss of feeling or movement in your arms or legs. °· You have difficulty talking, slurred speech, or you are unable to talk. °· You have difficulty thinking or have confused thinking. °MAKE SURE YOU:  °· Understand these instructions. °· Will watch your condition. °· Will get help right away if you are not doing well or get worse. °Document Released: 01/18/2002 Document Revised: 04/22/2011 Document Reviewed: 05/13/2008 °Exit[employer]® Patient Information ©2014 [employer]. ° °

## 2013-05-13 ENCOUNTER — Telehealth: Payer: Self-pay | Admitting: Internal Medicine

## 2013-05-13 NOTE — Telephone Encounter (Signed)
Rec' d from [place] forward 2 pages to Jenny Reichmann

## 2013-05-20 ENCOUNTER — Ambulatory Visit (INDEPENDENT_AMBULATORY_CARE_PROVIDER_SITE_OTHER): Payer: Managed Care, Other (non HMO) | Admitting: Internal Medicine

## 2013-05-20 ENCOUNTER — Encounter: Payer: Self-pay | Admitting: Internal Medicine

## 2013-05-20 VITALS — BP 138/86 | HR 74 | Temp 98.3°F | Wt 189.8 lb

## 2013-05-20 DIAGNOSIS — E785 Hyperlipidemia, unspecified: Secondary | ICD-10-CM

## 2013-05-20 DIAGNOSIS — I10 Essential (primary) hypertension: Secondary | ICD-10-CM

## 2013-05-20 MED ORDER — ATORVASTATIN CALCIUM 20 MG PO TABS: 20.0000 mg | ORAL_TABLET | Freq: Every day | ORAL | Status: DC

## 2013-05-20 NOTE — Assessment & Plan Note (Signed)
Elevated on Lipitor 10 mg daily Will increase to 20 mg daily Fish Oil OTC 1000 mg TID  Work on diet and exercise  RTC in 3 months to recheck lipids

## 2013-05-20 NOTE — Progress Notes (Signed)
Subjective:    Patient ID: Derrick Kelley, male    DOB: 1960-08-16, 53 y.o.   MRN: [id number]  HPI  Pt presents to the clinic today for hospital followup of syncopal episode. This occurred 05/11/13. He went to the ER after a syncopal episode. In the ER, he was noted to be hypotensive. He did have improvement with a couple of liters of fluid. ECG was normal. It was felt that this was due to his BP meds and the flomax. 1 week prior to this incidence, he had been feeling dizzy and lightheaded. He presented to the clinic. His BP was normal. He was taking Avapro, HCTZ but did not mention he was on Flomax. He was supposed to stop the HCTZ and cut his Avapro pills in half. He somehow got confused and was still taking his HCTZ. He did report another syncopal episode yesterday. He did not lose consciousness. He denies chest pain, chest tightness or shortness of breath.  Review of Systems   Past Medical History  Diagnosis Date  . BACK PAIN, CHRONIC 05/11/2009    bone on bone;hx of herniated disc  . Flushing 05/11/2009  . HYPERLIPIDEMIA     takes Lipitor daily  . HYPOGONADISM 05/11/2009    takes Testosterone every 14days  . INSOMNIA-SLEEP DISORDER-UNSPEC 10/10/2009    takes Ambien nightly  . POLYCYTHEMIA 05/11/2009  . SCIATICA, RIGHT 09/20/2009  . SINUSITIS- ACUTE-NOS 10/10/2009  . URI 05/11/2009  . Arthritis   . Chronic back pain     hx buldging disc;  . Hemorrhoids     hx of  . HYPERTENSION     takes Avapro,Amlodipine,and Lisinopril daily  . Headache(784.0) 10/10/2009    cluster;last one about a yr ago  . Joint pain   . Joint pain   . GERD 05/11/2009    but doesn't require meds  . DEPRESSION     takes Zoloft daily    Current Outpatient Prescriptions  Medication Sig Dispense Refill  . atorvastatin (LIPITOR) 10 MG tablet Take 10 mg by mouth daily.      Marland Kitchen gabapentin (NEURONTIN) 600 MG tablet Take 2,400 mg by mouth at bedtime.      . irbesartan (AVAPRO) 150 MG tablet Take 75 mg by mouth  daily.      . meloxicam (MOBIC) 15 MG tablet Take 15 mg by mouth daily.      . sertraline (ZOLOFT) 100 MG tablet Take 150 mg by mouth daily.      Marland Kitchen testosterone cypionate (DEPOTESTOTERONE CYPIONATE) 200 MG/ML injection Inject 1 mL (200 mg total) into the muscle every 14 (fourteen) days. 1 cc biweekly IM (self Injected)  Last injection was on 04/12/11  10 mL  5  . tiZANidine (ZANAFLEX) 4 MG tablet Take 4 mg by mouth every 8 (eight) hours as needed for muscle spasms.       . traMADol (ULTRAM) 50 MG tablet Take 100 mg by mouth 4 (four) times daily as needed for moderate pain. Taking 2 tabs every four hours       No current facility-administered medications for this visit.    Allergies  Allergen Reactions  . Ambien [Zolpidem Tartrate] Other (See Comments)    Memory dysfunction  . Bupropion Hcl     REACTION: nightmares  . Codeine Hives    Family History  Problem Relation Age of Onset  . Alcohol abuse Father   . Cancer Father     prostate  . Alcohol abuse Maternal Aunt   .  Alcohol abuse Maternal Uncle   . Cancer Paternal Uncle     prostate  . Cancer Maternal Grandmother     ovarian cancer  . Alcohol abuse Other   . Anesthesia problems Neg Hx   . Hypotension Neg Hx   . Malignant hyperthermia Neg Hx   . Pseudochol deficiency Neg Hx     History   Social History  . Marital Status: Married    Spouse Name: N/A    Number of Children: 2  . Years of Education: N/A   Occupational History  . unemployed    Social History Main Topics  . Smoking status: Never Smoker   . Smokeless tobacco: Never Used  . Alcohol Use: Yes     Comment: occasional  . Drug Use: No     Comment: former cocaine, none for years  . Sexual Activity: Yes   Other Topics Concern  . Not on file   Social History Narrative  . No narrative on file     Constitutional: Denies fever, malaise, fatigue, headache or abrupt weight changes.  Respiratory: Denies difficulty breathing, shortness of breath, cough or  sputum production.   Cardiovascular: Denies chest pain, chest tightness, palpitations or swelling in the hands or feet.  Neurological: Denies dizziness, difficulty with memory, difficulty with speech or problems with balance and coordination.   No other specific complaints in a complete review of systems (except as listed in HPI above).   Objective:   Physical Exam    BP 138/86  Pulse 74  Temp(Src) 98.3 F (36.8 C) (Oral)  Wt 189 lb 12 oz (86.07 kg)  SpO2 96% Wt Readings from Last 3 Encounters:  05/20/13 189 lb 12 oz (86.07 kg)  05/07/13 192 lb 3.2 oz (87.181 kg)  04/27/13 190 lb (86.183 kg)    General: Appears his stated age, well developed, well nourished in NAD. HEENT: Head: normal shape and size; Eyes: sclera white, no icterus, conjunctiva pink, PERRLA and EOMs intact; Ears: Tm's gray and intact, normal light reflex; Nose: mucosa pink and moist, septum midline; Throat/Mouth: Teeth present, mucosa pink and moist, no exudate, lesions or ulcerations noted.  Cardiovascular: Normal rate and rhythm. S1,S2 noted.  No murmur, rubs or gallops noted. No JVD or BLE edema. No carotid bruits noted. Pulmonary/Chest: Normal effort and positive vesicular breath sounds. No respiratory distress. No wheezes, rales or ronchi noted.  Neurological: Alert and oriented. Cranial nerves II-XII intact. Coordination normal.    BMET    Component Value Date/Time   NA 138 05/11/2013 1346   K 4.0 05/11/2013 1346   CL 104 05/11/2013 1346   CO2 27 05/11/2013 1346   GLUCOSE 85 05/11/2013 1346   BUN 14 05/11/2013 1346   CREATININE 1.38* 05/11/2013 1346   CALCIUM 8.0* 05/11/2013 1346   GFRNONAA 57* 05/11/2013 1346   GFRAA 66* 05/11/2013 1346    Lipid Panel     Component Value Date/Time   CHOL 222* 03/17/2013 1442   TRIG 424.0* 03/17/2013 1442   HDL 33.30* 03/17/2013 1442   CHOLHDL 7 03/17/2013 1442   VLDL 84.8* 03/17/2013 1442   LDLCALC 82 11/06/2011 1301    CBC    Component Value Date/Time   WBC 5.7 05/11/2013  1346   WBC 7.4 01/02/2010 1416   RBC 4.09* 05/11/2013 1346   RBC 4.86 01/02/2010 1416   HGB 13.8 05/11/2013 1346   HGB Unable to Determine 01/02/2010 1416   HCT 38.4* 05/11/2013 1346   HCT 41.2 01/02/2010 1416  PLT 126* 05/11/2013 1346   PLT 208 01/02/2010 1416   MCV 93.9 05/11/2013 1346   MCV 84.8 01/02/2010 1416   MCH 33.7 05/11/2013 1346   MCH Unable to determine 01/02/2010 1416   MCHC 35.9 05/11/2013 1346   MCHC Unable to determine 01/02/2010 1416   RDW 13.0 05/11/2013 1346   RDW 12.9 01/02/2010 1416   LYMPHSABS 1.0 05/11/2013 1346   LYMPHSABS 1.6 01/02/2010 1416   MONOABS 0.6 05/11/2013 1346   MONOABS 0.4 01/02/2010 1416   EOSABS 0.1 05/11/2013 1346   EOSABS 0.1 01/02/2010 1416   BASOSABS 0.0 05/11/2013 1346   BASOSABS 0.0 01/02/2010 1416    Hgb A1C No results found for this basename: HGBA1C       Assessment & Plan:

## 2013-05-20 NOTE — Assessment & Plan Note (Signed)
This is the plan:  D/c HCTZ: throw this medication away Avapro, take 75 mg daily (1/2 of a tablet) D/c flomax: throw this medication away  If you continue to have syncopal episodes, will refer to cardiology for further evaluation

## 2013-05-20 NOTE — Patient Instructions (Signed)
Syncope  Syncope is a fainting spell. This means the person loses consciousness and drops to the ground. The person is generally unconscious for less than 5 minutes. The person may have some muscle twitches for up to 15 seconds before waking up and returning to normal. Syncope occurs more often in elderly people, but it can happen to anyone. While most causes of syncope are not dangerous, syncope can be a sign of a serious medical problem. It is important to seek medical care.   CAUSES   Syncope is caused by a sudden decrease in blood flow to the brain. The specific cause is often not determined. Factors that can trigger syncope include:   Taking medicines that lower blood pressure.   Sudden changes in posture, such as standing up suddenly.   Taking more medicine than prescribed.   Standing in one place for too long.   Seizure disorders.   Dehydration and excessive exposure to heat.   Low blood sugar (hypoglycemia).   Straining to have a bowel movement.   Heart disease, irregular heartbeat, or other circulatory problems.   Fear, emotional distress, seeing blood, or severe pain.  SYMPTOMS   Right before fainting, you may:   Feel dizzy or lightheaded.   Feel nauseous.   See all white or all black in your field of vision.   Have cold, clammy skin.  DIAGNOSIS   Your caregiver will ask about your symptoms, perform a physical exam, and perform electrocardiography (ECG) to record the electrical activity of your heart. Your caregiver may also perform other heart or blood tests to determine the cause of your syncope.  TREATMENT   In most cases, no treatment is needed. Depending on the cause of your syncope, your caregiver may recommend changing or stopping some of your medicines.  HOME CARE INSTRUCTIONS   Have someone stay with you until you feel stable.   Do not drive, operate machinery, or play sports until your caregiver says it is okay.   Keep all follow-up appointments as directed by your  caregiver.   Lie down right away if you start feeling like you might faint. Breathe deeply and steadily. Wait until all the symptoms have passed.   Drink enough fluids to keep your urine clear or pale yellow.   If you are taking blood pressure or heart medicine, get up slowly, taking several minutes to sit and then stand. This can reduce dizziness.  SEEK IMMEDIATE MEDICAL CARE IF:    You have a severe headache.   You have unusual pain in the chest, abdomen, or back.   You are bleeding from the mouth or rectum, or you have black or tarry stool.   You have an irregular or very fast heartbeat.   You have pain with breathing.   You have repeated fainting or seizure-like jerking during an episode.   You faint when sitting or lying down.   You have confusion.   You have difficulty walking.   You have severe weakness.   You have vision problems.  If you fainted, call your local emergency services (911 in [place] Do not drive yourself to the hospital.   MAKE SURE YOU:   Understand these instructions.   Will watch your condition.   Will get help right away if you are not doing well or get worse.  Document Released: 01/28/2005 Document Revised: 07/30/2011 Document Reviewed: 03/29/2011  Exit[employer] Patient Information 2014 [employer].

## 2013-05-20 NOTE — Progress Notes (Signed)
Pre visit review using our clinic review tool, if applicable. No additional management support is needed unless otherwise documented below in the visit note. 

## 2013-05-26 ENCOUNTER — Telehealth: Payer: Self-pay | Admitting: Internal Medicine

## 2013-05-26 NOTE — Telephone Encounter (Signed)
Received 2 pages from [place], sent to Dr. Jenny Reichmann on 05/26/13/ss.

## 2013-06-10 ENCOUNTER — Encounter: Payer: Self-pay | Admitting: Internal Medicine

## 2013-06-10 ENCOUNTER — Ambulatory Visit (INDEPENDENT_AMBULATORY_CARE_PROVIDER_SITE_OTHER): Payer: Managed Care, Other (non HMO) | Admitting: Internal Medicine

## 2013-06-10 ENCOUNTER — Telehealth: Payer: Self-pay | Admitting: Internal Medicine

## 2013-06-10 VITALS — BP 164/92 | HR 71 | Temp 97.8°F | Wt 193.0 lb

## 2013-06-10 DIAGNOSIS — I10 Essential (primary) hypertension: Secondary | ICD-10-CM

## 2013-06-10 DIAGNOSIS — R259 Unspecified abnormal involuntary movements: Secondary | ICD-10-CM

## 2013-06-10 DIAGNOSIS — R251 Tremor, unspecified: Secondary | ICD-10-CM

## 2013-06-10 MED ORDER — AMLODIPINE BESYLATE 10 MG PO TABS: 10.0000 mg | ORAL_TABLET | Freq: Every day | ORAL | Status: DC

## 2013-06-10 NOTE — Assessment & Plan Note (Signed)
Labile Has recently had multiple medication adjustments Will increase avapro to 150 mg daily  May benefit from referral to cardiology

## 2013-06-10 NOTE — Patient Instructions (Addendum)

## 2013-06-10 NOTE — Telephone Encounter (Signed)
Relevant patient education assigned to patient using Emmi. ° °

## 2013-06-10 NOTE — Progress Notes (Signed)
Subjective:    Patient ID: Derrick Kelley, male    DOB: [birth year], 53 y.o.   MRN: [id number]  HPI  Pt presents to the clinic today with c/o elevated blood pressure. He reports his BP has been as high as 195/116. He reports that he has felt shaky at times but denies chest pain, chest tightness or shortness of breath. He is currently on 75 mg Avapro. He was on Flomax, Avapro 150 mg and HCTZ, when he started having syncopal episodes. Flomax was originally d/c'd. He continued to have syncopal episodes so HCTZ was d/c'd and avapro was cut in half. He reports now he is experiencing elevations in his blood pressure. He has not experienced any syncopal episodes, has had some intermittent dizziness, twitching, and constant tremor.   The dizziness occurred yesterday and felt like the room was spinning. It was relieved by rest and got better after eating lunch. He has not had any other spells like this recently.   The twitching and tremor have been noticeable to the pt for years but have just gotten worse in the last couple of months. Pt states that he notices the twitching when he goes to do things that require more fine movement such as texting or typing but doesn't notice it as much when he goes to do things like picking up a book etc. The twitching will also occur intermittantly as he is just resting. He also stated that he has a light resting tremor different than the twitching that occurs at all times. This twitching and tremor are most noticeable in his hands but also occur in legs and feet as well.   Review of Systems      Past Medical History  Diagnosis Date  . BACK PAIN, CHRONIC 05/11/2009    bone on bone;hx of herniated disc  . Flushing 05/11/2009  . HYPERLIPIDEMIA     takes Lipitor daily  . HYPOGONADISM 05/11/2009    takes Testosterone every 14days  . INSOMNIA-SLEEP DISORDER-UNSPEC 10/10/2009    takes Ambien nightly  . POLYCYTHEMIA 05/11/2009  . SCIATICA, RIGHT 09/20/2009  . SINUSITIS-  ACUTE-NOS 10/10/2009  . URI 05/11/2009  . Arthritis   . Chronic back pain     hx buldging disc;  . Hemorrhoids     hx of  . HYPERTENSION     takes Avapro,Amlodipine,and Lisinopril daily  . Headache(784.0) 10/10/2009    cluster;last one about a yr ago  . Joint pain   . Joint pain   . GERD 05/11/2009    but doesn't require meds  . DEPRESSION     takes Zoloft daily    Current Outpatient Prescriptions  Medication Sig Dispense Refill  . atorvastatin (LIPITOR) 20 MG tablet Take 1 tablet (20 mg total) by mouth daily.  90 tablet  0  . gabapentin (NEURONTIN) 600 MG tablet Take 2,400 mg by mouth at bedtime.      . irbesartan (AVAPRO) 150 MG tablet Take 75 mg by mouth daily.      . meloxicam (MOBIC) 15 MG tablet Take 15 mg by mouth daily.      . sertraline (ZOLOFT) 100 MG tablet Take 150 mg by mouth daily.      Marland Kitchen testosterone cypionate (DEPOTESTOTERONE CYPIONATE) 200 MG/ML injection Inject 1 mL (200 mg total) into the muscle every 14 (fourteen) days. 1 cc biweekly IM (self Injected)  Last injection was on 04/12/11  10 mL  5  . tiZANidine (ZANAFLEX) 4 MG tablet Take 4 mg by  mouth every 8 (eight) hours as needed for muscle spasms.       . traMADol (ULTRAM) 50 MG tablet Take 100 mg by mouth 4 (four) times daily as needed for moderate pain. Taking 2 tabs every four hours       No current facility-administered medications for this visit.    Allergies  Allergen Reactions  . Ambien [Zolpidem Tartrate] Other (See Comments)    Memory dysfunction  . Bupropion Hcl     REACTION: nightmares  . Codeine Hives    Family History  Problem Relation Age of Onset  . Alcohol abuse Father   . Cancer Father     prostate  . Alcohol abuse Maternal Aunt   . Alcohol abuse Maternal Uncle   . Cancer Paternal Uncle     prostate  . Cancer Maternal Grandmother     ovarian cancer  . Alcohol abuse Other   . Anesthesia problems Neg Hx   . Hypotension Neg Hx   . Malignant hyperthermia Neg Hx   . Pseudochol  deficiency Neg Hx     History   Social History  . Marital Status: Married    Spouse Name: N/A    Number of Children: 2  . Years of Education: N/A   Occupational History  . unemployed    Social History Main Topics  . Smoking status: Never Smoker   . Smokeless tobacco: Never Used  . Alcohol Use: Yes     Comment: occasional  . Drug Use: No     Comment: former cocaine, none for years  . Sexual Activity: Yes   Other Topics Concern  . Not on file   Social History Narrative  . No narrative on file   Constitutional: Denies fever, malaise, fatigue, headache or abrupt weight changes.  Respiratory: Denies difficulty breathing, shortness of breath, cough or sputum production.   Cardiovascular: Denies chest pain, chest tightness, palpitations or swelling in the hands or feet.   Neurological: Pt admits to a tremor and intermittent twitching of b/l hands and has had one recent episode of dizziness. Denies difficulty with memory, difficulty with speech or problems with balance and coordination.   No other specific complaints in a complete review of systems (except as listed in HPI above).  Objective:   Physical Exam  BP 164/92  Pulse 71  Temp(Src) 97.8 F (36.6 C) (Oral)  Wt 193 lb (87.544 kg) Wt Readings from Last 3 Encounters:  06/10/13 193 lb (87.544 kg)  05/20/13 189 lb 12 oz (86.07 kg)  05/07/13 192 lb 3.2 oz (87.181 kg)   General: Appears his stated age, well developed, well nourished in NAD. Cardiovascular: Normal rate and rhythm. S1,S2 noted.  No murmur, rubs or gallops noted. No JVD or BLE edema. No carotid bruits noted. Pulmonary/Chest: Normal effort and positive vesicular breath sounds. No respiratory distress. No wheezes, rales or ronchi noted.   Musculoskeletal: Normal ROM and strength of b/l wrists and hands. Resting fine tremor of the b/l hands noted. Intermittent twitch of b/l hands noted during exams. Negative asterixis.   Neurological: Alert and oriented.  Coordination normal.   BMET    Component Value Date/Time   NA 138 05/11/2013 1346   K 4.0 05/11/2013 1346   CL 104 05/11/2013 1346   CO2 27 05/11/2013 1346   GLUCOSE 85 05/11/2013 1346   BUN 14 05/11/2013 1346   CREATININE 1.38* 05/11/2013 1346   CALCIUM 8.0* 05/11/2013 1346   GFRNONAA 57* 05/11/2013 1346   GFRAA 66*  05/11/2013 1346    Lipid Panel     Component Value Date/Time   CHOL 222* 03/17/2013 1442   TRIG 424.0* 03/17/2013 1442   HDL 33.30* 03/17/2013 1442   CHOLHDL 7 03/17/2013 1442   VLDL 84.8* 03/17/2013 1442   LDLCALC 82 11/06/2011 1301    CBC    Component Value Date/Time   WBC 5.7 05/11/2013 1346   WBC 7.4 01/02/2010 1416   RBC 4.09* 05/11/2013 1346   RBC 4.86 01/02/2010 1416   HGB 13.8 05/11/2013 1346   HGB Unable to Determine 01/02/2010 1416   HCT 38.4* 05/11/2013 1346   HCT 41.2 01/02/2010 1416   PLT 126* 05/11/2013 1346   PLT 208 01/02/2010 1416   MCV 93.9 05/11/2013 1346   MCV 84.8 01/02/2010 1416   MCH 33.7 05/11/2013 1346   MCH Unable to determine 01/02/2010 1416   MCHC 35.9 05/11/2013 1346   MCHC Unable to determine 01/02/2010 1416   RDW 13.0 05/11/2013 1346   RDW 12.9 01/02/2010 1416   LYMPHSABS 1.0 05/11/2013 1346   LYMPHSABS 1.6 01/02/2010 1416   MONOABS 0.6 05/11/2013 1346   MONOABS 0.4 01/02/2010 1416   EOSABS 0.1 05/11/2013 1346   EOSABS 0.1 01/02/2010 1416   BASOSABS 0.0 05/11/2013 1346   BASOSABS 0.0 01/02/2010 1416      Assessment & Plan:   HTN:   Continue Avapro 150 mg Q day  Rx Norvasc 10 mg Q day  Will recheck BP in 1 month  Tremor/Twitch of b/l hands:   Will continue to monitor for worsening  May need referral to Neuro that specializes in movement disorders for further eval   RTC to clinic if sxs worsen or do not approve

## 2013-06-10 NOTE — Progress Notes (Signed)
Pre visit review using our clinic review tool, if applicable. No additional management support is needed unless otherwise documented below in the visit note. 

## 2013-06-14 ENCOUNTER — Ambulatory Visit (HOSPITAL_BASED_OUTPATIENT_CLINIC_OR_DEPARTMENT_OTHER): Payer: Managed Care, Other (non HMO) | Attending: Pulmonary Disease

## 2013-06-14 VITALS — Ht 70.0 in | Wt 190.0 lb

## 2013-06-14 DIAGNOSIS — D45 Polycythemia vera: Secondary | ICD-10-CM | POA: Insufficient documentation

## 2013-06-14 DIAGNOSIS — Z79899 Other long term (current) drug therapy: Secondary | ICD-10-CM | POA: Insufficient documentation

## 2013-06-14 DIAGNOSIS — G4733 Obstructive sleep apnea (adult) (pediatric): Secondary | ICD-10-CM | POA: Insufficient documentation

## 2013-06-14 DIAGNOSIS — R0683 Snoring: Secondary | ICD-10-CM

## 2013-06-14 DIAGNOSIS — Q613 Polycystic kidney, unspecified: Secondary | ICD-10-CM | POA: Insufficient documentation

## 2013-06-15 ENCOUNTER — Telehealth: Payer: Self-pay | Admitting: Pulmonary Disease

## 2013-06-15 DIAGNOSIS — G4733 Obstructive sleep apnea (adult) (pediatric): Secondary | ICD-10-CM

## 2013-06-15 NOTE — Telephone Encounter (Signed)
lmtcb x1 

## 2013-06-15 NOTE — Telephone Encounter (Signed)
PSG 06/14/13 >> AHI 30.9, SaO2 low 77%.  Sub-optimal titration >> centrals with higher CPAP and BiPAP.    Will have my nurse schedule ROV to review results.

## 2013-06-15 NOTE — Sleep Study (Signed)
[place]  NAME: DOUG BUCKLIN DATE OF BIRTH:  1960/07/04 MEDICAL RECORD NUMBER [id number]  LOCATION: [facility]  PHYSICIAN: Chesley Mires, M.D. DATE OF STUDY: 06/14/2013  SLEEP STUDY TYPE: Split night protocol               REFERRING PHYSICIAN: Chesley Mires, MD  INDICATION FOR STUDY:  Derrick Kelley is a 53 y.o. male who presents to the sleep lab for evaluation of hypersomnia with obstructive sleep apnea.  He reports snoring, sleep disruption, apnea, and daytime sleepiness.  He also has a history of polycythemia, insomnia, and polycystic kidney disease.  EPWORTH SLEEPINESS SCORE: 6. HEIGHT: 5\' 10"  (177.8 cm)  WEIGHT: 190 lb (86.183 kg)    Body mass index is 27.26 kg/(m^2).  NECK SIZE: 17 in.  MEDICATIONS:  Current Outpatient Prescriptions on File Prior to Visit  Medication Sig Dispense Refill  . amLODipine (NORVASC) 10 MG tablet Take 1 tablet (10 mg total) by mouth daily.  30 tablet  2  . atorvastatin (LIPITOR) 20 MG tablet Take 1 tablet (20 mg total) by mouth daily.  90 tablet  0  . buprenorphine-naloxone (SUBOXONE) 8-2 MG SUBL SL tablet Place 1 tablet under the tongue daily.      Marland Kitchen gabapentin (NEURONTIN) 600 MG tablet Take 2,400 mg by mouth at bedtime.      . irbesartan (AVAPRO) 150 MG tablet Take 75 mg by mouth daily.      . meloxicam (MOBIC) 15 MG tablet Take 15 mg by mouth daily.      . sertraline (ZOLOFT) 100 MG tablet Take 150 mg by mouth daily.      Marland Kitchen testosterone cypionate (DEPOTESTOTERONE CYPIONATE) 200 MG/ML injection Inject 1 mL (200 mg total) into the muscle every 14 (fourteen) days. 1 cc biweekly IM (self Injected)  Last injection was on 04/12/11  10 mL  5  . tiZANidine (ZANAFLEX) 4 MG tablet Take 4 mg by mouth every 8 (eight) hours as needed for muscle spasms.       . traMADol (ULTRAM) 50 MG tablet Take 100 mg by mouth 4 (four) times daily as needed for moderate pain. Taking 2 tabs every four hours       No current  facility-administered medications on file prior to visit.    SLEEP ARCHITECTURE:  Diagnostic portion: Total recording time: 212.5 minutes.  Total sleep time was: 128 minutes.  Sleep efficiency: 60.2%.  Sleep latency: 70 minutes.  REM latency: N/A.  Stage N1: 5.5%.  Stage N2: 94.5%.  Stage N3: 0%.  Stage R:  0%.  Supine sleep: 98 minutes.  Non-supine sleep: 30 minutes.  Titration portion: Total recording time: 218 minutes.  Total sleep time was: 190 minutes.  Sleep efficiency: 87.2%.  Sleep latency: 1 minutes.  REM latency: 109.5 minutes.  Stage N1: 2.9%.  Stage N2: 90.3%.  Stage N3: 0%.  Stage R:  6.8%.  Supine sleep: 190 minutes.  Non-supine sleep: 177 minutes.  CARDIAC DATA:  Average heart rate: 68 beats per minute. Rhythm strip: sinus rhythm with sinus arrhythmia.  RESPIRATORY DATA: Diagnostic portion: Average respiratory rate: 16. Snoring: moderate. Average AHI: 30.9.   Apnea index: 10.8.  Hypopnea index: 20.2. Obstructive apnea index: 7.5.  Central apnea index: 3.3.  Mixed apnea index: 0. REM AHI: 0.  NREM AHI: 30.9. Supine AHI: 38.6. Non-supine AHI: 6.  Titration portion: He was started on CPAP 4 and increased to 12 cm H2O.  He developed central apneas  with higher CPAP pressures.  He was then transitioned to BiPAP starting at 14/10 and increased to 20/16 cm H2O.  He continued to have central apneas.  MOVEMENT/PARASOMNIA:  Diagnostic portion: Periodic limb movement: 52.5.  Period limb movements with arousals: 0.  Titration portion: Periodic limb movement: 0.  Period limb movements with arousals: 0.  Restroom trips: one.  OXYGEN DATA:  Baseline oxygenation: 93%. Lowest SaO2: 77%. Time spent below SaO2 90%: 48.8 minutes. Supplemental oxygen used: none.  IMPRESSION/ RECOMMENDATION:   This study shows severe obstructive sleep apnea with an AHI of 30.9 and SaO2 low of 77%.  He had a significant positional effect to his sleep apnea.  He had an unsuccessful titration  portion of the study.  He developed central apneas with higher CPAP settings, and these were not adequately controlled with BiPAP.  However, he only had limited amount of sleep time using BiPAP.  In addition to weight loss and positional therapy, the patient should return to the sleep lab for a full night titration study.  Alternatively, the patient could be assessed for oral appliance or surgical intervention.   Chesley Mires, M.D. Diplomate, [employer] adviser of Sleep Medicine  ELECTRONICALLY SIGNED ON:  06/15/2013, 8:12 AM [place] PH: [phone]   FX: [phone] [place]

## 2013-06-16 ENCOUNTER — Telehealth: Payer: Self-pay | Admitting: Pulmonary Disease

## 2013-06-16 NOTE — Telephone Encounter (Signed)
Okay to overbook for ROV after 06/25/13.

## 2013-06-16 NOTE — Telephone Encounter (Signed)
Called spoke with pt. Apt scheduled 06/28/13 at 1:30.

## 2013-06-16 NOTE — Telephone Encounter (Signed)
Spoke with pt. Offered appt Monday 06/21/13. He reports he needs something after 06/25/13. Is it okay to overbook VS? thanks

## 2013-06-16 NOTE — Telephone Encounter (Signed)
Patient returning call.

## 2013-06-16 NOTE — Telephone Encounter (Signed)
Call Documentation     Randa Spike, CMA at 06/15/2013 9:25 AM     Status: Signed        lmtcb x1        Chesley Mires, MD at 06/15/2013 8:28 AM     Status: Signed        PSG 06/14/13 >> AHI 30.9, SaO2 low 77%. Sub-optimal titration >> centrals with higher CPAP and BiPAP.  Will have my nurse schedule ROV to review results.   LMTCB

## 2013-06-17 NOTE — Telephone Encounter (Signed)
Per the phone note from 06/16/13 pt is aware and appointment has been made.

## 2013-06-17 NOTE — Telephone Encounter (Signed)
lmtcb x2 

## 2013-06-28 ENCOUNTER — Ambulatory Visit: Payer: Managed Care, Other (non HMO) | Admitting: Pulmonary Disease

## 2013-06-30 ENCOUNTER — Encounter: Payer: Self-pay | Admitting: Pulmonary Disease

## 2013-06-30 ENCOUNTER — Ambulatory Visit (INDEPENDENT_AMBULATORY_CARE_PROVIDER_SITE_OTHER): Payer: Managed Care, Other (non HMO) | Admitting: Pulmonary Disease

## 2013-06-30 VITALS — BP 138/90 | HR 88 | Ht 70.0 in | Wt 197.0 lb

## 2013-06-30 DIAGNOSIS — G4731 Primary central sleep apnea: Secondary | ICD-10-CM

## 2013-06-30 DIAGNOSIS — D751 Secondary polycythemia: Secondary | ICD-10-CM

## 2013-06-30 DIAGNOSIS — G4730 Sleep apnea, unspecified: Secondary | ICD-10-CM

## 2013-06-30 DIAGNOSIS — G4700 Insomnia, unspecified: Secondary | ICD-10-CM

## 2013-06-30 NOTE — Assessment & Plan Note (Signed)
He has severe sleep apnea with significant oxygen desaturation.  He developed central apneas while using CPAP.  I have reviewed the recent sleep study results with the patient.  We discussed how sleep apnea can affect various health problems including risks for hypertension, cardiovascular disease, and diabetes.  We also discussed how sleep disruption can increase risks for accident, such as while driving.  Weight loss as a means of improving sleep apnea was also reviewed.  Additional treatment options discussed were CPAP therapy, oral appliance, and surgical intervention.  Will arrange for auto BiPAP set up.  Will get download from his machine and arrange for ONO with BiPAP and room air.  He may need repeat in lab titration study.

## 2013-06-30 NOTE — Progress Notes (Signed)
Chief Complaint  Patient presents with  . Sleep Apnea    review sleep study    History of Present Illness: Derrick Kelley is a 53 y.o. male with complex sleep apnea, polycythemia, and insomnia.  He is here to review his sleep study.  This showed severe sleep apnea with significant oxygen desaturation.  He was not able to be adequately titrated with CPAP due to central apneas.  He had to restart blood letting because of high hemoglobin levels.  He still has trouble falling asleep and staying asleep.  He is tired all the time.  TESTS: PSG 06/14/13 >> AHI 30.9, SaO2 low 77%. Sub-optimal titration >> centrals with higher CPAP and BiPAP.    Derrick Kelley  has a past medical history of BACK PAIN, CHRONIC (05/11/2009); Flushing (05/11/2009); HYPERLIPIDEMIA; HYPOGONADISM (05/11/2009); INSOMNIA-SLEEP DISORDER-UNSPEC (10/10/2009); POLYCYTHEMIA (05/11/2009); SCIATICA, RIGHT (09/20/2009); SINUSITIS- ACUTE-NOS (10/10/2009); URI (05/11/2009); Arthritis; Chronic back pain; Hemorrhoids; HYPERTENSION; Headache(784.0) (10/10/2009); Joint pain; Joint pain; GERD (05/11/2009); and DEPRESSION.  Derrick Kelley  has past surgical history that includes left clavicle fracture; hx eye surgury 2001 (many yrs ago); hx back surgury lumbar fusion (2007); hx left shoulder surgury (2002 and August 10, 2009); Mouth surgery; Back surgery (2013); left shoulder surgery; Anterior lumbar fusion (01/02/2012); and Abdominal exposure (01/02/2012).  Prior to Admission medications   Medication Sig Start Date End Date Taking? Authorizing Provider  amLODipine (NORVASC) 10 MG tablet Take 1 tablet (10 mg total) by mouth daily. 06/10/13  Yes Webb Silversmith, NP  atorvastatin (LIPITOR) 20 MG tablet Take 1 tablet (20 mg total) by mouth daily. 05/20/13  Yes Webb Silversmith, NP  buprenorphine-naloxone (SUBOXONE) 8-2 MG SUBL SL tablet Place 1 tablet under the tongue daily.   Yes Historical Provider, MD  gabapentin (NEURONTIN) 600 MG tablet Take 2,400 mg  by mouth at bedtime.   Yes Historical Provider, MD  irbesartan (AVAPRO) 150 MG tablet Take 75 mg by mouth daily.   Yes Historical Provider, MD  meloxicam (MOBIC) 15 MG tablet Take 15 mg by mouth daily.   Yes Historical Provider, MD  sertraline (ZOLOFT) 100 MG tablet Take 150 mg by mouth daily.   Yes Historical Provider, MD  testosterone cypionate (DEPOTESTOTERONE CYPIONATE) 200 MG/ML injection Inject 1 mL (200 mg total) into the muscle every 14 (fourteen) days. 1 cc biweekly IM (self Injected)  Last injection was on 04/12/11 01/27/13  Yes Biagio Borg, MD  tiZANidine (ZANAFLEX) 4 MG tablet Take 4 mg by mouth every 8 (eight) hours as needed for muscle spasms.    Yes Historical Provider, MD    Allergies  Allergen Reactions  . Ambien [Zolpidem Tartrate] Other (See Comments)    Memory dysfunction  . Bupropion Hcl     REACTION: nightmares  . Codeine Hives     Physical Exam:  General - No distress ENT - No sinus tenderness, no oral exudate, no LAN, MP 4, enlarged tongue Cardiac - s1s2 regular, no murmur Chest - No wheeze/rales/dullness Back - No focal tenderness Abd - Soft, non-tender Ext - No edema Neuro - Normal strength Skin - No rashes Psych - normal mood, and behavior   Assessment/Plan:  Chesley Mires, MD [facility] Pager:  [phone]

## 2013-06-30 NOTE — Assessment & Plan Note (Signed)
Explained how his issues with sleep disruption may instead be related to sleep apnea.  Will re-assess after he is established on therapy for his sleep apnea.   

## 2013-06-30 NOTE — Assessment & Plan Note (Addendum)
This could be related to nocturnal oxygen desaturations in setting of sleep apnea.  Will need to re-assess his Hb after he is established on therapy for his sleep apnea, and then determine if he needs to continue with blood letting.

## 2013-06-30 NOTE — Patient Instructions (Signed)
Will arrange for BiPAP set up Will arrange for oxygen test while using BiPAP Follow up in 2 months after BiPAP set up

## 2013-07-23 ENCOUNTER — Telehealth: Payer: Self-pay | Admitting: Pulmonary Disease

## 2013-07-23 NOTE — Telephone Encounter (Signed)
[email or web address].Jennifer Castillo, CMA  

## 2013-07-26 NOTE — Telephone Encounter (Signed)
Download received and is in Dr. Collie Siad look-at to review. Please advise. Racine Bing, CMA

## 2013-07-26 NOTE — Telephone Encounter (Signed)
Derrick Kelley returned call. Informed Derrick Kelley that when Dr. Halford Chessman is in office he will review the download and advise from there.   Derrick Kelley stated they cannot change the pressure if the pt needed but Huey Romans would have to be notified of pressure change. Phone: [phone]. Fax: [phone].   Will forward to [place] to follow up on.

## 2013-07-27 NOTE — Telephone Encounter (Signed)
Auto BiPAP 07/06/13 to 07/21/13 >> used on 13 of 16 nights with average 4 hrs and 5 min.  Average AHI is 47 with median BiPAP 12/8 cm H2O and 95 th percentile CPAP 17/12 cm H2O.  ONO with BiPAP and RA 07/21/13 >> Test time 7 hrs 28 min.  Basal SpO2 93%, low SpO2 75%.  Spent 17.9 min with SpO2 < 88%.  Discussed results with pt.  He informed me that for the past week he has been sleeping with the head of his bed elevated.  With this position he is sleeping much better while using BiPAP.  He also had issues with mask fit and air leak, but this has gotten better also.  He would like to defer any changes to his set up at present.  He would also like to defer additional sleep testing at this time.  Advised him to call if he has further difficulties.  Otherwise will f/u at next ROV.

## 2013-09-02 ENCOUNTER — Ambulatory Visit (INDEPENDENT_AMBULATORY_CARE_PROVIDER_SITE_OTHER): Payer: Managed Care, Other (non HMO) | Admitting: Pulmonary Disease

## 2013-09-02 ENCOUNTER — Encounter: Payer: Self-pay | Admitting: Pulmonary Disease

## 2013-09-02 VITALS — BP 120/82 | HR 81 | Temp 97.0°F | Ht 70.0 in | Wt 198.4 lb

## 2013-09-02 DIAGNOSIS — G4700 Insomnia, unspecified: Secondary | ICD-10-CM

## 2013-09-02 DIAGNOSIS — G4731 Primary central sleep apnea: Secondary | ICD-10-CM

## 2013-09-02 DIAGNOSIS — G4737 Central sleep apnea in conditions classified elsewhere: Secondary | ICD-10-CM

## 2013-09-02 DIAGNOSIS — G4733 Obstructive sleep apnea (adult) (pediatric): Secondary | ICD-10-CM

## 2013-09-02 NOTE — Patient Instructions (Signed)
Will arrange for BiPAP titration study Follow up in 3 months

## 2013-09-02 NOTE — Assessment & Plan Note (Signed)
Explained how his issues with sleep disruption may instead be related to sleep apnea.  Will re-assess after he is established on therapy for his sleep apnea.

## 2013-09-02 NOTE — Assessment & Plan Note (Signed)
He has not been able to be adequately titrated at home.  Will arrange for in lab BiPAP titration study.

## 2013-09-02 NOTE — Progress Notes (Signed)
Chief Complaint  Patient presents with  . Follow-up    BiPAP> pt reports increase mask leak d/t beard. Denies problems with pressure. Pt states that he does not feel as though he is benefiting very well from it. Pt c/o lack in retaining momory.    History of Present Illness: Derrick Kelley is a 53 y.o. male with complex sleep apnea, polycythemia, and insomnia.  He continues to have trouble with his sleep.  He is getting leak from his mask.  His download shows elevated AHI still in spite of using his BiPAP.  He has to take muscle relaxant medicines for his back, and he notices more trouble with his breathing when he is falling asleep.   TESTS: PSG 06/14/13 >> AHI 30.9, SaO2 low 77%. Sub-optimal titration >> centrals with higher CPAP and BiPAP.   PMHx, PSHx, Medications, Allergies, Fhx, Shx reviewed.   Physical Exam:  General - No distress ENT - No sinus tenderness, no oral exudate, no LAN, MP 4, enlarged tongue Cardiac - s1s2 regular, no murmur Chest - No wheeze/rales/dullness Back - No focal tenderness Abd - Soft, non-tender Ext - No edema Neuro - Normal strength Skin - No rashes Psych - normal mood, and behavior   Assessment/Plan:  Chesley Mires, MD [facility] Pager:  [phone]

## 2013-09-15 ENCOUNTER — Ambulatory Visit (HOSPITAL_BASED_OUTPATIENT_CLINIC_OR_DEPARTMENT_OTHER): Payer: Managed Care, Other (non HMO) | Attending: Pulmonary Disease | Admitting: Radiology

## 2013-09-15 ENCOUNTER — Telehealth: Payer: Self-pay | Admitting: Pulmonary Disease

## 2013-09-15 DIAGNOSIS — G4731 Primary central sleep apnea: Secondary | ICD-10-CM

## 2013-09-15 DIAGNOSIS — Z9989 Dependence on other enabling machines and devices: Secondary | ICD-10-CM

## 2013-09-15 DIAGNOSIS — G4733 Obstructive sleep apnea (adult) (pediatric): Secondary | ICD-10-CM

## 2013-09-15 DIAGNOSIS — G4730 Sleep apnea, unspecified: Principal | ICD-10-CM

## 2013-09-15 DIAGNOSIS — G4700 Insomnia, unspecified: Secondary | ICD-10-CM | POA: Insufficient documentation

## 2013-09-15 DIAGNOSIS — D45 Polycythemia vera: Secondary | ICD-10-CM | POA: Insufficient documentation

## 2013-09-15 NOTE — Telephone Encounter (Signed)
Ok for him to take the zanaflex if that is what he usually takes at night for sleep

## 2013-09-15 NOTE — Telephone Encounter (Signed)
Called pt. Aware of recs. Nothing further needed

## 2013-09-15 NOTE — Telephone Encounter (Signed)
Called and spoke to pt. Pt has sleep study tonight at 8pm and was unsure whether to take his Zanafalx to help him sleep. Pt normally takes the zanaflex to help him sleep d/t insomnia. Pt was wondering if he should not take the Zanaflex then is he able to be scripted a one time dose for Ambien, pt has taken [place] before with no trouble.   [place] please advise.   Allergies  Allergen Reactions  . Ambien [Zolpidem Tartrate] Other (See Comments)    Memory dysfunction  . Bupropion Hcl     REACTION: nightmares  . Codeine Hives     Current Outpatient Prescriptions on File Prior to Visit  Medication Sig Dispense Refill  . amLODipine (NORVASC) 10 MG tablet Take 1 tablet (10 mg total) by mouth daily.  30 tablet  2  . atorvastatin (LIPITOR) 20 MG tablet Take 1 tablet (20 mg total) by mouth daily.  90 tablet  0  . buprenorphine-naloxone (SUBOXONE) 8-2 MG SUBL SL tablet Place 1 tablet under the tongue daily.      Marland Kitchen gabapentin (NEURONTIN) 600 MG tablet Take 2,400 mg by mouth at bedtime.      . irbesartan (AVAPRO) 150 MG tablet Take 150 mg by mouth daily.       . sertraline (ZOLOFT) 100 MG tablet Take 150 mg by mouth daily.      Marland Kitchen testosterone cypionate (DEPOTESTOTERONE CYPIONATE) 200 MG/ML injection Inject 1 mL (200 mg total) into the muscle every 14 (fourteen) days. 1 cc biweekly IM (self Injected)  Last injection was on 04/12/11  10 mL  5  . tiZANidine (ZANAFLEX) 4 MG tablet Take 4 mg by mouth every 8 (eight) hours as needed for muscle spasms.        No current facility-administered medications on file prior to visit.

## 2013-09-24 ENCOUNTER — Other Ambulatory Visit: Payer: Self-pay | Admitting: Internal Medicine

## 2013-09-28 ENCOUNTER — Telehealth: Payer: Self-pay | Admitting: Pulmonary Disease

## 2013-09-28 DIAGNOSIS — G4733 Obstructive sleep apnea (adult) (pediatric): Secondary | ICD-10-CM

## 2013-09-28 DIAGNOSIS — G4731 Primary central sleep apnea: Secondary | ICD-10-CM

## 2013-09-28 DIAGNOSIS — G4739 Other sleep apnea: Secondary | ICD-10-CM

## 2013-09-28 NOTE — Sleep Study (Signed)
Derrick Kelley  NAME: Derrick Kelley DATE OF BIRTH:  1960/03/16 MEDICAL RECORD NUMBER [id number]  LOCATION: [facility]  PHYSICIAN: Chesley Mires, M.D. DATE OF STUDY: 09/15/2013  SLEEP STUDY TYPE: BiPAP Titration               REFERRING PHYSICIAN: Chesley Mires, MD  INDICATION FOR STUDY:  [name] is a 53 y.o. male who presents to the sleep lab for evaluation of hypersomnia with obstructive sleep apnea.  He reports snoring, sleep disruption, apnea, and daytime sleepiness.  EPWORTH SLEEPINESS SCORE: 5. HEIGHT: 5\' 9"  WEIGHT: 185 lbs   BMI: 27  NECK SIZE: 17 in.  MEDICATIONS:  Current Outpatient Prescriptions on File Prior to Visit  Medication Sig Dispense Refill  . atorvastatin (LIPITOR) 20 MG tablet Take 1 tablet (20 mg total) by mouth daily.  90 tablet  0  . buprenorphine-naloxone (SUBOXONE) 8-2 MG SUBL SL tablet Place 1 tablet under the tongue daily.      Marland Kitchen gabapentin (NEURONTIN) 600 MG tablet Take 2,400 mg by mouth at bedtime.      . irbesartan (AVAPRO) 150 MG tablet Take 150 mg by mouth daily.       . sertraline (ZOLOFT) 100 MG tablet Take 150 mg by mouth daily.      Marland Kitchen testosterone cypionate (DEPOTESTOTERONE CYPIONATE) 200 MG/ML injection Inject 1 mL (200 mg total) into the muscle every 14 (fourteen) days. 1 cc biweekly IM (self Injected)  Last injection was on 04/12/11  10 mL  5  . tiZANidine (ZANAFLEX) 4 MG tablet Take 4 mg by mouth every 8 (eight) hours as needed for muscle spasms.        No current facility-administered medications on file prior to visit.    SLEEP ARCHITECTURE:  Total recording time: 391.5 minutes.  Total sleep time was: 280 minutes.  Sleep efficiency: 71.5%.  Sleep latency: 100 minutes.  REM latency: 173.5 minutes.  Stage N1: 6.8%.  Stage N2: 91.6%.  Stage N3: 0%.  Stage R:  1.6%.  Supine sleep: 280 minutes.  Non-supine sleep: 0 minutes.  CARDIAC DATA:  Average heart rate: 67 beats per minute. Rhythm  strip: sinus rhythm with PVC's.  RESPIRATORY DATA: Average respiratory rate: 16.  He was started on BiPAP 8/4 cm H2O and increased to BiPAP 15/12 cm H2O.  He had reasonable control of his obstructive events with BiPAP, but continue to have central apneic events.  MOVEMENT/PARASOMNIA:  Periodic limb movement: 0.  Period limb movements with arousals: 0. Restroom trips: one.  OXYGEN DATA:  Baseline oxygenation: 95%. Lowest SaO2: 89%. Time spent below SaO2 90%: 1.3 minutes. Supplemental oxygen used: none.  IMPRESSION/ RECOMMENDATION:   This was a sub optimal titration study.  While he did have control of his obstructive events with BiPAP, he did have persistent central events.   Will arrange for BiPAP 14/10 cm H2O.  Will then monitor his clinical progress.   Chesley Mires, M.D. Diplomate, [employer] adviser of Sleep Medicine  ELECTRONICALLY SIGNED ON:  09/28/2013, 4:02 PM [place] PH: [phone]   FX: [phone] [place]

## 2013-09-28 NOTE — Telephone Encounter (Signed)
BiPAP 09/15/13 >> Sub optimal titration >> persistent Centrals >> BiPAP 14/10 cm H2O seemed optimal.  Will have my nurse inform pt that order sent to change his BiPAP setting to 14/10 cm H2O.

## 2013-09-29 NOTE — Telephone Encounter (Signed)
lmtcb x1 

## 2013-09-30 ENCOUNTER — Ambulatory Visit (INDEPENDENT_AMBULATORY_CARE_PROVIDER_SITE_OTHER): Payer: Managed Care, Other (non HMO) | Admitting: Internal Medicine

## 2013-09-30 ENCOUNTER — Encounter: Payer: Self-pay | Admitting: Internal Medicine

## 2013-09-30 VITALS — BP 118/66 | HR 74 | Temp 98.1°F | Wt 200.0 lb

## 2013-09-30 DIAGNOSIS — E785 Hyperlipidemia, unspecified: Secondary | ICD-10-CM

## 2013-09-30 DIAGNOSIS — E291 Testicular hypofunction: Secondary | ICD-10-CM

## 2013-09-30 DIAGNOSIS — N400 Enlarged prostate without lower urinary tract symptoms: Secondary | ICD-10-CM

## 2013-09-30 DIAGNOSIS — K219 Gastro-esophageal reflux disease without esophagitis: Secondary | ICD-10-CM

## 2013-09-30 DIAGNOSIS — Z23 Encounter for immunization: Secondary | ICD-10-CM

## 2013-09-30 DIAGNOSIS — M549 Dorsalgia, unspecified: Secondary | ICD-10-CM

## 2013-09-30 DIAGNOSIS — I10 Essential (primary) hypertension: Secondary | ICD-10-CM

## 2013-09-30 DIAGNOSIS — F3289 Other specified depressive episodes: Secondary | ICD-10-CM

## 2013-09-30 DIAGNOSIS — G4700 Insomnia, unspecified: Secondary | ICD-10-CM

## 2013-09-30 DIAGNOSIS — F32A Depression, unspecified: Secondary | ICD-10-CM

## 2013-09-30 DIAGNOSIS — F329 Major depressive disorder, single episode, unspecified: Secondary | ICD-10-CM

## 2013-09-30 DIAGNOSIS — G8929 Other chronic pain: Secondary | ICD-10-CM

## 2013-09-30 DIAGNOSIS — D751 Secondary polycythemia: Secondary | ICD-10-CM

## 2013-09-30 LAB — LIPID PANEL
CHOLESTEROL: 154 mg/dL (ref 0–200)
HDL: 40.6 mg/dL (ref >39.00)
LDL Cholesterol: 85 mg/dL (ref 0–99)
NonHDL: 113.4
TRIGLYCERIDES: 143 mg/dL (ref 0.0–149.0)
Total CHOL/HDL Ratio: 4
VLDL: 28.6 mg/dL (ref 0.0–40.0)

## 2013-09-30 LAB — CBC
HCT: 49.3 % (ref 39.0–52.0)
HEMOGLOBIN: 16.3 g/dL (ref 13.0–17.0)
MCHC: 33.1 g/dL (ref 30.0–36.0)
MCV: 94.4 fl (ref 78.0–100.0)
PLATELETS: 158 10*3/uL (ref 150.0–400.0)
RBC: 5.23 Mil/uL (ref 4.22–5.81)
RDW: 13.8 % (ref 11.5–15.5)
WBC: 8.8 10*3/uL (ref 4.0–10.5)

## 2013-09-30 LAB — COMPREHENSIVE METABOLIC PANEL
ALT: 19 U/L (ref 0–53)
AST: 24 U/L (ref 0–37)
Albumin: 3.8 g/dL (ref 3.5–5.2)
Alkaline Phosphatase: 59 U/L (ref 39–117)
BUN: 16 mg/dL (ref 6–23)
CALCIUM: 9.3 mg/dL (ref 8.4–10.5)
CHLORIDE: 100 meq/L (ref 96–112)
CO2: 33 meq/L — AB (ref 19–32)
CREATININE: 1.6 mg/dL — AB (ref 0.4–1.5)
GFR: 48.98 mL/min — AB (ref >60.00)
Glucose, Bld: 123 mg/dL — ABNORMAL HIGH (ref 70–99)
Potassium: 4.2 mEq/L (ref 3.5–5.1)
Sodium: 138 mEq/L (ref 135–145)
Total Bilirubin: 0.5 mg/dL (ref 0.2–1.2)
Total Protein: 6.4 g/dL (ref 6.0–8.3)

## 2013-09-30 MED ORDER — TRAZODONE HCL 50 MG PO TABS: 50.0000 mg | ORAL_TABLET | Freq: Every evening | ORAL | Status: DC | PRN

## 2013-09-30 NOTE — Assessment & Plan Note (Signed)
Not medicated Diet controlled

## 2013-09-30 NOTE — Assessment & Plan Note (Signed)
Recent increase in lipitor Will recheck lipid profile today

## 2013-09-30 NOTE — Assessment & Plan Note (Signed)
On testosterone injection- continue

## 2013-09-30 NOTE — Patient Instructions (Addendum)
Insomnia Insomnia is frequent trouble falling and/or staying asleep. Insomnia can be a long term problem or a short term problem. Both are common. Insomnia can be a short term problem when the wakefulness is related to a certain stress or worry. Long term insomnia is often related to ongoing stress during waking hours and/or poor sleeping habits. Overtime, sleep deprivation itself can make the problem worse. Every little thing feels more severe because you are overtired and your ability to cope is decreased. CAUSES   Stress, anxiety, and depression.  Poor sleeping habits.  Distractions such as TV in the bedroom.  Naps close to bedtime.  Engaging in emotionally charged conversations before bed.  Technical reading before sleep.  Alcohol and other sedatives. They may make the problem worse. They can hurt normal sleep patterns and normal dream activity.  Stimulants such as caffeine for several hours prior to bedtime.  Pain syndromes and shortness of breath can cause insomnia.  Exercise late at night.  Changing time zones may cause sleeping problems (jet lag). It is sometimes helpful to have someone observe your sleeping patterns. They should look for periods of not breathing during the night (sleep apnea). They should also look to see how long those periods last. If you live alone or observers are uncertain, you can also be observed at a sleep clinic where your sleep patterns will be professionally monitored. Sleep apnea requires a checkup and treatment. Give your caregivers your medical history. Give your caregivers observations your family has made about your sleep.  SYMPTOMS   Not feeling rested in the morning.  Anxiety and restlessness at bedtime.  Difficulty falling and staying asleep. TREATMENT   Your caregiver may prescribe treatment for an underlying medical disorders. Your caregiver can give advice or help if you are using alcohol or other drugs for self-medication. Treatment  of underlying problems will usually eliminate insomnia problems.  Medications can be prescribed for short time use. They are generally not recommended for lengthy use.  Over-the-counter sleep medicines are not recommended for lengthy use. They can be habit forming.  You can promote easier sleeping by making lifestyle changes such as:  Using relaxation techniques that help with breathing and reduce muscle tension.  Exercising earlier in the day.  Changing your diet and the time of your last meal. No night time snacks.  Establish a regular time to go to bed.  Counseling can help with stressful problems and worry.  Soothing music and white noise may be helpful if there are background noises you cannot remove.  Stop tedious detailed work at least one hour before bedtime. HOME CARE INSTRUCTIONS   Keep a diary. Inform your caregiver about your progress. This includes any medication side effects. See your caregiver regularly. Take note of:  Times when you are asleep.  Times when you are awake during the night.  The quality of your sleep.  How you feel the next day. This information will help your caregiver care for you.  Get out of bed if you are still awake after 15 minutes. Read or do some quiet activity. Keep the lights down. Wait until you feel sleepy and go back to bed.  Keep regular sleeping and waking hours. Avoid naps.  Exercise regularly.  Avoid distractions at bedtime. Distractions include watching television or engaging in any intense or detailed activity like attempting to balance the household checkbook.  Develop a bedtime ritual. Keep a familiar routine of bathing, brushing your teeth, climbing into bed at the same   time each night, listening to soothing music. Routines increase the success of falling to sleep faster.  Use relaxation techniques. This can be using breathing and muscle tension release routines. It can also include visualizing peaceful scenes. You can  also help control troubling or intruding thoughts by keeping your mind occupied with boring or repetitive thoughts like the old concept of counting sheep. You can make it more creative like imagining planting one beautiful flower after another in your backyard garden.  During your day, work to eliminate stress. When this is not possible use some of the previous suggestions to help reduce the anxiety that accompanies stressful situations. MAKE SURE YOU:   Understand these instructions.  Will watch your condition.  Will get help right away if you are not doing well or get worse. Document Released: 01/26/2000 Document Revised: 04/22/2011 Document Reviewed: 02/25/2007 ExitCare Patient Information 2015 [employer]. This information is not intended to replace advice given to you by your health care provider. Make sure you discuss any questions you have with your health care provider.  

## 2013-09-30 NOTE — Assessment & Plan Note (Signed)
Will check CBC today.  

## 2013-09-30 NOTE — Assessment & Plan Note (Signed)
No better on CPAP Continue to follow with pulmonolgy Will try trazadone  Let me know in 1 month how you are doing on this medication

## 2013-09-30 NOTE — Progress Notes (Signed)
Pre visit review using our clinic review tool, if applicable. No additional management support is needed unless otherwise documented below in the visit note. 

## 2013-09-30 NOTE — Telephone Encounter (Signed)
Pt returning call.Derrick Kelley ° °

## 2013-09-30 NOTE — Assessment & Plan Note (Signed)
Controlled on current dose of zoloft

## 2013-09-30 NOTE — Assessment & Plan Note (Signed)
Well controlled on Avapro and Norvasc Will check CBC and CMET today

## 2013-09-30 NOTE — Addendum Note (Signed)
Addended by: Lurlean Nanny on: 09/30/2013 04:18 PM   Modules accepted: Orders

## 2013-09-30 NOTE — Assessment & Plan Note (Signed)
He did restart his flomax-continue for now

## 2013-09-30 NOTE — Progress Notes (Signed)
Subjective:    Patient ID: Derrick Kelley, male    DOB: 1960/04/24, 53 y.o.   MRN: [id number]  HPI  Pt presents to the clinic today for 6 month follow up of chronic medical conditions.  Depression: Stable on current dose of Zoloft.  GERD: Diet controlled. Not medicated at this time.  HTN: On Avapro and Norvasc. Did not come back in May to have blood pressure checked. No more dizzy spells.  HLD: Lipitor recently increased to 20 mg daily. Also taking Fish Oil daily.  Insomnia: Secondary to OSA. Being followed by Pulm. On CPAP but not effective. They have been working on titrating his CPAP. He is also taking gabapentin and muscle relaxers to try to help him sleep but they are not effective either.  Chronic back pain: On suboxone, flexeril and Neurontin. Working with pain management.  Hypogonadism: Injects 200 mg of testosterone bi weekly.  BPH: Nocturia is the worst symptoms. He reports that he is currently takign the Flomax after I asked him to stop. He is not having any adverse effects from the medication.  Review of Systems  Past Medical History  Diagnosis Date  . BACK PAIN, CHRONIC 05/11/2009    bone on bone;hx of herniated disc  . [place] 05/11/2009  . HYPERLIPIDEMIA     takes Lipitor daily  . HYPOGONADISM 05/11/2009    takes Testosterone every 14days  . INSOMNIA-SLEEP DISORDER-UNSPEC 10/10/2009    takes Ambien nightly  . POLYCYTHEMIA 05/11/2009  . SCIATICA, RIGHT 09/20/2009  . SINUSITIS- ACUTE-NOS 10/10/2009  . URI 05/11/2009  . Arthritis   . Chronic back pain     hx buldging disc;  . Hemorrhoids     hx of  . HYPERTENSION     takes Avapro,Amlodipine,and Lisinopril daily  . Headache(784.0) 10/10/2009    cluster;last one about a yr ago  . Joint pain   . Joint pain   . GERD 05/11/2009    but doesn't require meds  . DEPRESSION     takes Zoloft daily    Current Outpatient Prescriptions  Medication Sig Dispense Refill  . amLODipine (NORVASC) 10 MG tablet TAKE 1  TABLET BY MOUTH DAILY  30 tablet  0  . atorvastatin (LIPITOR) 20 MG tablet Take 1 tablet (20 mg total) by mouth daily.  90 tablet  0  . buprenorphine-naloxone (SUBOXONE) 8-2 MG SUBL SL tablet Place 1 tablet under the tongue daily.      Marland Kitchen gabapentin (NEURONTIN) 600 MG tablet Take 2,400 mg by mouth at bedtime.      . irbesartan (AVAPRO) 150 MG tablet Take 150 mg by mouth daily.       . sertraline (ZOLOFT) 100 MG tablet Take 150 mg by mouth daily.      Marland Kitchen testosterone cypionate (DEPOTESTOTERONE CYPIONATE) 200 MG/ML injection Inject 1 mL (200 mg total) into the muscle every 14 (fourteen) days. 1 cc biweekly IM (self Injected)  Last injection was on 04/12/11  10 mL  5  . tiZANidine (ZANAFLEX) 4 MG tablet Take 4 mg by mouth every 8 (eight) hours as needed for muscle spasms.        No current facility-administered medications for this visit.    Allergies  Allergen Reactions  . Ambien [Zolpidem Tartrate] Other (See Comments)    Memory dysfunction  . Bupropion Hcl     REACTION: nightmares  . Codeine Hives    Family History  Problem Relation Age of Onset  . Alcohol abuse Father   . Cancer  Father     prostate  . Alcohol abuse Maternal Aunt   . Alcohol abuse Maternal Uncle   . Cancer Paternal Uncle     prostate  . Cancer Maternal Grandmother     ovarian cancer  . Alcohol abuse Other   . Anesthesia problems Neg Hx   . Hypotension Neg Hx   . Malignant hyperthermia Neg Hx   . Pseudochol deficiency Neg Hx     History   Social History  . Marital Status: Married    Spouse Name: N/A    Number of Children: 2  . Years of Education: N/A   Occupational History  . unemployed    Social History Main Topics  . Smoking status: Never Smoker   . Smokeless tobacco: Never Used  . Alcohol Use: Yes     Comment: occasional  . Drug Use: No     Comment: former cocaine, none for years  . Sexual Activity: Yes   Other Topics Concern  . Not on file   Social History Narrative  . No narrative on  file     Constitutional: Denies fever, malaise, fatigue, headache or abrupt weight changes.  Respiratory: Denies difficulty breathing, shortness of breath, cough or sputum production.   Cardiovascular: Denies chest pain, chest tightness, palpitations or swelling in the hands or feet.  Gastrointestinal: Denies abdominal pain, bloating, constipation, diarrhea or blood in the stool.  GU: Pt reports nocturia. Denies urgency, frequency, pain with urination, burning sensation, blood in urine, odor or discharge. Musculoskeletal: Pt reports chronic back pain. Denies decrease in range of motion, difficulty with gait,  or joint pain and swelling.   No other specific complaints in a complete review of systems (except as listed in HPI above).     Objective:   Physical Exam   BP 118/66  Pulse 74  Temp(Src) 98.1 F (36.7 C) (Oral)  Wt 200 lb (90.719 kg)  SpO2 98% Wt Readings from Last 3 Encounters:  09/30/13 200 lb (90.719 kg)  09/02/13 198 lb 6.4 oz (89.994 kg)  06/30/13 197 lb (89.359 kg)    General: Appears his stated age, well developed, well nourished in NAD. Cardiovascular: Normal rate and rhythm. S1,S2 noted.  No murmur, rubs or gallops noted. No JVD or BLE edema. No carotid bruits noted. Pulmonary/Chest: Normal effort and positive vesicular breath sounds. No respiratory distress. No wheezes, rales or ronchi noted.  Abdomen: Soft and nontender. Normal bowel sounds, no bruits noted. No distention or masses noted. Liver, spleen and kidneys non palpable.    BMET    Component Value Date/Time   NA 138 05/11/2013 1346   K 4.0 05/11/2013 1346   CL 104 05/11/2013 1346   CO2 27 05/11/2013 1346   GLUCOSE 85 05/11/2013 1346   BUN 14 05/11/2013 1346   CREATININE 1.38* 05/11/2013 1346   CALCIUM 8.0* 05/11/2013 1346   GFRNONAA 57* 05/11/2013 1346   GFRAA 66* 05/11/2013 1346    Lipid Panel     Component Value Date/Time   CHOL 222* 03/17/2013 1442   TRIG 424.0* 03/17/2013 1442   HDL 33.30*  03/17/2013 1442   CHOLHDL 7 03/17/2013 1442   VLDL 84.8* 03/17/2013 1442   LDLCALC 82 11/06/2011 1301    CBC    Component Value Date/Time   WBC 5.7 05/11/2013 1346   WBC 7.4 01/02/2010 1416   RBC 4.09* 05/11/2013 1346   RBC 4.86 01/02/2010 1416   HGB 13.8 05/11/2013 1346   HGB Unable to Determine 01/02/2010 1416  HCT 38.4* 05/11/2013 1346   HCT 41.2 01/02/2010 1416   PLT 126* 05/11/2013 1346   PLT 208 01/02/2010 1416   MCV 93.9 05/11/2013 1346   MCV 84.8 01/02/2010 1416   MCH 33.7 05/11/2013 1346   MCH Unable to determine 01/02/2010 1416   MCHC 35.9 05/11/2013 1346   MCHC Unable to determine 01/02/2010 1416   RDW 13.0 05/11/2013 1346   RDW 12.9 01/02/2010 1416   LYMPHSABS 1.0 05/11/2013 1346   LYMPHSABS 1.6 01/02/2010 1416   MONOABS 0.6 05/11/2013 1346   MONOABS 0.4 01/02/2010 1416   EOSABS 0.1 05/11/2013 1346   EOSABS 0.1 01/02/2010 1416   BASOSABS 0.0 05/11/2013 1346   BASOSABS 0.0 01/02/2010 1416    Hgb A1C No results found for this basename: HGBA1C        Assessment & Plan:   Flu and Tdap today

## 2013-09-30 NOTE — Telephone Encounter (Signed)
lmtcb x1 

## 2013-09-30 NOTE — Assessment & Plan Note (Signed)
Works with pain management On suboxone, flexeril and gabapentin

## 2013-10-04 NOTE — Telephone Encounter (Signed)
Results have been explained to patient, pt expressed understanding. Nothing further needed.  

## 2013-10-05 ENCOUNTER — Encounter: Payer: Self-pay | Admitting: Pulmonary Disease

## 2013-10-08 ENCOUNTER — Telehealth: Payer: Self-pay

## 2013-10-08 NOTE — Telephone Encounter (Signed)
Pt left v/m pt was seen on 09/30/13; sleep med is not strong enough and request increase in dosage;pt said discussed at visit. pt request [facility].

## 2013-10-10 NOTE — Telephone Encounter (Signed)
Mel- ok to send in 100 mg tabs QHS. # 30, 0 refills

## 2013-10-11 ENCOUNTER — Other Ambulatory Visit: Payer: Self-pay

## 2013-10-11 DIAGNOSIS — G4700 Insomnia, unspecified: Secondary | ICD-10-CM

## 2013-10-11 MED ORDER — TRAZODONE HCL 100 MG PO TABS: 100.0000 mg | ORAL_TABLET | Freq: Every day | ORAL | Status: DC

## 2013-10-11 NOTE — Telephone Encounter (Signed)
Rx changed as instructed and sent to pharmacy--

## 2013-10-28 ENCOUNTER — Other Ambulatory Visit: Payer: Self-pay | Admitting: Internal Medicine

## 2013-10-29 MED ORDER — ATORVASTATIN CALCIUM 20 MG PO TABS: 20.0000 mg | ORAL_TABLET | Freq: Every day | ORAL | Status: DC

## 2013-12-07 ENCOUNTER — Other Ambulatory Visit: Payer: Self-pay | Admitting: Internal Medicine

## 2013-12-08 ENCOUNTER — Encounter: Payer: Self-pay | Admitting: Internal Medicine

## 2013-12-10 ENCOUNTER — Telehealth: Payer: Self-pay

## 2013-12-10 ENCOUNTER — Other Ambulatory Visit: Payer: Self-pay | Admitting: Internal Medicine

## 2013-12-10 MED ORDER — SERTRALINE HCL 100 MG PO TABS
150.0000 mg | ORAL_TABLET | Freq: Every day | ORAL | Status: DC
Start: 2013-12-10 — End: 2014-06-17

## 2013-12-10 NOTE — Telephone Encounter (Signed)
Called pt but wife answered phone--however she is not listed as a person that i can release PHI so i asked for her to see if he could return my call at his earliest convenience

## 2013-12-10 NOTE — Telephone Encounter (Signed)
Per Ebony Hail C pt called back and she relayed information as instructed below to pt

## 2013-12-10 NOTE — Telephone Encounter (Signed)
Zoloft sent to [facility]

## 2013-12-10 NOTE — Telephone Encounter (Signed)
Pt left v/m requesting refill on z[facility]ft [facility]; pt last got zoloft from Dr Jenny Reichmann prior to changing to [facility]; pt has been out of med 2 days and pt is not supposed to go without taking zoloft and pt request to be refilled today.Please advise.pt request cb.

## 2013-12-30 ENCOUNTER — Other Ambulatory Visit: Payer: Self-pay | Admitting: Internal Medicine

## 2014-01-04 ENCOUNTER — Ambulatory Visit: Payer: Self-pay | Admitting: Pain Medicine

## 2014-01-04 ENCOUNTER — Ambulatory Visit: Payer: Managed Care, Other (non HMO) | Admitting: Pulmonary Disease

## 2014-01-05 ENCOUNTER — Other Ambulatory Visit: Payer: Self-pay | Admitting: Orthopedic Surgery

## 2014-01-10 ENCOUNTER — Other Ambulatory Visit: Payer: Self-pay | Admitting: Internal Medicine

## 2014-01-12 ENCOUNTER — Ambulatory Visit: Payer: Self-pay | Admitting: Pain Medicine

## 2014-01-19 ENCOUNTER — Encounter (HOSPITAL_COMMUNITY)
Admission: RE | Admit: 2014-01-19 | Discharge: 2014-01-19 | Disposition: A | Discharge status: Home / Self Care | Payer: Managed Care, Other (non HMO) | Source: Ambulatory Visit | Attending: Orthopedic Surgery | Admitting: Orthopedic Surgery

## 2014-01-19 ENCOUNTER — Encounter (HOSPITAL_COMMUNITY): Payer: Self-pay

## 2014-01-19 DIAGNOSIS — I4581 Long QT syndrome: Secondary | ICD-10-CM | POA: Diagnosis not present

## 2014-01-19 DIAGNOSIS — E785 Hyperlipidemia, unspecified: Secondary | ICD-10-CM | POA: Insufficient documentation

## 2014-01-19 DIAGNOSIS — I10 Essential (primary) hypertension: Secondary | ICD-10-CM | POA: Diagnosis not present

## 2014-01-19 DIAGNOSIS — Z01818 Encounter for other preprocedural examination: Secondary | ICD-10-CM | POA: Insufficient documentation

## 2014-01-19 DIAGNOSIS — D751 Secondary polycythemia: Secondary | ICD-10-CM | POA: Insufficient documentation

## 2014-01-19 DIAGNOSIS — Z6829 Body mass index (BMI) 29.0-29.9, adult: Secondary | ICD-10-CM | POA: Diagnosis not present

## 2014-01-19 HISTORY — DX: Sleep apnea, unspecified: G47.30

## 2014-01-19 LAB — URINALYSIS, ROUTINE W REFLEX MICROSCOPIC
Glucose, UA: NEGATIVE mg/dL
Hgb urine dipstick: NEGATIVE
Ketones, ur: 15 mg/dL — AB
Leukocytes, UA: NEGATIVE
Nitrite: NEGATIVE
PROTEIN: NEGATIVE mg/dL
SPECIFIC GRAVITY, URINE: 1.035 — AB (ref 1.005–1.030)
UROBILINOGEN UA: 0.2 mg/dL (ref 0.0–1.0)
pH: 5 (ref 5.0–8.0)

## 2014-01-19 LAB — CBC WITH DIFFERENTIAL/PLATELET
BASOS ABS: 0 10*3/uL (ref 0.0–0.1)
BASOS PCT: 0 % (ref 0–1)
Eosinophils Absolute: 0.1 10*3/uL (ref 0.0–0.7)
Eosinophils Relative: 1 % (ref 0–5)
HCT: 48.6 % (ref 39.0–52.0)
Hemoglobin: 17.7 g/dL — ABNORMAL HIGH (ref 13.0–17.0)
Lymphocytes Relative: 11 % — ABNORMAL LOW (ref 12–46)
Lymphs Abs: 0.9 10*3/uL (ref 0.7–4.0)
MCH: 33.1 pg (ref 26.0–34.0)
MCHC: 36.4 g/dL — AB (ref 30.0–36.0)
MCV: 90.8 fL (ref 78.0–100.0)
Monocytes Absolute: 0.7 10*3/uL (ref 0.1–1.0)
Monocytes Relative: 9 % (ref 3–12)
Neutro Abs: 6.5 10*3/uL (ref 1.7–7.7)
Neutrophils Relative %: 79 % — ABNORMAL HIGH (ref 43–77)
Platelets: 173 10*3/uL (ref 150–400)
RBC: 5.35 MIL/uL (ref 4.22–5.81)
RDW: 13.5 % (ref 11.5–15.5)
WBC: 8.2 10*3/uL (ref 4.0–10.5)

## 2014-01-19 LAB — COMPREHENSIVE METABOLIC PANEL
ALBUMIN: 4 g/dL (ref 3.5–5.2)
ALK PHOS: 67 U/L (ref 39–117)
ALT: 23 U/L (ref 0–53)
AST: 22 U/L (ref 0–37)
Anion gap: 13 (ref 5–15)
BUN: 21 mg/dL (ref 6–23)
CHLORIDE: 100 meq/L (ref 96–112)
CO2: 24 mEq/L (ref 19–32)
CREATININE: 1.06 mg/dL (ref 0.50–1.35)
Calcium: 9.4 mg/dL (ref 8.4–10.5)
GFR calc Af Amer: >90 mL/min (ref >90)
GFR calc non Af Amer: 78 mL/min — ABNORMAL LOW (ref >90)
Glucose, Bld: 75 mg/dL (ref 70–99)
POTASSIUM: 4.2 meq/L (ref 3.7–5.3)
Sodium: 137 mEq/L (ref 137–147)
Total Bilirubin: 0.7 mg/dL (ref 0.3–1.2)
Total Protein: 6.9 g/dL (ref 6.0–8.3)

## 2014-01-19 LAB — SURGICAL PCR SCREEN
MRSA, PCR: NEGATIVE
STAPHYLOCOCCUS AUREUS: POSITIVE — AB

## 2014-01-19 LAB — TYPE AND SCREEN
ABO/RH(D): B POS
ANTIBODY SCREEN: NEGATIVE

## 2014-01-19 LAB — PROTIME-INR
INR: 0.95 (ref 0.00–1.49)
PROTHROMBIN TIME: 12.8 s (ref 11.6–15.2)

## 2014-01-19 LAB — APTT: APTT: 29 s (ref 24–37)

## 2014-01-19 NOTE — Pre-Procedure Instructions (Signed)
Derrick Kelley  01/19/2014   Your procedure is scheduled on:  Thursday January 27, 2014 at 1042 AM  Report to [facility] Admitting at [phone] AM.  Call this number if you have problems the morning of surgery: [phone]   Remember:   Do not eat food or drink liquids after midnight Wednesday 01/26/14.   Take these medicines the morning of surgery with A SIP OF WATER: Amlodipine,Pain med if needed, Protonix, and Zoloft. Stop multivitamins, and Fish oil 5 days prior to surgery.  Do not wear jewelry.  Do not wear lotions, powders, or perfumes. You may not wear deodorant.   Men may shave face and neck.  Do not bring valuables to the hospital.  [facility] is not responsible                  for any belongings or valuables.               Contacts, dentures or bridgework may not be worn into surgery.  Leave suitcase in the car. After surgery it may be brought to your room.  For patients admitted to the hospital, discharge time is determined by your                treatment team.               Patients discharged the day of surgery will not be allowed to drive  home.    Special Instructions: [place] - Preparing for Surgery  Before surgery, you can play an important role.  Because skin is not sterile, your skin needs to be as free of germs as possible.  You can reduce the number of germs on you skin by washing with CHG (chlorahexidine gluconate) soap before surgery.  CHG is an antiseptic cleaner which kills germs and bonds with the skin to continue killing germs even after washing.  Please DO NOT use if you have an allergy to CHG or antibacterial soaps.  If your skin becomes reddened/irritated stop using the CHG and inform your nurse when you arrive at Short Stay.  Do not shave (including legs and underarms) for at least 48 hours prior to the first CHG shower.  You may shave your face.  Please follow these instructions carefully:   1.  Shower with CHG Soap the night before  surgery and the                                morning of Surgery.  2.  If you choose to wash your hair, wash your hair first as usual with your       normal shampoo.  3.  After you shampoo, rinse your hair and body thoroughly to remove the                      Shampoo.  4.  Use CHG as you would any other liquid soap.  You can apply chg directly       to the skin and wash gently with scrungie or a clean washcloth.  5.  Apply the CHG Soap to your body ONLY FROM THE NECK DOWN.        Do not use on open wounds or open sores.  Avoid contact with your eyes,       ears, mouth and genitals (private parts).  Wash genitals (private parts)  with your normal soap.  6.  Wash thoroughly, paying special attention to the area where your surgery        will be performed.  7.  Thoroughly rinse your body with warm water from the neck down.  8.  DO NOT shower/wash with your normal soap after using and rinsing off       the CHG Soap.  9.  Pat yourself dry with a clean towel.            10.  Wear clean pajamas.            11.  Place clean sheets on your bed the night of your first shower and do not        sleep with pets.  Day of Surgery  Do not apply any lotions/deoderants the morning of surgery.  Please wear clean clothes to the hospital/surgery center.      Please read over the following fact sheets that you were given: Pain Booklet, Coughing and Deep Breathing, Blood Transfusion Information, MRSA Information and Surgical Site Infection Prevention

## 2014-01-20 NOTE — Progress Notes (Addendum)
Anesthesia Chart Review:  Pt is 53 year old male scheduled for L total shoulder arthoplasty on 01/27/2014 with Dr. Tamera Punt.   PMH: HTN, hyperlipidemia, PSA, polycythemia. BMI 29  Preoperative labs reviewed.    No CXR available from PCP. Will obtain DOS.   EKG 05/11/2013: Sinus rhythm, Borderline left axis deviation, Borderline T abnormalities diffuse leads. Prolonged QT interval  Pt has medical clearance from PCP, Dr. Claris Gower, on chart.   If no changes, I anticipate pt can proceed with surgery as scheduled.   Willeen Cass, FNP-BC [facility]/Anesthesiology Phone: [phone] 01/20/2014 1:55 PM

## 2014-01-24 ENCOUNTER — Other Ambulatory Visit: Payer: Self-pay | Admitting: Internal Medicine

## 2014-01-24 ENCOUNTER — Ambulatory Visit: Payer: Self-pay | Admitting: Pain Medicine

## 2014-01-26 ENCOUNTER — Other Ambulatory Visit: Payer: Self-pay

## 2014-01-26 MED ORDER — POVIDONE-IODINE 7.5 % EX SOLN
Freq: Once | CUTANEOUS | Status: DC
  Filled 2014-01-26: qty 118

## 2014-01-26 MED ORDER — IRBESARTAN 150 MG PO TABS: 150.0000 mg | ORAL_TABLET | Freq: Every day | ORAL | Status: DC

## 2014-01-26 MED ORDER — CEFAZOLIN SODIUM-DEXTROSE 2-3 GM-% IV SOLR
2.0000 g | INTRAVENOUS | Status: AC
  Administered 2014-01-27: 2 g via INTRAVENOUS
  Filled 2014-01-26: qty 50

## 2014-01-26 NOTE — Telephone Encounter (Signed)
This has never been filled by you--please advise if okay to refill

## 2014-01-26 NOTE — Telephone Encounter (Signed)
Will refill.

## 2014-01-27 ENCOUNTER — Inpatient Hospital Stay (HOSPITAL_COMMUNITY): Payer: 59

## 2014-01-27 ENCOUNTER — Inpatient Hospital Stay (HOSPITAL_COMMUNITY)
Admission: RE | Admit: 2014-01-27 | Discharge: 2014-01-28 | DRG: 483 | Disposition: A | Discharge status: Home / Self Care | Payer: 59 | Source: Ambulatory Visit | Attending: Orthopedic Surgery | Admitting: Orthopedic Surgery

## 2014-01-27 ENCOUNTER — Other Ambulatory Visit: Payer: Self-pay | Admitting: Internal Medicine

## 2014-01-27 ENCOUNTER — Encounter (HOSPITAL_COMMUNITY): Payer: Self-pay | Admitting: *Deleted

## 2014-01-27 ENCOUNTER — Inpatient Hospital Stay (HOSPITAL_COMMUNITY): Payer: 59 | Admitting: Certified Registered"

## 2014-01-27 ENCOUNTER — Inpatient Hospital Stay (HOSPITAL_COMMUNITY): Payer: 59 | Admitting: Emergency Medicine

## 2014-01-27 ENCOUNTER — Encounter (HOSPITAL_COMMUNITY)
Admission: RE | Disposition: A | Discharge status: Home / Self Care | Payer: Self-pay | Source: Ambulatory Visit | Attending: Orthopedic Surgery

## 2014-01-27 DIAGNOSIS — Z79899 Other long term (current) drug therapy: Secondary | ICD-10-CM

## 2014-01-27 DIAGNOSIS — M19012 Primary osteoarthritis, left shoulder: Secondary | ICD-10-CM | POA: Diagnosis present

## 2014-01-27 DIAGNOSIS — M25512 Pain in left shoulder: Secondary | ICD-10-CM | POA: Diagnosis present

## 2014-01-27 DIAGNOSIS — G8929 Other chronic pain: Secondary | ICD-10-CM | POA: Diagnosis present

## 2014-01-27 DIAGNOSIS — K219 Gastro-esophageal reflux disease without esophagitis: Secondary | ICD-10-CM | POA: Diagnosis present

## 2014-01-27 DIAGNOSIS — E291 Testicular hypofunction: Secondary | ICD-10-CM | POA: Diagnosis present

## 2014-01-27 DIAGNOSIS — G4700 Insomnia, unspecified: Secondary | ICD-10-CM | POA: Diagnosis present

## 2014-01-27 DIAGNOSIS — I10 Essential (primary) hypertension: Secondary | ICD-10-CM | POA: Diagnosis present

## 2014-01-27 DIAGNOSIS — Z8041 Family history of malignant neoplasm of ovary: Secondary | ICD-10-CM | POA: Diagnosis not present

## 2014-01-27 DIAGNOSIS — F329 Major depressive disorder, single episode, unspecified: Secondary | ICD-10-CM | POA: Diagnosis present

## 2014-01-27 DIAGNOSIS — Z8042 Family history of malignant neoplasm of prostate: Secondary | ICD-10-CM | POA: Diagnosis not present

## 2014-01-27 DIAGNOSIS — Z01818 Encounter for other preprocedural examination: Secondary | ICD-10-CM

## 2014-01-27 DIAGNOSIS — M549 Dorsalgia, unspecified: Secondary | ICD-10-CM | POA: Diagnosis present

## 2014-01-27 DIAGNOSIS — G4730 Sleep apnea, unspecified: Secondary | ICD-10-CM | POA: Diagnosis present

## 2014-01-27 DIAGNOSIS — E785 Hyperlipidemia, unspecified: Secondary | ICD-10-CM | POA: Diagnosis present

## 2014-01-27 DIAGNOSIS — Z811 Family history of alcohol abuse and dependence: Secondary | ICD-10-CM

## 2014-01-27 DIAGNOSIS — Z96612 Presence of left artificial shoulder joint: Secondary | ICD-10-CM

## 2014-01-27 DIAGNOSIS — Z96619 Presence of unspecified artificial shoulder joint: Secondary | ICD-10-CM

## 2014-01-27 HISTORY — PX: TOTAL SHOULDER ARTHROPLASTY: SHX126

## 2014-01-27 SURGERY — ARTHROPLASTY, SHOULDER, TOTAL
Anesthesia: Regional | Laterality: Left

## 2014-01-27 MED ORDER — ATORVASTATIN CALCIUM 20 MG PO TABS
20.0000 mg | ORAL_TABLET | Freq: Every day | ORAL | Status: DC
  Administered 2014-01-28: 20 mg via ORAL
  Filled 2014-01-27: qty 1

## 2014-01-27 MED ORDER — DIPHENHYDRAMINE HCL 12.5 MG/5ML PO ELIX: 12.5000 mg | ORAL_SOLUTION | ORAL | Status: DC | PRN

## 2014-01-27 MED ORDER — SERTRALINE HCL 50 MG PO TABS
150.0000 mg | ORAL_TABLET | Freq: Every day | ORAL | Status: DC
  Administered 2014-01-28: 150 mg via ORAL
  Filled 2014-01-27: qty 1

## 2014-01-27 MED ORDER — FLEET ENEMA 7-19 GM/118ML RE ENEM: 1.0000 | ENEMA | Freq: Once | RECTAL | Status: AC | PRN

## 2014-01-27 MED ORDER — HYDROMORPHONE HCL 1 MG/ML IJ SOLN
INTRAMUSCULAR | Status: AC
  Administered 2014-01-27: 0.5 mg via INTRAVENOUS
  Filled 2014-01-27: qty 1

## 2014-01-27 MED ORDER — VECURONIUM BROMIDE 10 MG IV SOLR
INTRAVENOUS | Status: DC | PRN
  Administered 2014-01-27: 2 mg via INTRAVENOUS

## 2014-01-27 MED ORDER — HYDROMORPHONE HCL 1 MG/ML IJ SOLN
0.2500 mg | INTRAMUSCULAR | Status: DC | PRN
  Administered 2014-01-27 (×4): 0.5 mg via INTRAVENOUS

## 2014-01-27 MED ORDER — ALUMINUM HYDROXIDE GEL 320 MG/5ML PO SUSP: 15.0000 mL | ORAL | Status: DC | PRN

## 2014-01-27 MED ORDER — NEOSTIGMINE METHYLSULFATE 10 MG/10ML IV SOLN
INTRAVENOUS | Status: DC | PRN
  Administered 2014-01-27: 4 mg via INTRAVENOUS

## 2014-01-27 MED ORDER — AMLODIPINE BESYLATE 10 MG PO TABS
10.0000 mg | ORAL_TABLET | Freq: Every day | ORAL | Status: DC
  Administered 2014-01-28: 10 mg via ORAL
  Filled 2014-01-27: qty 1

## 2014-01-27 MED ORDER — PHENYLEPHRINE HCL 10 MG/ML IJ SOLN
10.0000 mg | INTRAVENOUS | Status: DC | PRN
  Administered 2014-01-27: 20 ug/min via INTRAVENOUS

## 2014-01-27 MED ORDER — OXYCODONE HCL 5 MG PO TABS
ORAL_TABLET | ORAL | Status: AC
  Administered 2014-01-27: 10 mg via ORAL
  Filled 2014-01-27: qty 10

## 2014-01-27 MED ORDER — ZOLPIDEM TARTRATE 5 MG PO TABS
10.0000 mg | ORAL_TABLET | Freq: Every evening | ORAL | Status: DC | PRN
  Administered 2014-01-27: 10 mg via ORAL
  Filled 2014-01-27: qty 2

## 2014-01-27 MED ORDER — PHENOL 1.4 % MT LIQD: 1.0000 | OROMUCOSAL | Status: DC | PRN

## 2014-01-27 MED ORDER — LIDOCAINE HCL (CARDIAC) 20 MG/ML IV SOLN
INTRAVENOUS | Status: AC
  Filled 2014-01-27: qty 5

## 2014-01-27 MED ORDER — EPHEDRINE SULFATE 50 MG/ML IJ SOLN
INTRAMUSCULAR | Status: AC
  Filled 2014-01-27: qty 1

## 2014-01-27 MED ORDER — DEXAMETHASONE SODIUM PHOSPHATE 4 MG/ML IJ SOLN
INTRAMUSCULAR | Status: DC | PRN
  Administered 2014-01-27: 4 mg via PERINEURAL

## 2014-01-27 MED ORDER — OXYCODONE-ACETAMINOPHEN 5-325 MG PO TABS
ORAL_TABLET | ORAL | Status: AC
  Administered 2014-01-27: 2 via ORAL
  Filled 2014-01-27: qty 2

## 2014-01-27 MED ORDER — FENTANYL CITRATE 0.05 MG/ML IJ SOLN: 50.0000 ug | INTRAMUSCULAR | Status: DC | PRN

## 2014-01-27 MED ORDER — METOCLOPRAMIDE HCL 5 MG/ML IJ SOLN: 5.0000 mg | Freq: Three times a day (TID) | INTRAMUSCULAR | Status: DC | PRN

## 2014-01-27 MED ORDER — METOCLOPRAMIDE HCL 10 MG PO TABS: 5.0000 mg | ORAL_TABLET | Freq: Three times a day (TID) | ORAL | Status: DC | PRN

## 2014-01-27 MED ORDER — ACETAMINOPHEN 325 MG PO TABS: 650.0000 mg | ORAL_TABLET | Freq: Four times a day (QID) | ORAL | Status: DC | PRN

## 2014-01-27 MED ORDER — EPHEDRINE SULFATE 50 MG/ML IJ SOLN
INTRAMUSCULAR | Status: DC | PRN
  Administered 2014-01-27: 15 mg via INTRAVENOUS
  Administered 2014-01-27 (×2): 10 mg via INTRAVENOUS

## 2014-01-27 MED ORDER — LACTATED RINGERS IV SOLN
INTRAVENOUS | Status: DC
  Administered 2014-01-27 (×2): via INTRAVENOUS

## 2014-01-27 MED ORDER — DEXAMETHASONE SODIUM PHOSPHATE 4 MG/ML IJ SOLN
INTRAMUSCULAR | Status: AC
  Filled 2014-01-27: qty 1

## 2014-01-27 MED ORDER — ROCURONIUM BROMIDE 100 MG/10ML IV SOLN
INTRAVENOUS | Status: DC | PRN
  Administered 2014-01-27: 50 mg via INTRAVENOUS

## 2014-01-27 MED ORDER — PROPOFOL 10 MG/ML IV BOLUS
INTRAVENOUS | Status: AC
  Filled 2014-01-27: qty 20

## 2014-01-27 MED ORDER — LIDOCAINE HCL (CARDIAC) 20 MG/ML IV SOLN
INTRAVENOUS | Status: DC | PRN
  Administered 2014-01-27: 70 mg via INTRAVENOUS

## 2014-01-27 MED ORDER — SODIUM CHLORIDE 0.9 % IR SOLN
Status: DC | PRN
  Administered 2014-01-27: 1000 mL

## 2014-01-27 MED ORDER — ONDANSETRON HCL 4 MG/2ML IJ SOLN
INTRAMUSCULAR | Status: DC | PRN
  Administered 2014-01-27: 4 mg via INTRAVENOUS

## 2014-01-27 MED ORDER — ONDANSETRON HCL 4 MG PO TABS: 4.0000 mg | ORAL_TABLET | Freq: Four times a day (QID) | ORAL | Status: DC | PRN

## 2014-01-27 MED ORDER — BISACODYL 10 MG RE SUPP: 10.0000 mg | Freq: Every day | RECTAL | Status: DC | PRN

## 2014-01-27 MED ORDER — PANTOPRAZOLE SODIUM 40 MG PO TBEC
40.0000 mg | DELAYED_RELEASE_TABLET | Freq: Every day | ORAL | Status: DC
  Administered 2014-01-28: 40 mg via ORAL
  Filled 2014-01-27: qty 1

## 2014-01-27 MED ORDER — ONDANSETRON HCL 4 MG/2ML IJ SOLN
INTRAMUSCULAR | Status: AC
  Filled 2014-01-27: qty 2

## 2014-01-27 MED ORDER — ARTIFICIAL TEARS OP OINT
TOPICAL_OINTMENT | OPHTHALMIC | Status: AC
  Filled 2014-01-27: qty 3.5

## 2014-01-27 MED ORDER — MORPHINE SULFATE 2 MG/ML IJ SOLN
1.0000 mg | INTRAMUSCULAR | Status: DC | PRN
  Administered 2014-01-27 – 2014-01-28 (×7): 1 mg via INTRAVENOUS
  Filled 2014-01-27 (×7): qty 1

## 2014-01-27 MED ORDER — METHOCARBAMOL 1000 MG/10ML IJ SOLN
500.0000 mg | Freq: Four times a day (QID) | INTRAVENOUS | Status: DC | PRN
  Filled 2014-01-27: qty 5

## 2014-01-27 MED ORDER — GLYCOPYRROLATE 0.2 MG/ML IJ SOLN
INTRAMUSCULAR | Status: DC | PRN
  Administered 2014-01-27: 0.6 mg via INTRAVENOUS

## 2014-01-27 MED ORDER — MIDAZOLAM HCL 2 MG/2ML IJ SOLN: 1.0000 mg | INTRAMUSCULAR | Status: DC | PRN

## 2014-01-27 MED ORDER — OXYCODONE HCL 5 MG/5ML PO SOLN: 5.0000 mg | Freq: Once | ORAL | Status: DC | PRN

## 2014-01-27 MED ORDER — OXYCODONE HCL 5 MG PO TABS: 5.0000 mg | ORAL_TABLET | Freq: Once | ORAL | Status: DC | PRN

## 2014-01-27 MED ORDER — POLYETHYLENE GLYCOL 3350 17 G PO PACK: 17.0000 g | PACK | Freq: Every day | ORAL | Status: DC | PRN

## 2014-01-27 MED ORDER — MIDAZOLAM HCL 2 MG/2ML IJ SOLN
INTRAMUSCULAR | Status: AC
  Administered 2014-01-27: 2 mg
  Filled 2014-01-27: qty 2

## 2014-01-27 MED ORDER — CEFAZOLIN SODIUM-DEXTROSE 2-3 GM-% IV SOLR
2.0000 g | Freq: Four times a day (QID) | INTRAVENOUS | Status: AC
  Administered 2014-01-27 – 2014-01-28 (×3): 2 g via INTRAVENOUS
  Filled 2014-01-27 (×3): qty 50

## 2014-01-27 MED ORDER — ROPIVACAINE HCL 5 MG/ML IJ SOLN
INTRAMUSCULAR | Status: DC | PRN
  Administered 2014-01-27: 25 mL via PERINEURAL

## 2014-01-27 MED ORDER — TAMSULOSIN HCL 0.4 MG PO CAPS
0.4000 mg | ORAL_CAPSULE | Freq: Every day | ORAL | Status: DC
  Administered 2014-01-28: 0.4 mg via ORAL
  Filled 2014-01-27: qty 1

## 2014-01-27 MED ORDER — FENTANYL CITRATE 0.05 MG/ML IJ SOLN
INTRAMUSCULAR | Status: AC
  Administered 2014-01-27: 100 ug
  Filled 2014-01-27: qty 2

## 2014-01-27 MED ORDER — PROPOFOL 10 MG/ML IV BOLUS
INTRAVENOUS | Status: DC | PRN
  Administered 2014-01-27: 160 mg via INTRAVENOUS

## 2014-01-27 MED ORDER — SODIUM CHLORIDE 0.9 % IJ SOLN
INTRAMUSCULAR | Status: AC
  Filled 2014-01-27: qty 10

## 2014-01-27 MED ORDER — IRBESARTAN 150 MG PO TABS
150.0000 mg | ORAL_TABLET | Freq: Every day | ORAL | Status: DC
  Administered 2014-01-28: 150 mg via ORAL
  Filled 2014-01-27: qty 1

## 2014-01-27 MED ORDER — ASPIRIN EC 325 MG PO TBEC
325.0000 mg | DELAYED_RELEASE_TABLET | Freq: Two times a day (BID) | ORAL | Status: DC
  Administered 2014-01-27 – 2014-01-28 (×2): 325 mg via ORAL
  Filled 2014-01-27 (×4): qty 1

## 2014-01-27 MED ORDER — DOCUSATE SODIUM 100 MG PO CAPS
100.0000 mg | ORAL_CAPSULE | Freq: Two times a day (BID) | ORAL | Status: DC
  Administered 2014-01-27 – 2014-01-28 (×2): 100 mg via ORAL
  Filled 2014-01-27 (×2): qty 1

## 2014-01-27 MED ORDER — METHOCARBAMOL 500 MG PO TABS
500.0000 mg | ORAL_TABLET | Freq: Four times a day (QID) | ORAL | Status: DC | PRN
  Administered 2014-01-27 – 2014-01-28 (×4): 500 mg via ORAL
  Filled 2014-01-27 (×5): qty 1

## 2014-01-27 MED ORDER — SODIUM CHLORIDE 0.9 % IV SOLN
INTRAVENOUS | Status: DC
  Administered 2014-01-27: 17:00:00 via INTRAVENOUS

## 2014-01-27 MED ORDER — MENTHOL 3 MG MT LOZG: 1.0000 | LOZENGE | OROMUCOSAL | Status: DC | PRN

## 2014-01-27 MED ORDER — METHOCARBAMOL 500 MG PO TABS
ORAL_TABLET | ORAL | Status: AC
  Administered 2014-01-27: 500 mg via ORAL
  Filled 2014-01-27: qty 1

## 2014-01-27 MED ORDER — OXYCODONE-ACETAMINOPHEN 5-325 MG PO TABS
1.0000 | ORAL_TABLET | ORAL | Status: DC | PRN
  Administered 2014-01-27 – 2014-01-28 (×6): 2 via ORAL
  Filled 2014-01-27 (×5): qty 2

## 2014-01-27 MED ORDER — ACETAMINOPHEN 650 MG RE SUPP: 650.0000 mg | Freq: Four times a day (QID) | RECTAL | Status: DC | PRN

## 2014-01-27 MED ORDER — FENTANYL CITRATE 0.05 MG/ML IJ SOLN
INTRAMUSCULAR | Status: AC
  Filled 2014-01-27: qty 5

## 2014-01-27 MED ORDER — OXYCODONE HCL 5 MG PO TABS
5.0000 mg | ORAL_TABLET | ORAL | Status: DC | PRN
  Administered 2014-01-27 – 2014-01-28 (×5): 10 mg via ORAL
  Filled 2014-01-27 (×4): qty 2

## 2014-01-27 MED ORDER — ROCURONIUM BROMIDE 50 MG/5ML IV SOLN
INTRAVENOUS | Status: AC
  Filled 2014-01-27: qty 1

## 2014-01-27 MED ORDER — ONDANSETRON HCL 4 MG/2ML IJ SOLN: 4.0000 mg | Freq: Four times a day (QID) | INTRAMUSCULAR | Status: DC | PRN

## 2014-01-27 MED ORDER — ARTIFICIAL TEARS OP OINT
TOPICAL_OINTMENT | OPHTHALMIC | Status: DC | PRN
  Administered 2014-01-27: 1 via OPHTHALMIC

## 2014-01-27 SURGICAL SUPPLY — 68 items
BIT DRILL 5/64X5 DISP (BIT) ×2 IMPLANT
BLADE SAW SAG 73X25 THK (BLADE) ×1
BLADE SAW SGTL 73X25 THK (BLADE) ×1 IMPLANT
BLADE SURG 15 STRL LF DISP TIS (BLADE) ×2 IMPLANT
BLADE SURG 15 STRL SS (BLADE) ×2
CAP SHLDR PARTIAL 2 ×2 IMPLANT
CEMENT BONE DEPUY (Cement) ×2 IMPLANT
CHLORAPREP W/TINT 26ML (MISCELLANEOUS) ×2 IMPLANT
CLSR STERI-STRIP ANTIMIC 1/2X4 (GAUZE/BANDAGES/DRESSINGS) ×2 IMPLANT
COVER MAYO STAND STRL (DRAPES) ×2 IMPLANT
COVER SURGICAL LIGHT HANDLE (MISCELLANEOUS) ×2 IMPLANT
DRAPE IMP U-DRAPE 54X76 (DRAPES) ×2 IMPLANT
DRAPE INCISE IOBAN 66X45 STRL (DRAPES) ×4 IMPLANT
DRAPE ORTHO SPLIT 77X108 STRL (DRAPES) ×2
DRAPE SURG 17X23 STRL (DRAPES) ×2 IMPLANT
DRAPE SURG ORHT 6 SPLT 77X108 (DRAPES) ×2 IMPLANT
DRAPE U-SHAPE 47X51 STRL (DRAPES) ×2 IMPLANT
DRSG AQUACEL AG ADV 3.5X10 (GAUZE/BANDAGES/DRESSINGS) ×2 IMPLANT
ELECT BLADE 4.0 EZ CLEAN MEGAD (MISCELLANEOUS) ×2
ELECT REM PT RETURN 9FT ADLT (ELECTROSURGICAL) ×2
ELECTRODE BLDE 4.0 EZ CLN MEGD (MISCELLANEOUS) ×1 IMPLANT
ELECTRODE REM PT RTRN 9FT ADLT (ELECTROSURGICAL) ×1 IMPLANT
EVACUATOR 1/8 PVC DRAIN (DRAIN) IMPLANT
GLOVE BIO SURGEON STRL SZ7 (GLOVE) ×2 IMPLANT
GLOVE BIO SURGEON STRL SZ7.5 (GLOVE) ×2 IMPLANT
GLOVE BIOGEL PI IND STRL 7.0 (GLOVE) ×1 IMPLANT
GLOVE BIOGEL PI IND STRL 8 (GLOVE) ×1 IMPLANT
GLOVE BIOGEL PI INDICATOR 7.0 (GLOVE) ×1
GLOVE BIOGEL PI INDICATOR 8 (GLOVE) ×1
GOWN STRL REUS W/ TWL LRG LVL3 (GOWN DISPOSABLE) ×2 IMPLANT
GOWN STRL REUS W/ TWL XL LVL3 (GOWN DISPOSABLE) ×2 IMPLANT
GOWN STRL REUS W/TWL LRG LVL3 (GOWN DISPOSABLE) ×2
GOWN STRL REUS W/TWL XL LVL3 (GOWN DISPOSABLE) ×2
HANDPIECE INTERPULSE COAX TIP (DISPOSABLE) ×1
HEMOSTAT SURGICEL 2X14 (HEMOSTASIS) ×2 IMPLANT
HOOD PEEL AWAY FACE SHEILD DIS (HOOD) ×6 IMPLANT
KIT BASIN OR (CUSTOM PROCEDURE TRAY) ×2 IMPLANT
KIT ROOM TURNOVER OR (KITS) ×2 IMPLANT
MANIFOLD NEPTUNE II (INSTRUMENTS) ×2 IMPLANT
NEEDLE HYPO 25GX1X1/2 BEV (NEEDLE) IMPLANT
NEEDLE MAYO TROCAR (NEEDLE) ×2 IMPLANT
NS IRRIG 1000ML POUR BTL (IV SOLUTION) ×2 IMPLANT
PACK SHOULDER (CUSTOM PROCEDURE TRAY) ×2 IMPLANT
PAD ARMBOARD 7.5X6 YLW CONV (MISCELLANEOUS) ×4 IMPLANT
PIN GUIDE 2.5X200 (PIN) ×2 IMPLANT
RETRIEVER SUT HEWSON (MISCELLANEOUS) ×2 IMPLANT
SET HNDPC FAN SPRY TIP SCT (DISPOSABLE) ×1 IMPLANT
SLEEVE SURGEON STRL (DRAPES) ×2 IMPLANT
SLING ARM IMMOBILIZER LRG (SOFTGOODS) ×2 IMPLANT
SLING ARM IMMOBILIZER MED (SOFTGOODS) IMPLANT
SMARTMIX MINI TOWER (MISCELLANEOUS) ×2
SPONGE LAP 18X18 X RAY DECT (DISPOSABLE) IMPLANT
SPONGE LAP 4X18 X RAY DECT (DISPOSABLE) ×4 IMPLANT
STRIP CLOSURE SKIN 1/2X4 (GAUZE/BANDAGES/DRESSINGS) ×2 IMPLANT
SUCTION FRAZIER TIP 10 FR DISP (SUCTIONS) ×2 IMPLANT
SUPPORT WRAP ARM LG (MISCELLANEOUS) ×2 IMPLANT
SUT ETHIBOND NAB CT1 #1 30IN (SUTURE) ×6 IMPLANT
SUT MNCRL AB 4-0 PS2 18 (SUTURE) ×2 IMPLANT
SUT SILK 2 0 TIES 17X18 (SUTURE)
SUT SILK 2-0 18XBRD TIE BLK (SUTURE) IMPLANT
SUT VIC AB 0 CTB1 27 (SUTURE) ×2 IMPLANT
SUT VIC AB 2-0 CT1 27 (SUTURE) ×2
SUT VIC AB 2-0 CT1 TAPERPNT 27 (SUTURE) ×2 IMPLANT
SYR CONTROL 10ML LL (SYRINGE) IMPLANT
TAPE FIBER 2MM 7IN #2 BLUE (SUTURE) ×6 IMPLANT
TOWEL OR 17X24 6PK STRL BLUE (TOWEL DISPOSABLE) ×2 IMPLANT
TOWEL OR 17X26 10 PK STRL BLUE (TOWEL DISPOSABLE) ×2 IMPLANT
TOWER SMARTMIX MINI (MISCELLANEOUS) ×1 IMPLANT

## 2014-01-27 NOTE — Progress Notes (Signed)
CXR not needed per Dr Ermalene Postin and Dr Tamera Punt

## 2014-01-27 NOTE — Discharge Instructions (Signed)
Discharge Instructions after Shoulder hemiarthroplasty   A sling has been provided for you. Remove the sling 5 times each day to perform motion exercises. After the first 48 to 72 hours, discontinue using the sling. You should use the sling as a protective device, if you are in a crowd.  Use ice on the shoulder intermittently over the first 48 hours after surgery.  Pain medication has been prescribed for you.  Use your medication liberally over the first 48 hours, and then begin to taper your use. You may take Extra Strength Tylenol or Tylenol only in place of the pain pills. DO NOT take ANY nonsteroidal anti-inflammatory pain medications: Advil, Motrin, Ibuprofen, Aleve, Naproxen, or Naprosyn. Take one aspirin a day for 2 weeks after surgery, unless you have an aspirin sensitivity/allergy or asthma. Leave your dressing on until your first follow up visit.  You may shower with the dressing.  Hold your arm as if you still have your sling on while you shower. Active reaching and lifting are not permitted. You may use the operative arm for activities of daily living that do not require the operative arm to leave the side of the body, such as eating, drinking, bathing, etc.  Three to 5 times each day you should perform assisted overhead reaching and external rotation (outward turning) exercises with the operative arm. You were taught these exercises prior to discharge. Both exercises should be done with the non-operative arm used as the "therapist arm" while the operative arm remains relaxed. Ten of each exercise should be done three to five times each day.   Overhead reach is helping to lift your stiff arm up as high as it will go. To stretch your overhead reach, lie flat on your back, relax, and grasp the wrist of the tight shoulder with your opposite hand. Using the power in your opposite arm, bring the stiff arm up as far as it is comfortable. Start holding it for ten seconds and then work up to where  you can hold it for a count of 30. Breathe slowly and deeply while the arm is moved. Repeat this stretch ten times, trying to help the ar up a little higher each time.     External rotation is turning the arm out to the side while your elbow stays close to your body. External rotation is best stretched while you are lying on your back. Hold a cane, yardstick, broom handle, or dowel in both hands. Bend both elbows to a right angle. Use steady, gentle force from your normal arm to rotate the hand of the stiff shoulder out away from your body. Continue the rotation as far as it will go comfortably, holding it there for a count of 10. Repeat this exercise ten times.      Please call [phone] during normal business hours or [phone] after hours for any problems. Including the following:  - excessive redness of the incisions - drainage for more than 4 days - fever of more than 101.5 F  *Please note that pain medications will not be refilled after hours or on weekends.

## 2014-01-27 NOTE — Anesthesia Postprocedure Evaluation (Signed)
Anesthesia Post Note  Patient: Derrick Kelley  Procedure(s) Performed: Procedure(s) (LRB): TOTAL SHOULDER ARTHROPLASTY (Left)  Anesthesia type: GA  Patient location: PACU  Post pain: Pain level controlled  Post assessment: Post-op Vital signs reviewed  Last Vitals:  Filed Vitals:   01/27/14 1545  BP: 133/81  Pulse: 84  Temp:   Resp: 14    Post vital signs: Reviewed  Level of consciousness: sedated  Complications: No apparent anesthesia complications

## 2014-01-27 NOTE — Anesthesia Preprocedure Evaluation (Addendum)
Anesthesia Evaluation  Patient identified by MRN, date of birth, ID band Patient awake    Reviewed: Allergy & Precautions, H&P , NPO status , Patient's Chart, lab work & pertinent test results  History of Anesthesia Complications Negative for: history of anesthetic complications  Airway Mallampati: II  TM Distance: >3 FB Neck ROM: Full    Dental  (+) Teeth Intact, Dental Advisory Given   Pulmonary neg shortness of breath, sleep apnea and Continuous Positive Airway Pressure Ventilation , neg COPD breath sounds clear to auscultation        Cardiovascular hypertension, Pt. on medications - angina+ Peripheral Vascular Disease - Past MI, - CHF and - DOE - dysrhythmias - Valvular Problems/MurmursRhythm:Regular     Neuro/Psych  Headaches, PSYCHIATRIC DISORDERS Depression  Neuromuscular disease    GI/Hepatic Neg liver ROS, GERD-  Medicated and Controlled,  Endo/Other  negative endocrine ROS  Renal/GU negative Renal ROS     Musculoskeletal  (+) Arthritis -, Osteoarthritis,    Abdominal   Peds  Hematology negative hematology ROS (+)   Anesthesia Other Findings   Reproductive/Obstetrics                            Anesthesia Physical Anesthesia Plan  ASA: II  Anesthesia Plan: General and Regional   Post-op Pain Management:    Induction: Intravenous  Airway Management Planned: Oral ETT  Additional Equipment: None  Intra-op Plan:   Post-operative Plan: Extubation in OR  Informed Consent: I have reviewed the patients History and Physical, chart, labs and discussed the procedure including the risks, benefits and alternatives for the proposed anesthesia with the patient or authorized representative who has indicated his/her understanding and acceptance.   Dental advisory given  Plan Discussed with: CRNA  Anesthesia Plan Comments:         Anesthesia Quick Evaluation

## 2014-01-27 NOTE — Plan of Care (Signed)
Problem: Consults Goal: Diagnosis - Shoulder Surgery Total Shoulder Arthroplasty     

## 2014-01-27 NOTE — Op Note (Signed)
Procedure(s): TOTAL SHOULDER ARTHROPLASTY Procedure Note  Derrick Kelley male 53 y.o. 01/27/2014  Procedure(s) and Anesthesia Type:    * LEFT TOTAL SHOULDER ARTHROPLASTY - General  Surgeon(s) and Role:    * Nita Sells, MD - Primary   Indications:  53 y.o. male  With severe left shoulder arthritis. Pain and dysfunction interfered with quality of life and nonoperative treatment with activity modification, NSAIDS, arthroscopic debridement and injections failed.     Surgeon: Nita Sells   Assistants: Jeanmarie Hubert PA-C [facility] was present and scrubbed throughout the procedure and was essential in positioning, retraction, exposure, and closure)  Anesthesia: General endotracheal anesthesia with preoperative interscalene block given by the attending anesthesiologist    Procedure Detail  TOTAL SHOULDER ARTHROPLASTY  Findings: Tornier size 4C press-fit stem with a 50 x 16 head, low eccentric. Complete anterior capsulectomy was carried out with resection of degenerative labrum and release of anterior capsule. Glenoid had some articular cartilage remaining. Shoulder was extremely tight in exposure was difficult. The decision was made given his young age, glenoid articular cartilage, activity level, and difficulty with exposure to perform a hemiarthroplasty, rather than a total shoulder arthroplasty. A lesser tuberosity osteotomy was performed and repaired at the conclusion of the procedure. Preoperative external rotation was limited to about 5, postoperatively I could bring him to 25 comfortably.  Estimated Blood Loss:  200 mL         Drains: 1 medium hemovac  Blood Given: none          Specimens: none        Complications:  * No complications entered in OR log *         Disposition: PACU - hemodynamically stable.         Condition: stable    Procedure:   The patient was identified in the preoperative holding area where I personally marked  the operative extremity after verifying with the patient and consent. He  was taken to the operating room where He was transferred to the   operative table.  The patient received an interscalene block in   the holding area by the attending anesthesiologist.  General anesthesia was induced   in the operating room without complication.  The patient did receive IV  Ancef prior to the commencement of the procedure.  The patient was   placed in the beach-chair position with the back raised about 30   degrees.  The nonoperative extremity and head and neck were carefully   positioned and padded protecting against neurovascular compromise.  The   left upper extremity was then prepped and draped in the standard sterile   fashion.    The appropriate operative time-out was performed with   Anesthesia, the perioperative staff, as well as myself and we all agreed   that the left side was the correct operative site.  An approximately   10 cm incision was made from the tip of the coracoid to the center point of the   humerus at the level of the axilla.  Dissection was carried down sharply   through subcutaneous tissues and cephalic vein was identified and taken   laterally with the deltoid.  The pectoralis major was taken medially.  The   upper 1 cm of the pectoralis major was released from its attachment on   the humerus.  The clavipectoral fascia was incised just lateral to the   conjoined tendon.  This incision was carried up to but not into the  coracoacromial ligament.  Digital palpation was used to prove   integrity of the axillary nerve which was protected throughout the   procedure.  Musculocutaneous nerve was not palpated in the operative   field.  Conjoined tendon was then retracted gently medially and the   deltoid laterally.  Anterior circumflex humeral vessels were clamped and   coagulated.  The soft tissues overlying the biceps was incised and this   incision was carried across the  transverse humeral ligament to the base   of the coracoid.  The biceps was tenodesed to the soft tissue just above   pectoralis major and the remaining portion of the biceps superiorly was   excised.  An osteotomy was performed at the lesser tuberosity and the   subscapularis was freed from the underlying capsule.  Capsule was then   released all the way down to the 6 o'clock position of the humeral head.   The humeral head was then delivered with simultaneous adduction,   extension and external rotation.  All humeral osteophytes were removed   and the anatomic neck of the humerus was marked and cut free hand at   approximately 25 degrees retroversion within about 3 mm of the cuff   reflection posteriorly.  The head size was estimated to be a 50 medium   offset.  At that point, the humeral head was retracted posteriorly with   a Fukuda retractor and the anterior-inferior capsule was excised.   Remaining portion of the capsule was released at the base of the   coracoid.  The remaining biceps anchor and the entire anterior-inferior   labrum was excised.  The posterior labrum was also excised but the   posterior capsule was not released.  After all these extensive releases it was still extremely difficult to obtain adequate exposure of the glenoid for resurfacing. I took all the retractors out and released another 8 mm of his pectoralis major as that seemed to be holding up exposure. He is a very muscular guy. Even after this, exposure was better but still unable to confidently exposes glenoid without risking damage to the head cut or other structures. At this point I felt given his young age, activity level, relatively preserved glenoid cartilage, and the difficulty with exposure that he would be best served with a hemiarthroplasty.  The proximal humerus was then again exposed .    The entry reamer was used followed by the sounders and the punch. The broaches were then used up to a for which was felt  heavy appropriate press-fit. Calcar planer was used. The trial head was placed.  With the trial implantation of the component, there was   approximately 50% posterior translation with immediate snap back to the   anatomic position.  Soft tissue tension felt appropriate.With forward elevation, there was no tendency   towards posterior subluxation.   The trial was removed and the final implant was prepared on a back table.  The trial was removed and the final implant was prepared on a back table.   Small holes were drilled on both sides of the lesser tuberosity osteotomy, through which 3 Fibertapes were passed. The implant was then placed through the loop of all 3 Fibertapes and impacted with an excellent press-fit. This achieved excellent anatomic reconstruction of the proximal humerus.  The joint was then copiously irrigated with pulse lavage.  The subscapularis and   lesser tuberosity osteotomy were then repaired using the 3 Fibertapes previously passed.   One #1  Ethibond was placed at the rotator interval just above   the lesser tuberosity. Copious irrigation was used.   Skin was closed with 2-0 Vicryl sutures in the deep dermal layer and 4-0 Monocryl in a subcuticular  running fashion.  Sterile dressings were then applied including Aquacel.  The patient was placed in a sling and allowed to awaken from general anesthesia and taken to the recovery room in stable condition.      POSTOPERATIVE PLAN:  Early passive range of motion will be allowed with the goal of 20 degrees external rotation and 120 degrees forward elevation.  No internal rotation at this time.  No active motion of the arm until the lesser tuberosity heals.  The patient will likely be kept in the hospital for 1-2 days and then discharged home.

## 2014-01-27 NOTE — H&P (Signed)
Derrick Kelley is an 53 y.o. male.   Chief Complaint: L shoulder pain and dysfunction. HPI: 53 yo with severe L shoulder arthritis, failed extensive conservative management with medications, activity modification, injections and arthroscopic debridement.  Past Medical History  Diagnosis Date  . BACK PAIN, CHRONIC 05/11/2009    bone on bone;hx of herniated disc  . Flushing 05/11/2009  . HYPERLIPIDEMIA     takes Lipitor daily  . HYPOGONADISM 05/11/2009    takes Testosterone every 14days  . INSOMNIA-SLEEP DISORDER-UNSPEC 10/10/2009    takes Ambien nightly  . POLYCYTHEMIA 05/11/2009  . SCIATICA, RIGHT 09/20/2009  . SINUSITIS- ACUTE-NOS 10/10/2009  . URI 05/11/2009  . Arthritis   . Chronic back pain     hx buldging disc;  . Hemorrhoids     hx of  . HYPERTENSION     takes Avapro,Amlodipine,and Lisinopril daily  . Headache(784.0) 10/10/2009    cluster;last one about a yr ago  . Joint pain   . Joint pain   . GERD 05/11/2009    but doesn't require meds  . DEPRESSION     takes Zoloft daily  . Sleep apnea     use CPAP    Past Surgical History  Procedure Laterality Date  . Left clavicle fracture    . Hx eye surgury 2001  many yrs ago    pterygium-both eyes  . Hx back surgury lumbar fusion  2007  . Hx left shoulder surgury  2002 and August 10, 2009    Dr. Tamera Punt, ortho  . Mouth surgery      wisdom teeth extracted  . Back surgery  2013  . Left shoulder surgery    . Anterior lumbar fusion  01/02/2012    Procedure: ANTERIOR LUMBAR FUSION 1 LEVEL;  Surgeon: Sinclair Ship, MD;  Location: [facility];  Service: Orthopedics;  Laterality: Bilateral;  Anterior lumbar interbody fusion, lumbar 4-5 with OP-1 and instrumentation.  . Abdominal exposure  01/02/2012    Procedure: ABDOMINAL EXPOSURE;  Surgeon: Angelia Mould, MD;  Location: [facility];  Service: Vascular;  Laterality: Bilateral;  . Eye surgery      Family History  Problem Relation Age of Onset  . Alcohol abuse Father   .  Cancer Father     prostate  . Alcohol abuse Maternal Aunt   . Alcohol abuse Maternal Uncle   . Cancer Paternal Uncle     prostate  . Cancer Maternal Grandmother     ovarian cancer  . Alcohol abuse Other   . Anesthesia problems Neg Hx   . Hypotension Neg Hx   . Malignant hyperthermia Neg Hx   . Pseudochol deficiency Neg Hx    Social History:  reports that he has never smoked. He has never used smokeless tobacco. He reports that he drinks alcohol. He reports that he does not use illicit drugs.  Allergies:  Allergies  Allergen Reactions  . Bupropion Hcl     REACTION: nightmares  . Codeine Hives    Medications Prior to Admission  Medication Sig Dispense Refill  . amLODipine (NORVASC) 10 MG tablet TAKE 1 TABLET BY MOUTH DAILY 90 tablet 1  . atorvastatin (LIPITOR) 20 MG tablet Take 1 tablet (20 mg total) by mouth daily. 90 tablet 1  . HYDROcodone-acetaminophen (NORCO/VICODIN) 5-325 MG per tablet Take 2 tablets by mouth every 6 (six) hours as needed for moderate pain.    Marland Kitchen irbesartan (AVAPRO) 150 MG tablet Take 1 tablet (150 mg total) by mouth daily. [place]  tablet 5  . Multiple Vitamins-Minerals (MULTIVITAMIN WITH MINERALS) tablet Take 1 tablet by mouth daily.    . Omega-3 Fatty Acids (FISH OIL) 300 MG CAPS Take 1 capsule by mouth 2 (two) times daily.    . pantoprazole (PROTONIX) 40 MG tablet Take 40 mg by mouth daily.    . sertraline (ZOLOFT) 100 MG tablet Take 1.5 tablets (150 mg total) by mouth daily. 45 tablet 5  . tamsulosin (FLOMAX) 0.4 MG CAPS capsule Take 0.4 mg by mouth daily.    Marland Kitchen testosterone cypionate (DEPOTESTOTERONE CYPIONATE) 200 MG/ML injection Inject 1 mL (200 mg total) into the muscle every 14 (fourteen) days. 1 cc biweekly IM (self Injected)  Last injection was on 04/12/11 10 mL 5  . traZODone (DESYREL) 100 MG tablet TAKE ONE TABLET BY MOUTH EVERY NIGHT AT BEDTIME (Patient taking differently: TAKE ONE TABLET BY MOUTH EVERY NIGHT AT BEDTIME AS NEEDED) 30 tablet 0  .  zolpidem (AMBIEN) 10 MG tablet Take 10 mg by mouth at bedtime as needed for sleep.    . cyclobenzaprine (FLEXERIL) 10 MG tablet Take 10 mg by mouth 2 (two) times daily as needed.       No results found for this or any previous visit (from the past 48 hour(s)). No results found.  Review of Systems  All other systems reviewed and are negative.   Blood pressure 143/87, pulse 64, temperature 97.7 F (36.5 C), temperature source Oral, resp. rate 11, height 5\' 8"  (1.727 m), weight 86.75 kg (191 lb 4 oz), SpO2 99 %. Physical Exam  Constitutional: He is oriented to person, place, and time. He appears well-developed and well-nourished.  HENT:  Head: Atraumatic.  Eyes: EOM are normal.  Cardiovascular: Intact distal pulses.   Respiratory: Effort normal.  Musculoskeletal:  L shoulder pain and crepitance with limited ROM. NVID.  Neurological: He is alert and oriented to person, place, and time.  Skin: Skin is warm and dry.  Psychiatric: He has a normal mood and affect.     Assessment/Plan L shoulder severe arthritis Plan L TSA Risks / benefits of surgery discussed Consent on chart  NPO for OR Preop antibiotics   Alfredo Collymore Joncarlo 01/27/2014, 10:22 AM

## 2014-01-27 NOTE — Transfer of Care (Signed)
Immediate Anesthesia Transfer of Care Note  Patient: Derrick Kelley  Procedure(s) Performed: Procedure(s): TOTAL SHOULDER ARTHROPLASTY (Left)  Patient Location: PACU  Anesthesia Type:General  Level of Consciousness: awake, alert  and oriented  Airway & Oxygen Therapy: Patient Spontanous Breathing and Patient connected to nasal cannula oxygen  Post-op Assessment: Report given to PACU RN  Post vital signs: Reviewed and stable  Complications: No apparent anesthesia complications

## 2014-01-27 NOTE — Anesthesia Procedure Notes (Addendum)
Anesthesia Regional Block:  Interscalene brachial plexus block  Pre-Anesthetic Checklist: ,, timeout performed, Correct Patient, Correct Site, Correct Laterality, Correct Procedure, Correct Position, site marked, Risks and benefits discussed, Surgical consent, Pre-op evaluation  Laterality: Right  Prep: chloraprep       Needles:  Injection technique: Single-shot  Needle Type: Echogenic Stimulator Needle          Additional Needles:  Procedures: ultrasound guided (picture in chart) and nerve stimulator Interscalene brachial plexus block  Nerve Stimulator or Paresthesia:  Response: Deltoid , 0.5 mA,   Additional Responses:   Narrative:   Performed by: Personally  Anesthesiologist: Lyndle Herrlich   Procedure Name: Intubation Date/Time: 01/27/2014 11:14 AM Performed by: Sampson Si E Pre-anesthesia Checklist: Patient identified, Emergency Drugs available, Suction available, Patient being monitored and Timeout performed Patient Re-evaluated:Patient Re-evaluated prior to inductionOxygen Delivery Method: Circle system utilized Preoxygenation: Pre-oxygenation with 100% oxygen Intubation Type: IV induction Ventilation: Mask ventilation without difficulty Laryngoscope Size: Mac and 3 Grade View: Grade II Tube type: Oral Tube size: 7.5 mm Number of attempts: 1 Airway Equipment and Method: Stylet Placement Confirmation: ETT inserted through vocal cords under direct vision,  positive ETCO2 and breath sounds checked- equal and bilateral Secured at: 23 cm Tube secured with: Tape Dental Injury: Teeth and Oropharynx as per pre-operative assessment

## 2014-01-28 LAB — CBC
HEMATOCRIT: 37.2 % — AB (ref 39.0–52.0)
Hemoglobin: 13.2 g/dL (ref 13.0–17.0)
MCH: 32.8 pg (ref 26.0–34.0)
MCHC: 35.5 g/dL (ref 30.0–36.0)
MCV: 92.3 fL (ref 78.0–100.0)
Platelets: 155 10*3/uL (ref 150–400)
RBC: 4.03 MIL/uL — ABNORMAL LOW (ref 4.22–5.81)
RDW: 14.1 % (ref 11.5–15.5)
WBC: 8.9 10*3/uL (ref 4.0–10.5)

## 2014-01-28 MED ORDER — DOCUSATE SODIUM 100 MG PO CAPS: 100.0000 mg | ORAL_CAPSULE | Freq: Three times a day (TID) | ORAL | Status: DC | PRN

## 2014-01-28 MED ORDER — OXYCODONE-ACETAMINOPHEN 5-325 MG PO TABS: 1.0000 | ORAL_TABLET | ORAL | Status: DC | PRN

## 2014-01-28 NOTE — Discharge Summary (Signed)
Patient ID: Derrick Kelley MRN: [id number] DOB/AGE: 1960/03/07 53 y.o.  Admit date: 01/27/2014 Discharge date: 01/28/2014  Admission Diagnoses:  Active Problems:   S/P shoulder hemiarthroplasty   Discharge Diagnoses:  Same  Past Medical History  Diagnosis Date  . BACK PAIN, CHRONIC 05/11/2009    bone on bone;hx of herniated disc  . Flushing 05/11/2009  . HYPERLIPIDEMIA     takes Lipitor daily  . HYPOGONADISM 05/11/2009    takes Testosterone every 14days  . INSOMNIA-SLEEP DISORDER-UNSPEC 10/10/2009    takes Ambien nightly  . POLYCYTHEMIA 05/11/2009  . SCIATICA, RIGHT 09/20/2009  . SINUSITIS- ACUTE-NOS 10/10/2009  . URI 05/11/2009  . Arthritis   . Chronic back pain     hx buldging disc;  . Hemorrhoids     hx of  . HYPERTENSION     takes Avapro,Amlodipine,and Lisinopril daily  . Headache(784.0) 10/10/2009    cluster;last one about a yr ago  . Joint pain   . Joint pain   . GERD 05/11/2009    but doesn't require meds  . DEPRESSION     takes Zoloft daily  . Sleep apnea     use CPAP    Surgeries: Procedure(s): TOTAL SHOULDER ARTHROPLASTY on 01/27/2014   Consultants:    Discharged Condition: Improved  Hospital Course: Derrick Kelley is an 53 y.o. male who was admitted 01/27/2014 for operative treatment of left shoulder osteoarthritis. Patient has severe unremitting pain that affects sleep, daily activities, and work/hobbies. After pre-op clearance the patient was taken to the operating room on 01/27/2014 and underwent  Procedure(s): LEFT SHOULDER HEMIARTHROPLASTY.    Patient was given perioperative antibiotics: Anti-infectives    Start     Dose/Rate Route Frequency Ordered Stop   01/27/14 1800  ceFAZolin (ANCEF) IVPB 2 g/50 mL premix     2 g100 mL/hr over 30 Minutes Intravenous Every 6 hours 01/27/14 1633 01/28/14 0629   01/27/14 0600  ceFAZolin (ANCEF) IVPB 2 g/50 mL premix     2 g100 mL/hr over 30 Minutes Intravenous On call to O.R. 01/26/14 1343 01/27/14 1116        Patient was given sequential compression devices, early ambulation, and ASA 325mg  BID to prevent DVT.  Patient benefited maximally from hospital stay and there were no complications.    Recent vital signs: Patient Vitals for the past 24 hrs:  BP Temp Temp src Pulse Resp SpO2  01/28/14 0504 129/75 mmHg 99.1 F (37.3 C) Oral 75 16 96 %  01/28/14 0042 133/81 mmHg 98.9 F (37.2 C) Oral 79 16 96 %  01/27/14 2035 124/80 mmHg 98.1 F (36.7 C) Oral 78 18 95 %  01/27/14 1627 129/82 mmHg 98.2 F (36.8 C) - 85 20 94 %  01/27/14 1600 132/80 mmHg 98 F (36.7 C) - 90 (!) 22 97 %  01/27/14 1545 133/81 mmHg - - 84 14 97 %  01/27/14 1530 (!) 123/97 mmHg - - 84 14 96 %  01/27/14 1515 139/84 mmHg - - 85 19 99 %  01/27/14 1500 - - - 88 13 94 %  01/27/14 1445 (!) 148/84 mmHg - - 79 15 96 %  01/27/14 1430 (!) 145/85 mmHg - - 83 (!) 25 97 %  01/27/14 1415 138/90 mmHg - - 83 18 97 %  01/27/14 1400 (!) 151/85 mmHg - - 79 (!) 22 97 %  01/27/14 1345 (!) 139/105 mmHg - - 80 18 98 %  01/27/14 1337 139/86 mmHg 98 F (36.7 C) - 80 11  97 %     Recent laboratory studies:  Recent Labs  01/28/14 0500  WBC 8.9  HGB 13.2  HCT 37.2*  PLT 155     Discharge Medications:     Medication List    STOP taking these medications        HYDROcodone-acetaminophen 10-325 MG per tablet  Commonly known as:  NORCO     HYDROcodone-acetaminophen 5-325 MG per tablet  Commonly known as:  NORCO/VICODIN      TAKE these medications        amLODipine 10 MG tablet  Commonly known as:  NORVASC  TAKE 1 TABLET BY MOUTH DAILY     atorvastatin 20 MG tablet  Commonly known as:  LIPITOR  Take 1 tablet (20 mg total) by mouth daily.     cyclobenzaprine 10 MG tablet  Commonly known as:  FLEXERIL  Take 10 mg by mouth 2 (two) times daily as needed.     docusate sodium 100 MG capsule  Commonly known as:  COLACE  Take 1 capsule (100 mg total) by mouth 3 (three) times daily as needed.     Fish Oil 300 MG Caps   Take 1 capsule by mouth 2 (two) times daily.     gabapentin 800 MG tablet  Commonly known as:  NEURONTIN  Take by mouth.     irbesartan 150 MG tablet  Commonly known as:  AVAPRO  Take 1 tablet (150 mg total) by mouth daily.     multivitamin with minerals tablet  Take 1 tablet by mouth daily.     mupirocin ointment 2 %  Commonly known as:  BACTROBAN     oxyCODONE-acetaminophen 5-325 MG per tablet  Commonly known as:  ROXICET  Take 1-2 tablets by mouth every 4 (four) hours as needed for severe pain.     pantoprazole 40 MG tablet  Commonly known as:  PROTONIX  Take 40 mg by mouth daily.     sertraline 100 MG tablet  Commonly known as:  ZOLOFT  Take 1.5 tablets (150 mg total) by mouth daily.     tamsulosin 0.4 MG Caps capsule  Commonly known as:  FLOMAX  Take 0.4 mg by mouth daily.     testosterone cypionate 200 MG/ML injection  Commonly known as:  DEPOTESTOTERONE CYPIONATE  - Inject 1 mL (200 mg total) into the muscle every 14 (fourteen) days. 1 cc biweekly IM (self Injected)   - Last injection was on 04/12/11     tiZANidine 4 MG tablet  Commonly known as:  ZANAFLEX     traZODone 100 MG tablet  Commonly known as:  DESYREL  TAKE ONE TABLET BY MOUTH AT BEDTIME     zolpidem 10 MG tablet  Commonly known as:  AMBIEN  Take 10 mg by mouth at bedtime as needed for sleep.        Diagnostic Studies: Dg Shoulder Left Port  01/27/2014   CLINICAL DATA:  Postop from left shoulder hemiarthroplasty.  EXAM: LEFT SHOULDER - 1 VIEW  COMPARISON:  None.  FINDINGS: Left shoulder hemiarthroplasty seen in expected position. No evidence of fracture or dislocation. Mild acromioclavicular degenerative changes noted.  IMPRESSION: Expected postop appearance of left shoulder hemiarthroplasty.   Electronically Signed   By: Earle Gell M.D.   On: 01/27/2014 15:08    Disposition: 01-Home or Self Care      Discharge Instructions    Call MD / Call 911    Complete by:  As directed  If you  experience chest pain or shortness of breath, CALL 911 and be transported to the hospital emergency room.  If you develope a fever above 101 F, pus (white drainage) or increased drainage or redness at the wound, or calf pain, call your surgeon's office.     Constipation Prevention    Complete by:  As directed   Drink plenty of fluids.  Prune juice may be helpful.  You may use a stool softener, such as Colace (over the counter) 100 mg twice a day.  Use MiraLax (over the counter) for constipation as needed.     Diet - low sodium heart healthy    Complete by:  As directed      Increase activity slowly as tolerated    Complete by:  As directed            Follow-up Information    Follow up with Nita Sells, MD. Schedule an appointment as soon as possible for a visit in 2 weeks.   Specialty:  Orthopedic Surgery   Contact information:   [address] [place] [address] [phone]        Signed: Grier Mitts 01/28/2014, 1:16 PM

## 2014-01-28 NOTE — Progress Notes (Signed)
Patient was discharged home with wife via wheelchair. He had his AVS and prescriptions in hand.

## 2014-01-28 NOTE — Progress Notes (Signed)
   PATIENT ID: Derrick Kelley   1 Day Post-Op Procedure(s) (LRB): TOTAL SHOULDER ARTHROPLASTY (Left)  Subjective: Doing well this am. Block wore off around 6 am. Some left shoulder soreness. No complaints or concerns.   Objective:  Filed Vitals:   01/28/14 0504  BP: 129/75  Pulse: 75  Temp: 99.1 F (37.3 C)  Resp: 16     AWake, alert, orientated L UE dressing c/d/i Wiggles fingers, distally NVI Deltoid firing  Labs:   Recent Labs  01/28/14 0500  HGB 13.2   Recent Labs  01/28/14 0500  WBC 8.9  RBC 4.03*  HCT 37.2*  PLT 155  No results for input(s): NA, K, CL, CO2, BUN, CREATININE, GLUCOSE, CALCIUM in the last 72 hours.  Assessment and Plan:  1 day s/ p left shoulder hemiarthroplasty Continue current pain mgmt, ween off IV to po percocet Okay to d/c home when cleared by OT and pain controlled, likely today Script for percocet in chart Fu Dr. Tamera Punt in 2 weeks  VTE proph: ASA 325mg  BID, SCDs

## 2014-01-28 NOTE — Progress Notes (Signed)
Occupational Therapy Evaluation and Discharge Patient Details Name: Derrick Kelley MRN: [id number] DOB: [birth year] Today's Date: 01/28/2014    History of Present Illness Derrick Kelley is a 53 y.o. Male s/p L TSA on 01/27/14.    Clinical Impression   PTA pt lived at home and was independent with ADLs. Pt currently limited by pain and decreased ROM which impair his independence with ADLs. Pt and wife educated on shoulder precautions and incorporating into ADLs as well as therapeutic exercises. All education and training completed.     Follow Up Recommendations  No OT follow up;Supervision/Assistance - 24 hour    Equipment Recommendations  None recommended by OT    Recommendations for Other Services       Precautions / Restrictions Precautions Precautions: Shoulder Type of Shoulder Precautions: Chandler protocol (AAROM for FF to 120* and ER to 20*; E,W,H) Shoulder Interventions: Shoulder sling/immobilizer;At all times;Off for dressing/bathing/exercises Precaution Booklet Issued: Yes (comment) Precaution Comments: Educated pt on shoulder precautions and incorporating into ADLs.  Required Braces or Orthoses: Sling Restrictions Weight Bearing Restrictions: Yes LUE Weight Bearing: Non weight bearing      Mobility Bed Mobility Overal bed mobility: Needs Assistance Bed Mobility: Supine to Sit     Supine to sit: Min guard     General bed mobility comments: Pt left bed on Right side and reports he has an elevating bed.  Transfers Overall transfer level: Needs assistance Equipment used: None Transfers: Sit to/from Stand Sit to Stand: Supervision         General transfer comment: Supervision for safety due to first time OOB.          ADL Overall ADL's : Needs assistance/impaired Eating/Feeding: Set up;Sitting   Grooming: Set up;Standing   Upper Body Bathing: Minimal assitance;Sitting   Lower Body Bathing: Supervison/ safety;Set up;Sit to/from stand    Upper Body Dressing : Maximal assistance;Sitting Upper Body Dressing Details (indicate cue type and reason): pt requires assist for donning shirt and sling Lower Body Dressing: Minimal assistance;Sit to/from stand   Toilet Transfer: Supervision/safety;Ambulation   Toileting- Clothing Manipulation and Hygiene: Supervision/safety;Sit to/from stand       Functional mobility during ADLs: Supervision/safety General ADL Comments: Educated pt on incorporating shoulder precautions into ADLs and wife present for education and training to assist pt.      Vision  Pt wears glasses and reports no change from baseline.                    Perception Perception Perception Tested?: No   Praxis Praxis Praxis tested?: Within functional limits    Pertinent Vitals/Pain Pain Assessment: 0-10 Pain Score: 7  Pain Location: L shoulder after exercises Pain Descriptors / Indicators: Aching;Sore;Tender Pain Intervention(s): Limited activity within patient's tolerance;Monitored during session;Repositioned;Patient requesting pain meds-RN notified;Ice applied;RN gave pain meds during session     Hand Dominance Right   Extremity/Trunk Assessment Upper Extremity Assessment Upper Extremity Assessment: LUE deficits/detail LUE Deficits / Details: L TSA (Chandler Protocol) LUE Coordination: decreased gross motor   Lower Extremity Assessment Lower Extremity Assessment: Overall WFL for tasks assessed   Cervical / Trunk Assessment Cervical / Trunk Assessment: Normal   Communication Communication Communication: No difficulties   Cognition Arousal/Alertness: Awake/alert Behavior During Therapy: WFL for tasks assessed/performed Overall Cognitive Status: Within Functional Limits for tasks assessed                        Exercises Exercises: Shoulder  01/28/14 0900  Shoulder Exercises  Shoulder Flexion AAROM;Left;10 reps;Supine (therapist assisted)  Shoulder External Rotation  AAROM;Left;10 reps;Supine (using dowel)  Elbow Flexion AROM;Left;10 reps;Supine  Elbow Extension AROM;Left;10 reps;Supine  Wrist Flexion AROM;Left;10 reps;Supine  Wrist Extension AROM;Left;10 reps;Supine  Digit Composite Flexion AROM;Left;10 reps;Supine  Composite Extension AROM;Left;10 reps;Supine      Shoulder Instructions Shoulder Instructions Donning/doffing shirt without moving shoulder: Moderate assistance;Caregiver independent with task Method for sponge bathing under operated UE: Supervision/safety Donning/doffing sling/immobilizer: Maximal assistance;Caregiver independent with task Correct positioning of sling/immobilizer: Supervision/safety ROM for elbow, wrist and digits of operated UE: Independent Sling wearing schedule (on at all times/off for ADL's): Independent Proper positioning of operated UE when showering: Independent Positioning of UE while sleeping: [place] expects to be discharged to:: Private residence Living Arrangements: Spouse/significant other Available Help at Discharge: Family;Available 24 hours/day Type of Home: House Home Access: Stairs to enter Entrance Stairs-Number of Steps: 2   Home Layout: One level     Bathroom Shower/Tub: Walk-in shower                    Prior Functioning/Environment Level of Independence: Independent             OT Diagnosis: Acute pain    End of Session Equipment Utilized During Treatment: Other (comment) (sling) Nurse Communication: Patient requests pain meds  Activity Tolerance: Patient tolerated treatment well Patient left: in chair;with call bell/phone within reach;with family/visitor present   Time: 1027-2536 OT Time Calculation (min): 39 min Charges:  OT General Charges $OT Visit: 1 Procedure OT Evaluation $Initial OT Evaluation Tier I: 1 Procedure OT Treatments $Self Care/Home Management : 8-22 mins $Therapeutic Exercise: 8-22 mins  Derrick Kelley 01/28/2014, 9:43 AM   Derrick Kelley, OTR/L Occupational Therapist [phone] (pager)

## 2014-01-28 NOTE — Progress Notes (Signed)
Utilization review completed.  

## 2014-02-01 ENCOUNTER — Encounter (HOSPITAL_COMMUNITY): Payer: Self-pay | Admitting: Orthopedic Surgery

## 2014-02-22 ENCOUNTER — Ambulatory Visit: Payer: Self-pay | Admitting: Pain Medicine

## 2014-03-18 ENCOUNTER — Encounter (HOSPITAL_COMMUNITY): Payer: Self-pay | Admitting: Orthopedic Surgery

## 2014-03-22 ENCOUNTER — Encounter: Payer: Self-pay | Admitting: Orthopedic Surgery

## 2014-03-24 ENCOUNTER — Ambulatory Visit: Payer: Self-pay | Admitting: Pain Medicine

## 2014-04-06 ENCOUNTER — Ambulatory Visit: Payer: Self-pay | Admitting: Pain Medicine

## 2014-04-12 ENCOUNTER — Encounter
Admit: 2014-04-12 | Disposition: A | Discharge status: Home / Self Care | Payer: Self-pay | Attending: Orthopedic Surgery | Admitting: Orthopedic Surgery

## 2014-04-21 ENCOUNTER — Ambulatory Visit: Payer: Self-pay | Admitting: Pain Medicine

## 2014-05-09 ENCOUNTER — Ambulatory Visit: Payer: Self-pay | Admitting: Pain Medicine

## 2014-05-13 ENCOUNTER — Encounter
Admit: 2014-05-13 | Disposition: A | Discharge status: Home / Self Care | Payer: Self-pay | Attending: Orthopedic Surgery | Admitting: Orthopedic Surgery

## 2014-05-14 ENCOUNTER — Emergency Department
Admit: 2014-05-14 | Disposition: A | Discharge status: Home / Self Care | Payer: Self-pay | Admitting: Physician Assistant

## 2014-05-23 ENCOUNTER — Ambulatory Visit
Admit: 2014-05-23 | Disposition: A | Discharge status: Home / Self Care | Payer: Self-pay | Attending: Pain Medicine | Admitting: Pain Medicine

## 2014-06-17 ENCOUNTER — Other Ambulatory Visit: Payer: Self-pay | Admitting: Internal Medicine

## 2014-06-21 ENCOUNTER — Encounter: Payer: Self-pay | Admitting: Pain Medicine

## 2014-06-21 ENCOUNTER — Ambulatory Visit: Payer: BLUE CROSS/BLUE SHIELD | Attending: Pain Medicine | Admitting: Pain Medicine

## 2014-06-21 VITALS — BP 135/84 | HR 77 | Temp 97.5°F | Resp 18 | Ht 68.0 in | Wt 185.0 lb

## 2014-06-21 DIAGNOSIS — Z981 Arthrodesis status: Secondary | ICD-10-CM | POA: Diagnosis not present

## 2014-06-21 DIAGNOSIS — M461 Sacroiliitis, not elsewhere classified: Secondary | ICD-10-CM

## 2014-06-21 DIAGNOSIS — M19019 Primary osteoarthritis, unspecified shoulder: Secondary | ICD-10-CM | POA: Insufficient documentation

## 2014-06-21 DIAGNOSIS — M5136 Other intervertebral disc degeneration, lumbar region: Secondary | ICD-10-CM | POA: Insufficient documentation

## 2014-06-21 DIAGNOSIS — M545 Low back pain: Secondary | ICD-10-CM | POA: Diagnosis present

## 2014-06-21 DIAGNOSIS — M47818 Spondylosis without myelopathy or radiculopathy, sacral and sacrococcygeal region: Secondary | ICD-10-CM

## 2014-06-21 DIAGNOSIS — M961 Postlaminectomy syndrome, not elsewhere classified: Secondary | ICD-10-CM

## 2014-06-21 DIAGNOSIS — M19012 Primary osteoarthritis, left shoulder: Secondary | ICD-10-CM

## 2014-06-21 MED ORDER — OXYCODONE HCL 5 MG PO TABA: ORAL_TABLET | ORAL | Status: DC

## 2014-06-21 MED ORDER — TIZANIDINE HCL 4 MG PO TABS: ORAL_TABLET | ORAL | Status: DC

## 2014-06-21 MED ORDER — OXYCODONE HCL 5 MG PO CAPS: ORAL_CAPSULE | ORAL | Status: DC

## 2014-06-21 MED ORDER — ZOLPIDEM TARTRATE 10 MG PO TABS: 10.0000 mg | ORAL_TABLET | Freq: Every evening | ORAL | Status: DC | PRN

## 2014-06-21 MED ORDER — OXYCODONE-ACETAMINOPHEN 5-325 MG PO TABS: 1.0000 | ORAL_TABLET | ORAL | Status: DC | PRN

## 2014-06-21 NOTE — Progress Notes (Signed)
Discharge instructions given.  Teach back 3 done.  Scripts given for oxycodone, ambien,  Discharge time 1019 ambulatory.

## 2014-06-21 NOTE — Progress Notes (Signed)
   Subjective:    Patient ID: Derrick Kelley, male    DOB: 13-Oct-1960, 54 y.o.   MRN: [id number]  HPI    Review of Systems     Objective:   Physical Exam        Assessment & Plan:

## 2014-06-21 NOTE — Patient Instructions (Addendum)
Continue present medications. Oxycodone Zanaflex and Ambien. Patient advised regarding respiratory depression confusion and other undesirable side effects.  Sacroiliac joint on 07/04/2014  F/U PCP for evaliation of  BP and general medical  Condition.  F/U surgical evaluation.  F/U nrurological evaluation.  May consider radiofrequency rhizolysis or intraspinal procedures pending response to present treatment and F/U evaluation.  Patient to call [facility] should patient have concerns prior to scheduled return appointment. GENERAL RISKS AND COMPLICATIONS  What are the risk, side effects and possible complications? Generally speaking, most procedures are safe.  However, with any procedure there are risks, side effects, and the possibility of complications.  The risks and complications are dependent upon the sites that are lesioned, or the type of nerve block to be performed.  The closer the procedure is to the spine, the more serious the risks are.  Great care is taken when placing the radio frequency needles, block needles or lesioning probes, but sometimes complications can occur. 1. Infection: Any time there is an injection through the skin, there is a risk of infection.  This is why sterile conditions are used for these blocks.  There are four possible types of infection. 1. Localized skin infection. 2. Central Nervous System Infection-This can be in the form of Meningitis, which can be deadly. 3. Epidural Infections-This can be in the form of an epidural abscess, which can cause pressure inside of the spine, causing compression of the spinal cord with subsequent paralysis. This would require an emergency surgery to decompress, and there are no guarantees that the patient would recover from the paralysis. 4. Discitis-This is an infection of the intervertebral discs.  It occurs in about 1% of discography procedures.  It is difficult to treat and it may lead to surgery.         2. Pain: the needles have to go through skin and soft tissues, will cause soreness.       3. Damage to internal structures:  The nerves to be lesioned may be near blood vessels or    other nerves which can be potentially damaged.       4. Bleeding: Bleeding is more common if the patient is taking blood thinners such as  aspirin, Coumadin, Ticiid, Plavix, etc., or if he/she have some genetic predisposition  such as hemophilia. Bleeding into the spinal canal can cause compression of the spinal  cord with subsequent paralysis.  This would require an emergency surgery to  decompress and there are no guarantees that the patient would recover from the  paralysis.       5. Pneumothorax:  Puncturing of a lung is a possibility, every time a needle is introduced in  the area of the chest or upper back.  Pneumothorax refers to free air around the  collapsed lung(s), inside of the thoracic cavity (chest cavity).  Another two possible  complications related to a similar event would include: Hemothorax and Chylothorax.   These are variations of the Pneumothorax, where instead of air around the collapsed  lung(s), you may have blood or chyle, respectively.       6. Spinal headaches: They may occur with any procedures in the area of the spine.       7. Persistent CSF (Cerebro-Spinal Fluid) leakage: This is a rare problem, but may occur  with prolonged intrathecal or epidural catheters either due to the formation of a fistulous  track or a dural tear.       8. Nerve damage:  By working so close to the spinal cord, there is always a possibility of  nerve damage, which could be as serious as a permanent spinal cord injury with  paralysis.       9. Death:  Although rare, severe deadly allergic reactions known as "Anaphylactic  reaction" can occur to any of the medications used.      10. Worsening of the symptoms:  We can always make thing worse.  What are the chances of something like this happening? Chances of any of this  occuring are extremely low.  By statistics, you have more of a chance of getting killed in a motor vehicle accident: while driving to the hospital than any of the above occurring .  Nevertheless, you should be aware that they are possibilities.  In general, it is similar to taking a shower.  Everybody knows that you can slip, hit your head and get killed.  Does that mean that you should not shower again?  Nevertheless always keep in mind that statistics do not mean anything if you happen to be on the wrong side of them.  Even if a procedure has a 1 (one) in a 1,000,000 (million) chance of going wrong, it you happen to be that one..Also, keep in mind that by statistics, you have more of a chance of having something go wrong when taking medications.  Who should not have this procedure? If you are on a blood thinning medication (e.g. Coumadin, Plavix, see list of "Blood Thinners"), or if you have an active infection going on, you should not have the procedure.  If you are taking any blood thinners, please inform your physician.  How should I prepare for this procedure?  Do not eat or drink anything at least six hours prior to the procedure.  Bring a driver with you .  It cannot be a taxi.  Come accompanied by an adult that can drive you back, and that is strong enough to help you if your legs get weak or numb from the local anesthetic.  Take all of your medicines the morning of the procedure with just enough water to swallow them.  If you have diabetes, make sure that you are scheduled to have your procedure done first thing in the morning, whenever possible.  If you have diabetes, take only half of your insulin dose and notify our nurse that you have done so as soon as you arrive at the clinic.  If you are diabetic, but only take blood sugar pills (oral hypoglycemic), then do not take them on the morning of your procedure.  You may take them after you have had the procedure.  Do not take aspirin  or any aspirin-containing medications, at least eleven (11) days prior to the procedure.  They may prolong bleeding.  Wear loose fitting clothing that may be easy to take off and that you would not mind if it got stained with Betadine or blood.  Do not wear any jewelry or perfume  Remove any nail coloring.  It will interfere with some of our monitoring equipment.  NOTE: Remember that this is not meant to be interpreted as a complete list of all possible complications.  Unforeseen problems may occur.  BLOOD THINNERS The following drugs contain aspirin or other products, which can cause increased bleeding during surgery and should not be taken for 2 weeks prior to and 1 week after surgery.  If you should need take something for relief of minor pain, you may  take acetaminophen which is found in Tylenol,m Datril, Anacin-3 and Panadol. It is not blood thinner. The products listed below are.  Do not take any of the products listed below in addition to any listed on your instruction sheet.  A.P.C or A.P.C with Codeine Codeine Phosphate Capsules #3 Ibuprofen Ridaura  ABC compound Congesprin Imuran rimadil  Advil Cope Indocin Robaxisal  Alka-Seltzer Effervescent Pain Reliever and Antacid Coricidin or Coricidin-D  Indomethacin Rufen  Alka-Seltzer plus Cold Medicine Cosprin Ketoprofen S-A-C Tablets  Anacin Analgesic Tablets or Capsules Coumadin Korlgesic Salflex  Anacin Extra Strength Analgesic tablets or capsules CP-2 Tablets Lanoril Salicylate  Anaprox Cuprimine Capsules Levenox Salocol  Anexsia-D Dalteparin Magan Salsalate  Anodynos Darvon compound Magnesium Salicylate Sine-off  Ansaid Dasin Capsules Magsal Sodium Salicylate  Anturane Depen Capsules Marnal Soma  APF Arthritis pain formula Dewitt's Pills Measurin Stanback  Argesic Dia-Gesic Meclofenamic Sulfinpyrazone  Arthritis Bayer Timed Release Aspirin Diclofenac Meclomen Sulindac  Arthritis pain formula Anacin Dicumarol Medipren Supac   Analgesic (Safety coated) Arthralgen Diffunasal Mefanamic Suprofen  Arthritis Strength Bufferin Dihydrocodeine Mepro Compound Suprol  Arthropan liquid Dopirydamole Methcarbomol with Aspirin Synalgos  ASA tablets/Enseals Disalcid Micrainin Tagament  Ascriptin Doan's Midol Talwin  Ascriptin A/D Dolene Mobidin Tanderil  Ascriptin Extra Strength Dolobid Moblgesic Ticlid  Ascriptin with Codeine Doloprin or Doloprin with Codeine Momentum Tolectin  Asperbuf Duoprin Mono-gesic Trendar  Aspergum Duradyne Motrin or Motrin IB Triminicin  Aspirin plain, buffered or enteric coated Durasal Myochrisine Trigesic  Aspirin Suppositories Easprin Nalfon Trillsate  Aspirin with Codeine Ecotrin Regular or Extra Strength Naprosyn Uracel  Atromid-S Efficin Naproxen Ursinus  Auranofin Capsules Elmiron Neocylate Vanquish  Axotal Emagrin Norgesic Verin  Azathioprine Empirin or Empirin with Codeine Normiflo Vitamin E  Azolid Emprazil Nuprin Voltaren  Bayer Aspirin plain, buffered or children's or timed BC Tablets or powders Encaprin Orgaran Warfarin Sodium  Buff-a-Comp Enoxaparin Orudis Zorpin  Buff-a-Comp with Codeine Equegesic Os-Cal-Gesic   Buffaprin Excedrin plain, buffered or Extra Strength Oxalid   Bufferin Arthritis Strength Feldene Oxphenbutazone   Bufferin plain or Extra Strength Feldene Capsules Oxycodone with Aspirin   Bufferin with Codeine Fenoprofen Fenoprofen Pabalate or Pabalate-SF   Buffets II Flogesic Panagesic   Buffinol plain or Extra Strength Florinal or Florinal with Codeine Panwarfarin   Buf-Tabs Flurbiprofen Penicillamine   Butalbital Compound Four-way cold tablets Penicillin   Butazolidin Fragmin Pepto-Bismol   Carbenicillin Geminisyn Percodan   Carna Arthritis Reliever Geopen Persantine   Carprofen Gold's salt Persistin   Chloramphenicol Goody's Phenylbutazone   Chloromycetin Haltrain Piroxlcam   Clmetidine heparin Plaquenil   Cllnoril Hyco-pap Ponstel   Clofibrate Hydroxy  chloroquine Propoxyphen         Before stopping any of these medications, be sure to consult the physician who ordered them.  Some, such as Coumadin (Warfarin) are ordered to prevent or treat serious conditions such as "deep thrombosis", "pumonary embolisms", and other heart problems.  The amount of time that you may need off of the medication may also vary with the medication and the reason for which you were taking it.  If you are taking any of these medications, please make sure you notify your pain physician before you undergo any procedures.         Sacroiliac (SI) Joint Injection Patient Information  Description: The sacroiliac joint connects the scrum (very low back and tailbone) to the ilium (a pelvic bone which also forms half of the hip joint).  Normally this joint experiences very little motion.  When this joint becomes inflamed  or unstable low back and or hip and pelvis pain may result.  Injection of this joint with local anesthetics (numbing medicines) and steroids can provide diagnostic information and reduce pain.  This injection is performed with the aid of x-ray guidance into the tailbone area while you are lying on your stomach.   You may experience an electrical sensation down the leg while this is being done.  You may also experience numbness.  We also may ask if we are reproducing your normal pain during the injection.  Conditions which may be treated SI injection:   Low back, buttock, hip or leg pain  Preparation for the Injection:  1. Do not eat any solid food or dairy products within 6 hours of your appointment.  2. You may drink clear liquids up to 2 hours before appointment.  Clear liquids include water, black coffee, juice or soda.  No milk or cream please. 3. You may take your regular medications, including pain medications with a sip of water before your appointment.  Diabetics should hold regular insulin (if take separately) and take 1/2 normal NPH dose the  morning of the procedure.  Carry some sugar containing items with you to your appointment. 4. A driver must accompany you and be prepared to drive you home after your procedure. 5. Bring all of your current medications with you. 6. An IV may be inserted and sedation may be given at the discretion of the physician. 7. A blood pressure cuff, EKG and other monitors will often be applied during the procedure.  Some patients may need to have extra oxygen administered for a short period.  8. You will be asked to provide medical information, including your allergies, prior to the procedure.  We must know immediately if you are taking blood thinners (like Coumadin/Warfarin) or if you are allergic to IV iodine contrast (dye).  We must know if you could possible be pregnant.  Possible side effects:   Bleeding from needle site  Infection (rare, may require surgery)  Nerve injury (rare)  Numbness & tingling (temporary)  A brief convulsion or seizure  Light-headedness (temporary)  Pain at injection site (several days)  Decreased blood pressure (temporary)  Weakness in the leg (temporary)   Call if you experience:   New onset weakness or numbness of an extremity below the injection site that last more than 8 hours.  Hives or difficulty breathing ( go to the emergency room)  Inflammation or drainage at the injection site  Any new symptoms which are concerning to you  Please note:  Although the local anesthetic injected can often make your back/ hip/ buttock/ leg feel good for several hours after the injections, the pain will likely return.  It takes 3-7 days for steroids to work in the sacroiliac area.  You may not notice any pain relief for at least that one week.  If effective, we will often do a series of three injections spaced 3-6 weeks apart to maximally decrease your pain.  After the initial series, we generally will wait some months before a repeat injection of the same  type.  If you have any questions, please call [phone] [facility]

## 2014-06-21 NOTE — Progress Notes (Signed)
   Subjective:    Patient ID: Derrick Kelley, male    DOB: 15-Dec-1960, 54 y.o.   MRN: [id number]  HPI    Review of Systems     Objective:   Physical Exam        Assessment & Plan:

## 2014-06-21 NOTE — Progress Notes (Signed)
Patient is a 54 year old gentleman returns to pain management Center for further evaluation and treatment of lower back and lower extremity pain. The patient has had improvement with sacroiliac joint injections and is in hopes of undergoing radiofrequency rhizolysis of the equal iliac joint and lumbar facet regions we will use the Coolief technique. Patient states that he had difficulty sitting standing cutting the grass sleeping Patient is anxious to proceed with radiofrequency rhizolysis. Patient with pain of the shoulder and will follow-up with Dr. Tamera Punt status post surgery of the left shoulder  . We will consider patient for interventional treatment time return appointment and will proceed with block letters to the sacroiliac joint.  Physical examination  Patient with tenderness of the paraspinal musculature region cervical region cervical facet region of moderate degree with severe muscle spasms noted. with decreased range of motion of the shoulder noted as well. There was tenderness over the lumbar paraspinal musculature region of moderately severe degree especially the PSIS. S regions right greater than the left with tenderness of the gluteal gluteal and piriformis muscles as well with straight leg raising tolerate approximately 30 without increased pain with dorsiflexion noted was tenderness of the gluteal and piriformis musculature regions. Abdomen soft nontender without excessive tenderness to palpation. Patient with severe pain with palpation of the PSIS NP IS regions especially.  Assessment  Degenerative disc disease of spine Status post fusion L5-S1 L3-4 degenerative disc disease with moderate facet arthrosis Sacroiliac joint dysfunction Status post surgical intervention of shoulder  Plan Continue present medications. Zanaflex oxycodone Ambien  We'll perform block nerves to sacroiliac joint at time of return appointment  F/U PCP for evaliation of  BP and general medical   Condition.  F/U surgical evaluation.  F/U nrurological evaluation.  May consider radiofrequency rhizolysis or intraspinal procedures pending response to present treatment and F/U evaluation.  Patient to call [facility] Management Center should patient have concerns prior to scheduled return appointment.

## 2014-07-01 ENCOUNTER — Other Ambulatory Visit: Payer: Self-pay | Admitting: Pain Medicine

## 2014-07-01 DIAGNOSIS — M461 Sacroiliitis, not elsewhere classified: Secondary | ICD-10-CM

## 2014-07-01 DIAGNOSIS — M19012 Primary osteoarthritis, left shoulder: Secondary | ICD-10-CM

## 2014-07-01 DIAGNOSIS — M961 Postlaminectomy syndrome, not elsewhere classified: Secondary | ICD-10-CM

## 2014-07-01 DIAGNOSIS — M19011 Primary osteoarthritis, right shoulder: Secondary | ICD-10-CM

## 2014-07-01 DIAGNOSIS — M47818 Spondylosis without myelopathy or radiculopathy, sacral and sacrococcygeal region: Secondary | ICD-10-CM

## 2014-07-01 DIAGNOSIS — M47817 Spondylosis without myelopathy or radiculopathy, lumbosacral region: Secondary | ICD-10-CM

## 2014-07-01 DIAGNOSIS — M5136 Other intervertebral disc degeneration, lumbar region: Secondary | ICD-10-CM

## 2014-07-04 ENCOUNTER — Ambulatory Visit: Payer: BLUE CROSS/BLUE SHIELD | Admitting: Pain Medicine

## 2014-07-07 DIAGNOSIS — M533 Sacrococcygeal disorders, not elsewhere classified: Secondary | ICD-10-CM | POA: Insufficient documentation

## 2014-07-07 DIAGNOSIS — M47816 Spondylosis without myelopathy or radiculopathy, lumbar region: Secondary | ICD-10-CM | POA: Insufficient documentation

## 2014-07-15 ENCOUNTER — Other Ambulatory Visit: Payer: Self-pay | Admitting: Internal Medicine

## 2014-07-17 ENCOUNTER — Other Ambulatory Visit: Payer: Self-pay | Admitting: Pain Medicine

## 2014-07-20 ENCOUNTER — Encounter: Payer: Self-pay | Admitting: Pain Medicine

## 2014-07-20 ENCOUNTER — Ambulatory Visit: Payer: BLUE CROSS/BLUE SHIELD | Attending: Pain Medicine | Admitting: Pain Medicine

## 2014-07-20 VITALS — BP 148/90 | HR 61 | Temp 98.4°F | Resp 18 | Ht 68.0 in | Wt 185.0 lb

## 2014-07-20 DIAGNOSIS — M961 Postlaminectomy syndrome, not elsewhere classified: Secondary | ICD-10-CM

## 2014-07-20 DIAGNOSIS — M79604 Pain in right leg: Secondary | ICD-10-CM | POA: Diagnosis present

## 2014-07-20 DIAGNOSIS — M47816 Spondylosis without myelopathy or radiculopathy, lumbar region: Secondary | ICD-10-CM | POA: Diagnosis not present

## 2014-07-20 DIAGNOSIS — M47818 Spondylosis without myelopathy or radiculopathy, sacral and sacrococcygeal region: Secondary | ICD-10-CM

## 2014-07-20 DIAGNOSIS — M79605 Pain in left leg: Secondary | ICD-10-CM | POA: Diagnosis present

## 2014-07-20 DIAGNOSIS — M533 Sacrococcygeal disorders, not elsewhere classified: Secondary | ICD-10-CM | POA: Insufficient documentation

## 2014-07-20 DIAGNOSIS — M545 Low back pain: Secondary | ICD-10-CM | POA: Diagnosis present

## 2014-07-20 DIAGNOSIS — M5136 Other intervertebral disc degeneration, lumbar region: Secondary | ICD-10-CM | POA: Insufficient documentation

## 2014-07-20 DIAGNOSIS — Z981 Arthrodesis status: Secondary | ICD-10-CM | POA: Diagnosis not present

## 2014-07-20 DIAGNOSIS — M19019 Primary osteoarthritis, unspecified shoulder: Secondary | ICD-10-CM | POA: Diagnosis not present

## 2014-07-20 DIAGNOSIS — M19011 Primary osteoarthritis, right shoulder: Secondary | ICD-10-CM

## 2014-07-20 DIAGNOSIS — M19012 Primary osteoarthritis, left shoulder: Secondary | ICD-10-CM

## 2014-07-20 DIAGNOSIS — M461 Sacroiliitis, not elsewhere classified: Secondary | ICD-10-CM | POA: Insufficient documentation

## 2014-07-20 DIAGNOSIS — Z9889 Other specified postprocedural states: Secondary | ICD-10-CM | POA: Insufficient documentation

## 2014-07-20 MED ORDER — ZOLPIDEM TARTRATE 10 MG PO TABS: ORAL_TABLET | ORAL | Status: DC

## 2014-07-20 MED ORDER — OXYCODONE HCL 5 MG PO CAPS: ORAL_CAPSULE | ORAL | Status: DC

## 2014-07-20 MED ORDER — TIZANIDINE HCL 4 MG PO TABS: ORAL_TABLET | ORAL | Status: DC

## 2014-07-20 NOTE — Progress Notes (Signed)
Safety precautions to be maintained throughout the outpatient stay will include: orient to surroundings, keep bed in low position, maintain call bell within reach at all times, provide assistance with transfer out of bed and ambulation.  

## 2014-07-20 NOTE — Patient Instructions (Addendum)
Continue present medications. Zanaflex Ambien and oxycodone  Radiofrequency rhizolysis to be performed as planned  F/U PCP for evaliation of  BP and general medical  condition.  F/U surgical evaluation.  F/U neurological evaluation.  May consider radiofrequency rhizolysis or intraspinal procedures pending response to present treatment and F/U evaluation.  Patient to call [facility] should patient have concerns prior to scheduled return appointment.

## 2014-07-20 NOTE — Progress Notes (Signed)
   Subjective:    Patient ID: Derrick Kelley, male    DOB: Jan 31, 1961, 54 y.o.   MRN: [id number]  HPI  Patient 54 year old gentleman returns to pain management Center for further evaluation and treatment of pain involving the region of the lower back lower extremity region and region of the left shoulder. Patient has had significant improvement prior interventional treatment and [facility] and was in hopes of being able to undergo interventional treatment for his lumbar lower extremity pain. A should present time is awaiting to undergo radiofrequency rhizolysis treatment of his lumbar and lower extremity pain. We will continue medications as prescribed this time and avoid and additional interventional treatment. Patient tolerating medications well without undesirable side effects. Patient has been advised to undergo follow-up evaluation of his shoulder with Dr. Tamera Punt surgery of shoulder     Review of Systems     Objective:   Physical Exam  There was tendernessAnd occipitalis musculature regions of mild degree with mild tinnitus of the acromial clavicular and glenohumeral joint region. Patient appeared to be with decreased grip strength on the left compared to the right with limited range of motion of the left shoulder compared to the right shoulder. Was tends to palpation of the cervical facet cervical paraspinal musculature region of moderate degree with moderate tenderness of the thoracic facet thoracic paraspinal musculature region as well. No crepitus of the thoracic region noted. Tinel and Phalen's maneuver without increase of pain of any significant degree. Palpation over the region of thoracic spinal mustering thoracic facet region without increased pain in the lower thoracic region with evidence of muscle spasms of the lower thoracic region been noted. There was tenderness over the PSIS and PII S region of moderate to moderately severe degree. A should of the gluteal and  piriformis musculature region reproduced moderate to moderately severe discomfort as well. There was questionable positive Patrick's maneuver. No definite sensory deficit of dermatomal just patient was detected. There appeared to be decreased EHL strength. Along the greater trochanteric region iliotibial band region of moderate degree abdomen nontender and no costovertebral tenderness was noted.       Assessment & Plan:    Degenerative disc disease lumbar spine Status post L4-L5 posterior lumbar interbody fusion and decompression of the central canal with solid fusion L5-S1, L3-4 degenerative disc disease, moderate facet arthrosis.  Lumbar facet syndrome  Sacroiliac joint dysfunction, sacroiliitis  Degenerative joint disease shoulder Status post surgery of shoulder (left)    Plan  Continue present medications. Oxycodone Zanaflex and Ambien  Radiofrequency rhizolysis for lumbar and lower extremity pain to be performed as discussed  Surgical follow-up evaluation of shoulder status post surgery of shoulder. Patient will follow-up Dr. Tamera Punt in this regard  F/U PCP for evaliation of  BP and general medical  condition.  F/U surgical evaluation.  F/U neurological evaluation.  May consider radiofrequency rhizolysis or intraspinal procedures pending response to present treatment and F/U evaluation.  Patient to call [facility] Management Center should patient have concerns prior to scheduled return appointment.

## 2014-07-28 ENCOUNTER — Other Ambulatory Visit: Payer: Self-pay | Admitting: Pain Medicine

## 2014-08-16 ENCOUNTER — Encounter: Payer: Self-pay | Admitting: Pain Medicine

## 2014-08-16 ENCOUNTER — Ambulatory Visit: Payer: BLUE CROSS/BLUE SHIELD | Attending: Pain Medicine | Admitting: Pain Medicine

## 2014-08-16 VITALS — BP 141/76 | HR 78 | Temp 97.7°F | Resp 18 | Ht 68.0 in | Wt 175.0 lb

## 2014-08-16 DIAGNOSIS — M47816 Spondylosis without myelopathy or radiculopathy, lumbar region: Secondary | ICD-10-CM

## 2014-08-16 DIAGNOSIS — M79605 Pain in left leg: Secondary | ICD-10-CM | POA: Diagnosis present

## 2014-08-16 DIAGNOSIS — M533 Sacrococcygeal disorders, not elsewhere classified: Secondary | ICD-10-CM | POA: Diagnosis not present

## 2014-08-16 DIAGNOSIS — M79604 Pain in right leg: Secondary | ICD-10-CM | POA: Diagnosis present

## 2014-08-16 DIAGNOSIS — M19012 Primary osteoarthritis, left shoulder: Secondary | ICD-10-CM | POA: Diagnosis not present

## 2014-08-16 DIAGNOSIS — M545 Low back pain: Secondary | ICD-10-CM | POA: Diagnosis present

## 2014-08-16 DIAGNOSIS — M5136 Other intervertebral disc degeneration, lumbar region: Secondary | ICD-10-CM | POA: Insufficient documentation

## 2014-08-16 DIAGNOSIS — M19011 Primary osteoarthritis, right shoulder: Secondary | ICD-10-CM | POA: Insufficient documentation

## 2014-08-16 DIAGNOSIS — M461 Sacroiliitis, not elsewhere classified: Secondary | ICD-10-CM

## 2014-08-16 DIAGNOSIS — Z981 Arthrodesis status: Secondary | ICD-10-CM | POA: Diagnosis not present

## 2014-08-16 DIAGNOSIS — M961 Postlaminectomy syndrome, not elsewhere classified: Secondary | ICD-10-CM

## 2014-08-16 DIAGNOSIS — M47817 Spondylosis without myelopathy or radiculopathy, lumbosacral region: Secondary | ICD-10-CM

## 2014-08-16 DIAGNOSIS — Z9889 Other specified postprocedural states: Secondary | ICD-10-CM | POA: Diagnosis not present

## 2014-08-16 DIAGNOSIS — M47818 Spondylosis without myelopathy or radiculopathy, sacral and sacrococcygeal region: Secondary | ICD-10-CM

## 2014-08-16 MED ORDER — TIZANIDINE HCL 4 MG PO TABS: ORAL_TABLET | ORAL | Status: DC

## 2014-08-16 MED ORDER — OXYCODONE HCL 5 MG PO CAPS: ORAL_CAPSULE | ORAL | Status: DC

## 2014-08-16 MED ORDER — ZOLPIDEM TARTRATE 10 MG PO TABS: ORAL_TABLET | ORAL | Status: DC

## 2014-08-16 NOTE — Progress Notes (Signed)
Safety precautions to be maintained throughout the outpatient stay will include: orient to surroundings, keep bed in low position, maintain call bell within reach at all times, provide assistance with transfer out of bed and ambulation.  

## 2014-08-16 NOTE — Patient Instructions (Addendum)
Continue present medications Zanaflex Ambien oxycodone  As discussed., we will consider performing radiofrequency once the machine is available  F/U PCP for evaliation of  BP and general medical  condition.  F/U surgical evaluation with Dr. Tamera Punt as discussed  F/U neurological evaluation  May consider radiofrequency rhizolysis or intraspinal procedures pending response to present treatment and F/U evaluation.  Patient to call [facility] Management Center should patient have concerns prior to scheduled return appointment.

## 2014-08-16 NOTE — Progress Notes (Signed)
   Subjective:    Patient ID: Derrick Kelley, male    DOB: May 18, 1960, 54 y.o.   MRN: [id number]  HPI  Patient is 54 year old gentleman [facility] and treatment of pain involving the lower back lower extremity region and region of the shoulder. Patient is status post surgery of the shoulder. And will undergo follow-up evaluation with Dr. Tamera Punt regarding his shoulder as discussed. Patient also is in hopes of being able to undergo radiofrequency rhizolysis or seizure for his lower back lower extremity pain. We discussed scheduling the patient for radiofrequency procedures and we will verify approval for patient to have the radiofrequency procedure performed. We will continue medications as prescribed at this time. The patient was understanding and agree with suggested treatment plan. Patient will continue Zanaflex oxycodone and Ambien. We will consider radiofrequency procedure pending insurance approval and follow-up evaluation as discussed with patient on today's visit. Patient was understanding and agreement with plan.     Review of Systems     Objective:   Physical Exam There was tenderness of the splenius capitis and occipitalis musculature region of mild degree. No areas of severe tenderness to palpation of the cervical region was noted there was mild tenderness over the cervical facet cervical paraspinal musculature region as well as the thoracic facet and thoracic paraspinal musculature region. Patient appeared to be with unremarkable Spurling's maneuver. There was tenderness of the acromioclavicular glenohumeral joint region with limited range of motion of the shoulders noted. Patient appeared to be with decreased grip strength. Tinel and Phalen's maneuver were without increase of pain of significant degree. Palpation over the thoracic facet thoracic paraspinal musculature regions reproduced pain of mild degree no crepitus of the thoracic region  noted. Palpation over the lumbar paraspinal musculature region lumbar facet region was a tends to palpation of moderate degree. Lateral bending and rotation and extension and palpation of the lumbar facets reproduce moderate discomfort. He raising was tolerates approximately 20 without a definite increase of pain with dorsiflexion noted. There was negative clonus negative Homans. Palpation over the PSIS and PII S region reproduced moderate discomfort. DTRs difficult to elicit patient had difficulty relaxing. No definite sensory deficit of dermatomal disabused was detected. There was negative clonus negative Homans. Abdomen nontender and no costovertebral angle tenderness noted.       Assessment & Plan:    Degenerative disc disease lumbar spine Status post L4-L5 posterior lumbar interbody fusion and decompression of the central canal with solid fusion L5-S1, L3-4 degenerative disc disease, moderate facet arthrosis.  Lumbar facet syndrome  Sacroiliac joint dysfunction  Degenerative joint disease of shoulders Status post surgery of shoulder    Plan  Continue present medications Zanaflex Ambien oxycodone  F/U PCP for evaliation of  BP and general medical  condition.  F/U surgical evaluation Dr. Tamera Punt as discussed for evaluation of shoulder  F/U neurological evaluation  May consider radiofrequency rhizolysis or intraspinal procedures pending response to present treatment and F/U evaluation. We we will verify patient's approval for radiofrequency rhizolysis to be performed for patient's lumbar lower extremity pain and we will proceed with radiofrequency rhizolysis pending follow-up evaluation of patient as discussed  [facility] should patient have concerns prior to scheduled return appointment. Zanaflex and Ambien

## 2014-08-16 NOTE — Progress Notes (Signed)
Discharge patient home ambulatory at 1016 hrs Script given for oxycodone and ambien Patient to return for  Next month

## 2014-08-27 ENCOUNTER — Other Ambulatory Visit: Payer: Self-pay | Admitting: Internal Medicine

## 2014-08-30 ENCOUNTER — Other Ambulatory Visit: Payer: Self-pay | Admitting: Pain Medicine

## 2014-08-30 DIAGNOSIS — Z9889 Other specified postprocedural states: Secondary | ICD-10-CM

## 2014-08-30 DIAGNOSIS — M533 Sacrococcygeal disorders, not elsewhere classified: Secondary | ICD-10-CM

## 2014-08-30 DIAGNOSIS — M5136 Other intervertebral disc degeneration, lumbar region: Secondary | ICD-10-CM

## 2014-08-30 DIAGNOSIS — M47816 Spondylosis without myelopathy or radiculopathy, lumbar region: Secondary | ICD-10-CM

## 2014-09-06 ENCOUNTER — Other Ambulatory Visit: Payer: Self-pay

## 2014-09-06 ENCOUNTER — Encounter: Payer: Self-pay | Admitting: Internal Medicine

## 2014-09-06 ENCOUNTER — Ambulatory Visit (INDEPENDENT_AMBULATORY_CARE_PROVIDER_SITE_OTHER): Payer: BLUE CROSS/BLUE SHIELD | Admitting: Internal Medicine

## 2014-09-06 VITALS — BP 136/74 | HR 65 | Temp 98.3°F | Wt 192.0 lb

## 2014-09-06 DIAGNOSIS — I10 Essential (primary) hypertension: Secondary | ICD-10-CM | POA: Diagnosis not present

## 2014-09-06 DIAGNOSIS — R609 Edema, unspecified: Secondary | ICD-10-CM

## 2014-09-06 DIAGNOSIS — F329 Major depressive disorder, single episode, unspecified: Secondary | ICD-10-CM | POA: Diagnosis not present

## 2014-09-06 DIAGNOSIS — F32A Depression, unspecified: Secondary | ICD-10-CM

## 2014-09-06 LAB — CBC
HEMATOCRIT: 50.1 % (ref 39.0–52.0)
Hemoglobin: 17.2 g/dL — ABNORMAL HIGH (ref 13.0–17.0)
MCHC: 34.3 g/dL (ref 30.0–36.0)
MCV: 90.7 fl (ref 78.0–100.0)
PLATELETS: 190 10*3/uL (ref 150.0–400.0)
RBC: 5.52 Mil/uL (ref 4.22–5.81)
RDW: 12.9 % (ref 11.5–15.5)
WBC: 7.4 10*3/uL (ref 4.0–10.5)

## 2014-09-06 LAB — COMPREHENSIVE METABOLIC PANEL
ALK PHOS: 65 U/L (ref 39–117)
ALT: 17 U/L (ref 0–53)
AST: 21 U/L (ref 0–37)
Albumin: 4.3 g/dL (ref 3.5–5.2)
BUN: 19 mg/dL (ref 6–23)
CHLORIDE: 103 meq/L (ref 96–112)
CO2: 30 mEq/L (ref 19–32)
CREATININE: 1.45 mg/dL (ref 0.40–1.50)
Calcium: 9.6 mg/dL (ref 8.4–10.5)
GFR: 53.89 mL/min — ABNORMAL LOW (ref >60.00)
GLUCOSE: 80 mg/dL (ref 70–99)
POTASSIUM: 4.3 meq/L (ref 3.5–5.1)
Sodium: 138 mEq/L (ref 135–145)
Total Bilirubin: 0.8 mg/dL (ref 0.2–1.2)
Total Protein: 6.6 g/dL (ref 6.0–8.3)

## 2014-09-06 MED ORDER — SERTRALINE HCL 100 MG PO TABS: 200.0000 mg | ORAL_TABLET | Freq: Every day | ORAL | Status: DC

## 2014-09-06 NOTE — Progress Notes (Signed)
Subjective:    Patient ID: Derrick Kelley, male    DOB: 1960-04-26, 54 y.o.   MRN: [id number]  HPI  Pt presents to the clinic today with c/o elevated blood pressure and swelling in his ankles. This has been going on for the last 2 weeks. It is intermittent. He is taking Avapro and Norvasc. He has been on HCTZ in the past but actually had syncope secondary to hypotension (although at that time he was not taken the medications he was prescribed correctly). He has not been checking his BP at home. He denies chest pain, dizziness or shortness of breath. He denies syncopal episodes. He has noticed that he gets headaches when he is stressed and under a lot of pressure. He is under more stress because he is out of work and he is having some legal trouble. He is taking Zoloft daily and does feel like it helps some. He has seen cardiology in the past. ECG from 05/2013 reviewed. His BP today is 136/74.  Review of Systems      Past Medical History  Diagnosis Date  . BACK PAIN, CHRONIC 05/11/2009    bone on bone;hx of herniated disc  . Flushing 05/11/2009  . HYPERLIPIDEMIA     takes Lipitor daily  . HYPOGONADISM 05/11/2009    takes Testosterone every 14days  . INSOMNIA-SLEEP DISORDER-UNSPEC 10/10/2009    takes Ambien nightly  . POLYCYTHEMIA 05/11/2009  . SCIATICA, RIGHT 09/20/2009  . SINUSITIS- ACUTE-NOS 10/10/2009  . URI 05/11/2009  . Arthritis   . Chronic back pain     hx buldging disc;  . Hemorrhoids     hx of  . HYPERTENSION     takes Avapro,Amlodipine,and Lisinopril daily  . Headache(784.0) 10/10/2009    cluster;last one about a yr ago  . Joint pain   . Joint pain   . GERD 05/11/2009    but doesn't require meds  . DEPRESSION     takes Zoloft daily  . Sleep apnea     use CPAP  . Hypercholesteremia     Current Outpatient Prescriptions  Medication Sig Dispense Refill  . amLODipine (NORVASC) 10 MG tablet Take 1 tablet (10 mg total) by mouth daily. NO MORE REFILLS PLEASE CALL TO  SCHEDULE ANNUAL OFFICE VISIT 30 tablet 1  . atorvastatin (LIPITOR) 20 MG tablet Take 1 tablet (20 mg total) by mouth daily. NO MORE REFILLS PLEASE CALL TO SCHEDULE ANNUAL OFFICE VISIT 30 tablet 1  . cyclobenzaprine (FLEXERIL) 10 MG tablet Take 10 mg by mouth 2 (two) times daily as needed.     . docusate sodium (COLACE) 100 MG capsule Take 1 capsule (100 mg total) by mouth 3 (three) times daily as needed. 20 capsule 0  . fexofenadine-pseudoephedrine (ALLEGRA-D) 60-120 MG per tablet Take 1 tablet by mouth 2 (two) times daily.    Marland Kitchen gabapentin (NEURONTIN) 800 MG tablet Take by mouth.    . irbesartan (AVAPRO) 150 MG tablet Take 1 tablet (150 mg total) by mouth daily. NEED TO SCHEDULE ANNUAL PHYSICAL [phone] 30 tablet 1  . Multiple Vitamins-Minerals (MULTIVITAMIN WITH MINERALS) tablet Take 1 tablet by mouth daily.    . mupirocin ointment (BACTROBAN) 2 %     . Omega-3 Fatty Acids (FISH OIL) 300 MG CAPS Take 1 capsule by mouth 2 (two) times daily.    Marland Kitchen oxycodone (OXY-IR) 5 MG capsule Limit 3-6 tablets by mouth daily if tolerated 170 per month 170 capsule 0  . pantoprazole (PROTONIX) 40 MG tablet Take  40 mg by mouth daily.    . sertraline (ZOLOFT) 100 MG tablet Take 1-1.5 tablets (100-150 mg total) by mouth daily. NEED TO SCHEDULE ANNUAL PHYSICAL FOR MORE REFILLS 45 tablet 1  . tamsulosin (FLOMAX) 0.4 MG CAPS capsule Take 0.4 mg by mouth daily.    Marland Kitchen testosterone cypionate (DEPOTESTOTERONE CYPIONATE) 200 MG/ML injection Inject 1 mL (200 mg total) into the muscle every 14 (fourteen) days. 1 cc biweekly IM (self Injected)  Last injection was on 04/12/11 10 mL 5  . tiZANidine (ZANAFLEX) 4 MG tablet Limited 2-4 tablets by mouth per day if tolerated. Limit 100 per month 100 tablet 0  . tiZANidine (ZANAFLEX) 4 MG tablet Limited 1 tablet by mouth 2-4 times per day if tolerated 120 tablet 2  . traZODone (DESYREL) 100 MG tablet TAKE ONE TABLET BY MOUTH AT BEDTIME 30 tablet 0  . zolpidem (AMBIEN) 10 MG tablet  Limit 1 tablet by mouth at bedtime when necessary for insomnia 30 tablet 0   No current facility-administered medications for this visit.    Allergies  Allergen Reactions  . Bupropion Hcl     REACTION: nightmares  . Codeine Hives    Family History  Problem Relation Age of Onset  . Alcohol abuse Father   . Cancer Father     prostate  . Alcohol abuse Maternal Aunt   . Alcohol abuse Maternal Uncle   . Cancer Paternal Uncle     prostate  . Cancer Maternal Grandmother     ovarian cancer  . Alcohol abuse Other   . Anesthesia problems Neg Hx   . Hypotension Neg Hx   . Malignant hyperthermia Neg Hx   . Pseudochol deficiency Neg Hx     History   Social History  . Marital Status: Married    Spouse Name: N/A  . Number of Children: 2  . Years of Education: N/A   Occupational History  . unemployed    Social History Main Topics  . Smoking status: Never Smoker   . Smokeless tobacco: Never Used  . Alcohol Use: 3.0 oz/week    5 Standard drinks or equivalent per week     Comment: occasional  . Drug Use: No  . Sexual Activity: Yes   Other Topics Concern  . Not on file   Social History Narrative     Constitutional: Denies fever, malaise, fatigue, headache or abrupt weight changes.  Respiratory: Denies difficulty breathing, shortness of breath, cough or sputum production.   Cardiovascular: Pt reports swelling in his feet. Denies chest pain, chest tightness, palpitations or swelling in the hands.   Neurological: Denies dizziness, difficulty with memory, difficulty with speech or problems with balance and coordination.  Psych: Pt reports depression. Denies anxiety, SI/HI.  No other specific complaints in a complete review of systems (except as listed in HPI above).   Objective:   Physical Exam   BP 136/74 mmHg  Pulse 65  Temp(Src) 98.3 F (36.8 C) (Oral)  Wt 192 lb (87.091 kg)  SpO2 98% Wt Readings from Last 3 Encounters:  09/06/14 192 lb (87.091 kg)  08/16/14  175 lb (79.379 kg)  07/20/14 185 lb (83.915 kg)    General: Appears his stated age, well developed, well nourished in NAD. HEENT: Head: normal shape and size; Eyes: sclera white, no icterus, conjunctiva pink, PERRLA and EOMs intact; Cardiovascular: Normal rate and rhythm. S1,S2 noted.  No murmur, rubs or gallops noted. Trace BLE pitting edema.  Pulmonary/Chest: Normal effort and positive vesicular breath  sounds. No respiratory distress. No wheezes, rales or ronchi noted.  Neurological: Alert and oriented.  Psychiatric: Mood and affect normal. Behavior is normal. Judgment and thought content normal.     BMET    Component Value Date/Time   NA 137 01/19/2014 1417   K 4.2 01/19/2014 1417   CL 100 01/19/2014 1417   CO2 24 01/19/2014 1417   GLUCOSE 75 01/19/2014 1417   BUN 21 01/19/2014 1417   CREATININE 1.06 01/19/2014 1417   CALCIUM 9.4 01/19/2014 1417   GFRNONAA 78* 01/19/2014 1417   GFRAA >90 01/19/2014 1417    Lipid Panel     Component Value Date/Time   CHOL 154 09/30/2013 1153   TRIG 143.0 09/30/2013 1153   HDL 40.60 09/30/2013 1153   CHOLHDL 4 09/30/2013 1153   VLDL 28.6 09/30/2013 1153   LDLCALC 85 09/30/2013 1153    CBC    Component Value Date/Time   WBC 8.9 01/28/2014 0500   WBC 7.4 01/02/2010 1416   RBC 4.03* 01/28/2014 0500   RBC 4.86 01/02/2010 1416   HGB 13.2 01/28/2014 0500   HGB Unable to Determine 01/02/2010 1416   HCT 37.2* 01/28/2014 0500   HCT 41.2 01/02/2010 1416   PLT 155 01/28/2014 0500   PLT 208 01/02/2010 1416   MCV 92.3 01/28/2014 0500   MCV 84.8 01/02/2010 1416   MCH 32.8 01/28/2014 0500   MCH Unable to determine 01/02/2010 1416   MCHC 35.5 01/28/2014 0500   MCHC Unable to determine 01/02/2010 1416   RDW 14.1 01/28/2014 0500   RDW 12.9 01/02/2010 1416   LYMPHSABS 0.9 01/19/2014 1417   LYMPHSABS 1.6 01/02/2010 1416   MONOABS 0.7 01/19/2014 1417   MONOABS 0.4 01/02/2010 1416   EOSABS 0.1 01/19/2014 1417   EOSABS 0.1 01/02/2010 1416     BASOSABS 0.0 01/19/2014 1417   BASOSABS 0.0 01/02/2010 1416    Hgb A1C No results found for: HGBA1C      Assessment & Plan:   Peripheral edema:  Will check CBC and CMET today Likely dependant Given history of syncope with HCTZ, will hold off on diuretic for now Keep your legs elevated  HTN:  Continue Norvasc and Avapro at this time Will continue to monitor BP  Depression:  Slightly worse secondary to stress Increase Zoloft to 200 mg daily New RX given  Make an appt for your annual exam

## 2014-09-06 NOTE — Progress Notes (Signed)
Pre visit review using our clinic review tool, if applicable. No additional management support is needed unless otherwise documented below in the visit note. 

## 2014-09-06 NOTE — Patient Instructions (Signed)
Peripheral Edema °You have swelling in your legs (peripheral edema). This swelling is due to excess accumulation of salt and water in your body. Edema may be a sign of heart, kidney or liver disease, or a side effect of a medication. It may also be due to problems in the leg veins. Elevating your legs and using special support stockings may be very helpful, if the cause of the swelling is due to poor venous circulation. Avoid long periods of standing, whatever the cause. °Treatment of edema depends on identifying the cause. Chips, pretzels, pickles and other salty foods should be avoided. Restricting salt in your diet is almost always needed. Water pills (diuretics) are often used to remove the excess salt and water from your body via urine. These medicines prevent the kidney from reabsorbing sodium. This increases urine flow. °Diuretic treatment may also result in lowering of potassium levels in your body. Potassium supplements may be needed if you have to use diuretics daily. Daily weights can help you keep track of your progress in clearing your edema. You should call your caregiver for follow up care as recommended. °SEEK IMMEDIATE MEDICAL CARE IF:  °· You have increased swelling, pain, redness, or heat in your legs. °· You develop shortness of breath, especially when lying down. °· You develop chest or abdominal pain, weakness, or fainting. °· You have a fever. °Document Released: 03/07/2004 Document Revised: 04/22/2011 Document Reviewed: 02/15/2009 °[employer]® Patient Information ©2015 [employer]. This information is not intended to replace advice given to you by your health care provider. Make sure you discuss any questions you have with your health care provider. ° °

## 2014-09-07 MED ORDER — SERTRALINE HCL 100 MG PO TABS: 200.0000 mg | ORAL_TABLET | Freq: Every day | ORAL | Status: DC

## 2014-09-07 NOTE — Addendum Note (Signed)
Addended by: Lurlean Nanny on: 09/07/2014 08:16 AM   Modules accepted: Orders

## 2014-09-13 ENCOUNTER — Ambulatory Visit: Payer: BLUE CROSS/BLUE SHIELD | Attending: Pain Medicine | Admitting: Pain Medicine

## 2014-09-13 VITALS — BP 158/82 | HR 69 | Temp 97.5°F | Resp 16 | Ht 68.0 in | Wt 185.0 lb

## 2014-09-13 DIAGNOSIS — Z9889 Other specified postprocedural states: Secondary | ICD-10-CM | POA: Insufficient documentation

## 2014-09-13 DIAGNOSIS — M47816 Spondylosis without myelopathy or radiculopathy, lumbar region: Secondary | ICD-10-CM

## 2014-09-13 DIAGNOSIS — Z7289 Other problems related to lifestyle: Secondary | ICD-10-CM

## 2014-09-13 DIAGNOSIS — Z789 Other specified health status: Secondary | ICD-10-CM

## 2014-09-13 DIAGNOSIS — M79605 Pain in left leg: Secondary | ICD-10-CM | POA: Diagnosis present

## 2014-09-13 DIAGNOSIS — F1910 Other psychoactive substance abuse, uncomplicated: Secondary | ICD-10-CM

## 2014-09-13 DIAGNOSIS — M5136 Other intervertebral disc degeneration, lumbar region: Secondary | ICD-10-CM | POA: Insufficient documentation

## 2014-09-13 DIAGNOSIS — M19011 Primary osteoarthritis, right shoulder: Secondary | ICD-10-CM | POA: Diagnosis not present

## 2014-09-13 DIAGNOSIS — M51369 Other intervertebral disc degeneration, lumbar region without mention of lumbar back pain or lower extremity pain: Secondary | ICD-10-CM

## 2014-09-13 DIAGNOSIS — M545 Low back pain: Secondary | ICD-10-CM | POA: Diagnosis present

## 2014-09-13 DIAGNOSIS — M533 Sacrococcygeal disorders, not elsewhere classified: Secondary | ICD-10-CM

## 2014-09-13 DIAGNOSIS — M19012 Primary osteoarthritis, left shoulder: Secondary | ICD-10-CM | POA: Insufficient documentation

## 2014-09-13 DIAGNOSIS — M47817 Spondylosis without myelopathy or radiculopathy, lumbosacral region: Secondary | ICD-10-CM

## 2014-09-13 DIAGNOSIS — M461 Sacroiliitis, not elsewhere classified: Secondary | ICD-10-CM

## 2014-09-13 DIAGNOSIS — M79604 Pain in right leg: Secondary | ICD-10-CM | POA: Diagnosis present

## 2014-09-13 DIAGNOSIS — M47818 Spondylosis without myelopathy or radiculopathy, sacral and sacrococcygeal region: Secondary | ICD-10-CM

## 2014-09-13 DIAGNOSIS — F1090 Alcohol use, unspecified, uncomplicated: Secondary | ICD-10-CM

## 2014-09-13 MED ORDER — ZOLPIDEM TARTRATE 10 MG PO TABS: ORAL_TABLET | ORAL | Status: DC

## 2014-09-13 MED ORDER — OXYCODONE HCL 5 MG PO CAPS: ORAL_CAPSULE | ORAL | Status: DC

## 2014-09-13 NOTE — Progress Notes (Signed)
Subjective:    Patient ID: Derrick Kelley, male    DOB: 1960-10-19, 54 y.o.   MRN: [id number]  HPI  Patient is 54 year old gentleman [facility] evaluation and treatment of pain involving the lower back and lower extremity region. Patient also status post surgery of the left shoulder. We discussed patient's condition on today's visit including urine drug screen review. We informed patient that due to the inconsistencies we will need to modify his medications at this time. We discussed the urine drug screen in detail and informed patient that we wish to have patient repeat urine drug screen on today's visit proceed with psych evaluation and that we will consider modification of treatment pending follow-up evaluations as discussed the patient was understanding and in agreement status treatment plan We also discussed patient undergoing radiofrequency rhizolysis lumbar facet region and will consider such treatment. Due to urine drug screen results we also informed patient wishes to proceed with psych evaluation and would modify medications on today's visit due to the urine drug screen results. Patient appeared to be understanding and agreement with suggested treatment plan   Review of Systems     Objective:   Physical Exam  There was tenderness to palpation of the splenius capitis occipitalis musculature region of mild to moderate degree. There was mild to moderate tenderness of the cervical facet cervical paraspinal musculature region. Palpation of the trapezius levator scapula and rhomboid musculature region was with mild to moderate discomfort. There appeared to be unremarkable Spurling's maneuver. Patient was with mild difficulty performing the drop test. There appeared to be bilaterally equal grip strength with no abnormalities noted such as decreased sensation of dermatomal distribution. There was tenderness to palpation of the thoracic facet thoracic paraspinal  musculature region without crepitus of the thoracic region noted. Palpation over the lumbar paraspinal musculature region lumbar facet region was associated with tenderness to palpation of moderate degree with lateral bending rotation reproducing moderate discomfort. There was straight leg raising tolerates approximately 30 without increased pain with dorsiflexion noted. There was increased pain with palpation of the PSIS and PII S regions. Gluteal and piriformis muscles were with tenderness to palpation of moderate degree. Palpation of the greater trochanteric region iliotibial band region was with mild to moderate discomfort. DTRs were difficult to elicit there was negative clonus negative Homans there appeared to be positive Patrick's maneuver.        Assessment & Plan:    Degenerative disc disease lumbar spine Status post L4-L5 posterior lumbar interbody fusion and decompression of the central canal with solid fusion L5-S1, L3-4 degenerative disc disease, moderate facet arthrosis.  Lumbar facet syndrome  Sacroiliac joint dysfunction  Degenerative joint disease of shoulders Status post surgery of shoulder    Plan   Continue present medications Zanaflex Ambien oxycodone. Please note oxycodone was reduced in quantity due to the inconsistent UDS  F/U PCP Dr Garnette Gunner for evaliation of  BP and general medical  condition.  F/U surgical evaluation Dr. Tamera Punt as discussed for evaluation of shoulder  F/U neurological evaluation  Psych evaluation. We schedule patient for psych evaluation due to inconsistent UDS. The patient was understanding and agreed with proceeding with psych evaluation.  UDS was repeated today due to the inconsistent UDS previously obtained  May consider radiofrequency rhizolysis or intraspinal procedures pending response to present treatment and F/U evaluation. We will verify patient's approval for radiofrequency rhizolysis to be performed for patient's lumbar lower  extremity pain and we will  proceed with radiofrequency rhizolysis pending follow-up evaluation of patient as discussed  [facility] should patient have concerns prior to scheduled return appointment

## 2014-09-13 NOTE — Progress Notes (Signed)
Safety precautions to be maintained throughout the outpatient stay will include: orient to surroundings, keep bed in low position, maintain call bell within reach at all times, provide assistance with transfer out of bed and ambulation.  

## 2014-09-13 NOTE — Patient Instructions (Addendum)
Continue present medications Zanaflex Ambien and oxycodone   As discussed we will proceed with radiofrequency rhizolysis when you decide to proceed with the procedure  F/U PCP Dr Garnette Gunner for evaliation of  BP and general medical  condition.  F/U surgical evaluation Dr. Tamera Punt as well as neurosurgical evaluation  Psych evaluation. Please see psych as discussed  F/U neurological evaluation  May consider radiofrequency rhizolysis or intraspinal procedures pending response to present treatment and F/U evaluation.  Patient to call [facility] should patient have concerns prior to scheduled return appointment. GENERAL RISKS AND COMPLICATIONS  What are the risk, side effects and possible complications? Generally speaking, most procedures are safe.  However, with any procedure there are risks, side effects, and the possibility of complications.  The risks and complications are dependent upon the sites that are lesioned, or the type of nerve block to be performed.  The closer the procedure is to the spine, the more serious the risks are.  Great care is taken when placing the radio frequency needles, block needles or lesioning probes, but sometimes complications can occur. 1. Infection: Any time there is an injection through the skin, there is a risk of infection.  This is why sterile conditions are used for these blocks.  There are four possible types of infection. 1. Localized skin infection. 2. Central Nervous System Infection-This can be in the form of Meningitis, which can be deadly. 3. Epidural Infections-This can be in the form of an epidural abscess, which can cause pressure inside of the spine, causing compression of the spinal cord with subsequent paralysis. This would require an emergency surgery to decompress, and there are no guarantees that the patient would recover from the paralysis. 4. Discitis-This is an infection of the intervertebral discs.  It occurs in about 1% of  discography procedures.  It is difficult to treat and it may lead to surgery.        2. Pain: the needles have to go through skin and soft tissues, will cause soreness.       3. Damage to internal structures:  The nerves to be lesioned may be near blood vessels or    other nerves which can be potentially damaged.       4. Bleeding: Bleeding is more common if the patient is taking blood thinners such as  aspirin, Coumadin, Ticiid, Plavix, etc., or if he/she have some genetic predisposition  such as hemophilia. Bleeding into the spinal canal can cause compression of the spinal  cord with subsequent paralysis.  This would require an emergency surgery to  decompress and there are no guarantees that the patient would recover from the  paralysis.       5. Pneumothorax:  Puncturing of a lung is a possibility, every time a needle is introduced in  the area of the chest or upper back.  Pneumothorax refers to free air around the  collapsed lung(s), inside of the thoracic cavity (chest cavity).  Another two possible  complications related to a similar event would include: Hemothorax and Chylothorax.   These are variations of the Pneumothorax, where instead of air around the collapsed  lung(s), you may have blood or chyle, respectively.       6. Spinal headaches: They may occur with any procedures in the area of the spine.       7. Persistent CSF (Cerebro-Spinal Fluid) leakage: This is a rare problem, but may occur  with prolonged intrathecal or epidural catheters either due to the  formation of a fistulous  track or a dural tear.       8. Nerve damage: By working so close to the spinal cord, there is always a possibility of  nerve damage, which could be as serious as a permanent spinal cord injury with  paralysis.       9. Death:  Although rare, severe deadly allergic reactions known as "Anaphylactic  reaction" can occur to any of the medications used.      10. Worsening of the symptoms:  We can always make thing  worse.  What are the chances of something like this happening? Chances of any of this occuring are extremely low.  By statistics, you have more of a chance of getting killed in a motor vehicle accident: while driving to the hospital than any of the above occurring .  Nevertheless, you should be aware that they are possibilities.  In general, it is similar to taking a shower.  Everybody knows that you can slip, hit your head and get killed.  Does that mean that you should not shower again?  Nevertheless always keep in mind that statistics do not mean anything if you happen to be on the wrong side of them.  Even if a procedure has a 1 (one) in a 1,000,000 (million) chance of going wrong, it you happen to be that one..Also, keep in mind that by statistics, you have more of a chance of having something go wrong when taking medications.  Who should not have this procedure? If you are on a blood thinning medication (e.g. Coumadin, Plavix, see list of "Blood Thinners"), or if you have an active infection going on, you should not have the procedure.  If you are taking any blood thinners, please inform your physician.  How should I prepare for this procedure?  Do not eat or drink anything at least six hours prior to the procedure.  Bring a driver with you .  It cannot be a taxi.  Come accompanied by an adult that can drive you back, and that is strong enough to help you if your legs get weak or numb from the local anesthetic.  Take all of your medicines the morning of the procedure with just enough water to swallow them.  If you have diabetes, make sure that you are scheduled to have your procedure done first thing in the morning, whenever possible.  If you have diabetes, take only half of your insulin dose and notify our nurse that you have done so as soon as you arrive at the clinic.  If you are diabetic, but only take blood sugar pills (oral hypoglycemic), then do not take them on the morning of your  procedure.  You may take them after you have had the procedure.  Do not take aspirin or any aspirin-containing medications, at least eleven (11) days prior to the procedure.  They may prolong bleeding.  Wear loose fitting clothing that may be easy to take off and that you would not mind if it got stained with Betadine or blood.  Do not wear any jewelry or perfume  Remove any nail coloring.  It will interfere with some of our monitoring equipment.  NOTE: Remember that this is not meant to be interpreted as a complete list of all possible complications.  Unforeseen problems may occur.  BLOOD THINNERS The following drugs contain aspirin or other products, which can cause increased bleeding during surgery and should not be taken for 2 weeks prior to  and 1 week after surgery.  If you should need take something for relief of minor pain, you may take acetaminophen which is found in Tylenol,m Datril, Anacin-3 and Panadol. It is not blood thinner. The products listed below are.  Do not take any of the products listed below in addition to any listed on your instruction sheet.  A.P.C or A.P.C with Codeine Codeine Phosphate Capsules #3 Ibuprofen Ridaura  ABC compound Congesprin Imuran rimadil  Advil Cope Indocin Robaxisal  Alka-Seltzer Effervescent Pain Reliever and Antacid Coricidin or Coricidin-D  Indomethacin Rufen  Alka-Seltzer plus Cold Medicine Cosprin Ketoprofen S-A-C Tablets  Anacin Analgesic Tablets or Capsules Coumadin Korlgesic Salflex  Anacin Extra Strength Analgesic tablets or capsules CP-2 Tablets Lanoril Salicylate  Anaprox Cuprimine Capsules Levenox Salocol  Anexsia-D Dalteparin Magan Salsalate  Anodynos Darvon compound Magnesium Salicylate Sine-off  Ansaid Dasin Capsules Magsal Sodium Salicylate  Anturane Depen Capsules Marnal Soma  APF Arthritis pain formula Dewitt's Pills Measurin Stanback  Argesic Dia-Gesic Meclofenamic Sulfinpyrazone  Arthritis Bayer Timed Release Aspirin  Diclofenac Meclomen Sulindac  Arthritis pain formula Anacin Dicumarol Medipren Supac  Analgesic (Safety coated) Arthralgen Diffunasal Mefanamic Suprofen  Arthritis Strength Bufferin Dihydrocodeine Mepro Compound Suprol  Arthropan liquid Dopirydamole Methcarbomol with Aspirin Synalgos  ASA tablets/Enseals Disalcid Micrainin Tagament  Ascriptin Doan's Midol Talwin  Ascriptin A/D Dolene Mobidin Tanderil  Ascriptin Extra Strength Dolobid Moblgesic Ticlid  Ascriptin with Codeine Doloprin or Doloprin with Codeine Momentum Tolectin  Asperbuf Duoprin Mono-gesic Trendar  Aspergum Duradyne Motrin or Motrin IB Triminicin  Aspirin plain, buffered or enteric coated Durasal Myochrisine Trigesic  Aspirin Suppositories Easprin Nalfon Trillsate  Aspirin with Codeine Ecotrin Regular or Extra Strength Naprosyn Uracel  Atromid-S Efficin Naproxen Ursinus  Auranofin Capsules Elmiron Neocylate Vanquish  Axotal Emagrin Norgesic Verin  Azathioprine Empirin or Empirin with Codeine Normiflo Vitamin E  Azolid Emprazil Nuprin Voltaren  Bayer Aspirin plain, buffered or children's or timed BC Tablets or powders Encaprin Orgaran Warfarin Sodium  Buff-a-Comp Enoxaparin Orudis Zorpin  Buff-a-Comp with Codeine Equegesic Os-Cal-Gesic   Buffaprin Excedrin plain, buffered or Extra Strength Oxalid   Bufferin Arthritis Strength Feldene Oxphenbutazone   Bufferin plain or Extra Strength Feldene Capsules Oxycodone with Aspirin   Bufferin with Codeine Fenoprofen Fenoprofen Pabalate or Pabalate-SF   Buffets II Flogesic Panagesic   Buffinol plain or Extra Strength Florinal or Florinal with Codeine Panwarfarin   Buf-Tabs Flurbiprofen Penicillamine   Butalbital Compound Four-way cold tablets Penicillin   Butazolidin Fragmin Pepto-Bismol   Carbenicillin Geminisyn Percodan   Carna Arthritis Reliever Geopen Persantine   Carprofen Gold's salt Persistin   Chloramphenicol Goody's Phenylbutazone   Chloromycetin Haltrain Piroxlcam    Clmetidine heparin Plaquenil   Cllnoril Hyco-pap Ponstel   Clofibrate Hydroxy chloroquine Propoxyphen         Before stopping any of these medications, be sure to consult the physician who ordered them.  Some, such as Coumadin (Warfarin) are ordered to prevent or treat serious conditions such as "deep thrombosis", "pumonary embolisms", and other heart problems.  The amount of time that you may need off of the medication may also vary with the medication and the reason for which you were taking it.  If you are taking any of these medications, please make sure you notify your pain physician before you undergo any procedures.         Radiofrequency Lesioning Care After Refer to this sheet in the next few weeks. These instructions provide you with information on caring for yourself after your procedure. Your caregiver  may also give you more specific instructions. Your treatment has been planned according to current medical practices, but problems sometimes occur. Call your caregiver if you have any problems or questions after your procedure. HOME CARE INSTRUCTIONS 2. Take pain medicine as directed by your caregiver. 3. You may have pain from the burned nerve for a while after the procedure. This takes time to heal. Ask your caregiver how long you should expect pain. 4. You should be able to return to your normal activities a couple of days after the procedure. When you can go back to work will depend on the type of work you do. Discuss this with your caregiver. 5. Pay close attention to how you feel after the procedure. If you start having pain, write down when it hurts and how it feels. This will help you and your caregiver know if you need another treatment. SEEK MEDICAL CARE IF:  The site where the needle was inserted for the procedure becomes red, swollen, or tender to touch.  Your pain does not get better. SEEK IMMEDIATE MEDICAL CARE IF:  You develop sudden, severe pain.  You  develop numbness or tingling near the procedure site.  You have a fever. MAKE SURE YOU:  Understand these instructions.  Will watch your condition.  Will get help right away if you are not doing well or get worse. Document Released: 09/26/2010 Document Revised: 04/22/2011 Document Reviewed: 09/26/2010 [facility] Patient Information 2015 [place], [place]. This information is not intended to replace advice given to you by your health care provider. Make sure you discuss any questions you have with your health care provider. Radiofrequency Lesioning Radiofrequency lesioning is a procedure to relieve pain. The procedure is often used for back, neck, or arm pain. Radiofrequency lesioning uses a specialized machine that creates radio waves to make heat. The heat damages the nerve that carries the pain signal. Pain relief usually lasts 6 months to 1 year.  LET YOUR CAREGIVER KNOW ABOUT: 6. Allergies to food or medicine. 7. Medicines taken, including vitamins, herbs, eyedrops, over-the-counter medicines, and creams. 8. Use of steroids (by mouth or creams). 9. Previous problems with anesthetics or numbing medicines. 10. History of bleeding problems or blood clots. 11. Previous surgery. 12. Other health problems, including diabetes and kidney problems. 13. Possibility of pregnancy, if this applies. 14. Breathing problems and smoking history. 15. Any recent colds or infections. RISKS AND COMPLICATIONS This procedure is generally safe. The risks and complications depend on what treatment site is used. General complications may include:  Pain or soreness at the injection site.  Infection at the injection site.  Damage to nerves or blood vessels. BEFORE THE PROCEDURE  Ask your caregiver about changing or stopping any medicines you are on before the procedure.  If you take blood thinners, ask if you should stop taking them before the procedure.  You may be asked to wash with an antibiotic soap  before the procedure.  Do not eat or drink for 8 hours before your procedure or as told by your caregiver.  Ask your caregiver what time you need to arrive for your procedure.  This is an outpatient procedure. This means you will be able to go home the same day. Make plans for someone to drive you home. PROCEDURE  You will be awake during the procedure. You need to be able to talk to the surgeon during the procedure. However, you might be given medicine to help you relax (sedative).  Medicine to numb the area (  local anesthetic) will be injected.  With the help of a type of X-ray (fluoroscopy), a radio frequency needle will be inserted into the area to be treated. Then, a wire that carries the radio waves (electrode) will be put through the radio frequency needle. An electrical pulse will be sent through the electrode to verify the correct nerve.  You will feel a tingling sensation similar to hitting your "funny bone." You may also have muscle twitching. The tissue around the needle tip is then heated when electric current is passed using the radio frequency machine. This numbs the nerves.  A bandage (dressing) will be put on the area after the procedure is done. AFTER THE PROCEDURE 12. You will stay in a recovery area until you are awake enough to eat and drink. 13. Once everything is back to normal, you will be able to go home. 14. You will need to arrange for someone to drive you home if you received a sedative or pain relieving medicine during the procedure. Document Released: 09/26/2010 Document Revised: 04/22/2011 Document Reviewed: 09/26/2010 [facility] Patient Information 2015 [place], [place]. This information is not intended to replace advice given to you by your health care provider. Make sure you discuss any questions you have with your health care provider.

## 2014-09-15 ENCOUNTER — Encounter: Payer: Self-pay | Admitting: Internal Medicine

## 2014-09-15 ENCOUNTER — Ambulatory Visit (INDEPENDENT_AMBULATORY_CARE_PROVIDER_SITE_OTHER): Payer: BLUE CROSS/BLUE SHIELD | Admitting: Internal Medicine

## 2014-09-15 VITALS — BP 134/78 | HR 67 | Temp 97.9°F | Ht 68.33 in | Wt 188.0 lb

## 2014-09-15 DIAGNOSIS — Z125 Encounter for screening for malignant neoplasm of prostate: Secondary | ICD-10-CM | POA: Diagnosis not present

## 2014-09-15 DIAGNOSIS — Z0000 Encounter for general adult medical examination without abnormal findings: Secondary | ICD-10-CM

## 2014-09-15 DIAGNOSIS — J302 Other seasonal allergic rhinitis: Secondary | ICD-10-CM

## 2014-09-15 LAB — LIPID PANEL
CHOLESTEROL: 143 mg/dL (ref 0–200)
HDL: 36.3 mg/dL — ABNORMAL LOW (ref >39.00)
LDL Cholesterol: 75 mg/dL (ref 0–99)
NonHDL: 107.15
Total CHOL/HDL Ratio: 4
Triglycerides: 161 mg/dL — ABNORMAL HIGH (ref 0.0–149.0)
VLDL: 32.2 mg/dL (ref 0.0–40.0)

## 2014-09-15 LAB — PSA: PSA: 0.4 ng/mL (ref 0.10–4.00)

## 2014-09-15 NOTE — Patient Instructions (Signed)

## 2014-09-15 NOTE — Progress Notes (Signed)
Pre visit review using our clinic review tool, if applicable. No additional management support is needed unless otherwise documented below in the visit note. 

## 2014-09-15 NOTE — Progress Notes (Signed)
Subjective:    Patient ID: Derrick Kelley, male    DOB: Jun 17, 1960, 54 y.o.   MRN: [id number]  HPI  Pt presents to the clinic today for his annual exam.  Flu: 09/2013 Tetanus: 09/2013 PSA Screening: 03/2013 Colon Screening: 03/2013 by Dr. Paulita Fujita Vision Screening: yearly Dentist: yearly  Diet: He eats whatever he wants. He tries to avoid fried foods. He does consume fruits and veggies daily. He drinks 1-2 glasses daily. He drinks coffee on a daily basis. Exercise: he is not currently exercising  He does have some concerns about facial pain and pressure, runny nose, and scratchy throat. This started 1 week ago. He has had some associated itchy eyes and ear fullness. He has not noticed any redness or drainage from the eyes. He denies fever, chills or body aches. He has taken Allegra and Benadryl intermittently without any relief. He has no history of seasonal allergies. He has not had sick contacts.  Review of Systems      Past Medical History  Diagnosis Date  . BACK PAIN, CHRONIC 05/11/2009    bone on bone;hx of herniated disc  . Flushing 05/11/2009  . HYPERLIPIDEMIA     takes Lipitor daily  . HYPOGONADISM 05/11/2009    takes Testosterone every 14days  . INSOMNIA-SLEEP DISORDER-UNSPEC 10/10/2009    takes Ambien nightly  . POLYCYTHEMIA 05/11/2009  . SCIATICA, RIGHT 09/20/2009  . SINUSITIS- ACUTE-NOS 10/10/2009  . URI 05/11/2009  . Arthritis   . Chronic back pain     hx buldging disc;  . Hemorrhoids     hx of  . HYPERTENSION     takes Avapro,Amlodipine,and Lisinopril daily  . Headache(784.0) 10/10/2009    cluster;last one about a yr ago  . Joint pain   . Joint pain   . GERD 05/11/2009    but doesn't require meds  . DEPRESSION     takes Zoloft daily  . Sleep apnea     use CPAP  . Hypercholesteremia     Current Outpatient Prescriptions  Medication Sig Dispense Refill  . amLODipine (NORVASC) 10 MG tablet Take 1 tablet (10 mg total) by mouth daily. NO MORE REFILLS PLEASE  CALL TO SCHEDULE ANNUAL OFFICE VISIT 30 tablet 1  . atorvastatin (LIPITOR) 20 MG tablet Take 1 tablet (20 mg total) by mouth daily. NO MORE REFILLS PLEASE CALL TO SCHEDULE ANNUAL OFFICE VISIT 30 tablet 1  . irbesartan (AVAPRO) 150 MG tablet Take 1 tablet (150 mg total) by mouth daily. NEED TO SCHEDULE ANNUAL PHYSICAL [phone] 30 tablet 1  . oxycodone (OXY-IR) 5 MG capsule Limit 3 tablets by mouth daily if tolerated 90 capsule 0  . pantoprazole (PROTONIX) 40 MG tablet Take 40 mg by mouth daily.    . sertraline (ZOLOFT) 100 MG tablet Take 2 tablets (200 mg total) by mouth daily. 60 tablet 1  . tamsulosin (FLOMAX) 0.4 MG CAPS capsule Take 0.4 mg by mouth daily.    Marland Kitchen testosterone cypionate (DEPOTESTOTERONE CYPIONATE) 200 MG/ML injection Inject 1 mL (200 mg total) into the muscle every 14 (fourteen) days. 1 cc biweekly IM (self Injected)  Last injection was on 04/12/11 10 mL 5  . tiZANidine (ZANAFLEX) 4 MG tablet Limited 1 tablet by mouth 2-4 times per day if tolerated 120 tablet 2  . traZODone (DESYREL) 100 MG tablet TAKE ONE TABLET BY MOUTH AT BEDTIME (Patient not taking: Reported on 09/13/2014) 30 tablet 0  . zolpidem (AMBIEN) 10 MG tablet Limit 1 tablet by mouth at bedtime  when necessary for insomnia 30 tablet 0   No current facility-administered medications for this visit.    Allergies  Allergen Reactions  . Bupropion Hcl     REACTION: nightmares  . Codeine Hives    Family History  Problem Relation Age of Onset  . Alcohol abuse Father   . Cancer Father     prostate  . Alcohol abuse Maternal Aunt   . Alcohol abuse Maternal Uncle   . Cancer Paternal Uncle     prostate  . Cancer Maternal Grandmother     ovarian cancer  . Alcohol abuse Other   . Anesthesia problems Neg Hx   . Hypotension Neg Hx   . Malignant hyperthermia Neg Hx   . Pseudochol deficiency Neg Hx     History   Social History  . Marital Status: Married    Spouse Name: N/A  . Number of Children: 2  . Years of  Education: N/A   Occupational History  . unemployed    Social History Main Topics  . Smoking status: Never Smoker   . Smokeless tobacco: Never Used  . Alcohol Use: 3.0 oz/week    5 Standard drinks or equivalent per week     Comment: occasional  . Drug Use: No  . Sexual Activity: Yes   Other Topics Concern  . Not on file   Social History Narrative     Constitutional: Pt reports headache. Denies fever, malaise, fatigue, or abrupt weight changes.  HEENT: Pt reports ear fullness, runny nose and sore throat. Denies eye pain, eye redness, ear pain, ringing in the ears, wax buildup, nasal congestion, bloody noset. Respiratory: Denies difficulty breathing, shortness of breath, cough or sputum production.   Cardiovascular: Denies chest pain, chest tightness, palpitations or swelling in the hands or feet.  Gastrointestinal: Pt reports LLQ abdominal pain. Denies bloating, constipation, diarrhea or blood in the stool.  GU: Denies urgency, frequency, pain with urination, burning sensation, blood in urine, odor or discharge. Musculoskeletal: Pt reports back pain. Denies decrease in range of motion, difficulty with gait swelling.  Skin: Denies redness, rashes, lesions or ulcercations.  Neurological: Denies dizziness, difficulty with memory, difficulty with speech or problems with balance and coordination.  Psych: Pt has history of anxiety and depression. Denies SI/HI.  No other specific complaints in a complete review of systems (except as listed in HPI above).  Objective:   Physical Exam   BP 134/78 mmHg  Pulse 67  Temp(Src) 97.9 F (36.6 C) (Oral)  Ht 5' 8.33" (1.736 m)  Wt 188 lb (85.276 kg)  BMI 28.30 kg/m2  SpO2 97% Wt Readings from Last 3 Encounters:  09/15/14 188 lb (85.276 kg)  09/13/14 185 lb (83.915 kg)  09/06/14 192 lb (87.091 kg)    General: Appears his stated age, well developed, well nourished in NAD. Skin: Warm, dry and intact. He has had obvious sun  exposure. HEENT: Head: normal shape and size, mild maxillary sinus tenderness; Eyes: sclera white, no icterus, conjunctiva pink, PERRLA and EOMs intact; Ears: Tm's gray and intact, normal light reflex; Nose: mucosa pink and moist, septum midline; Throat/Mouth: Teeth present, mucosa pink and moist, + PND, no exudate, lesions or ulcerations noted.  Neck: Neck supple, trachea midline. No masses, lumps or thyromegaly present.  Cardiovascular: Normal rate and rhythm. S1,S2 noted.  No murmur, rubs or gallops noted. No JVD or BLE edema. No carotid bruits noted. Pulmonary/Chest: Normal effort and positive vesicular breath sounds. No respiratory distress. No wheezes, rales or ronchi  noted.  Abdomen: Soft and nontender. Normal bowel sounds, no bruits noted. No distention or masses noted. Liver, spleen and kidneys non palpable. Musculoskeletal: Normal range of motion. Strength 5/5 BUE/BLE. No difficulty with gait.  Neurological: Alert and oriented. Cranial nerves II-XII grossly intact. Coordination normal.  Psychiatric: Mood and affect normal. Behavior is normal. Judgment and thought content normal.    BMET    Component Value Date/Time   NA 138 09/06/2014 1146   K 4.3 09/06/2014 1146   CL 103 09/06/2014 1146   CO2 30 09/06/2014 1146   GLUCOSE 80 09/06/2014 1146   BUN 19 09/06/2014 1146   CREATININE 1.45 09/06/2014 1146   CALCIUM 9.6 09/06/2014 1146   GFRNONAA 78* 01/19/2014 1417   GFRAA >90 01/19/2014 1417    Lipid Panel     Component Value Date/Time   CHOL 154 09/30/2013 1153   TRIG 143.0 09/30/2013 1153   HDL 40.60 09/30/2013 1153   CHOLHDL 4 09/30/2013 1153   VLDL 28.6 09/30/2013 1153   LDLCALC 85 09/30/2013 1153    CBC    Component Value Date/Time   WBC 7.4 09/06/2014 1146   WBC 7.4 01/02/2010 1416   RBC 5.52 09/06/2014 1146   RBC 4.86 01/02/2010 1416   HGB 17.2* 09/06/2014 1146   HGB Unable to Determine 01/02/2010 1416   HCT 50.1 09/06/2014 1146   HCT 41.2 01/02/2010 1416    PLT 190.0 09/06/2014 1146   PLT 208 01/02/2010 1416   MCV 90.7 09/06/2014 1146   MCV 84.8 01/02/2010 1416   MCH 32.8 01/28/2014 0500   MCH Unable to determine 01/02/2010 1416   MCHC 34.3 09/06/2014 1146   MCHC Unable to determine 01/02/2010 1416   RDW 12.9 09/06/2014 1146   RDW 12.9 01/02/2010 1416   LYMPHSABS 0.9 01/19/2014 1417   LYMPHSABS 1.6 01/02/2010 1416   MONOABS 0.7 01/19/2014 1417   MONOABS 0.4 01/02/2010 1416   EOSABS 0.1 01/19/2014 1417   EOSABS 0.1 01/02/2010 1416   BASOSABS 0.0 01/19/2014 1417   BASOSABS 0.0 01/02/2010 1416    Hgb A1C No results found for: HGBA1C      Assessment & Plan:   Preventative Health Maintenance:  Encouraged him to work on diet and exercise Encouraged him to continue to see an eye doctor and dentist at least annually Will check Lipid and PSA today CMET and CBC from last month reviewed Will request records from Dr. Paulita Fujita to get a copy of colonoscopy results  Allergies:  Take Allegra daily Flonase daily may also help  RTC in 6 months to follow up chronic condtions

## 2014-09-22 ENCOUNTER — Other Ambulatory Visit: Payer: Self-pay | Admitting: Pain Medicine

## 2014-09-27 ENCOUNTER — Telehealth: Payer: Self-pay | Admitting: Pain Medicine

## 2014-09-27 NOTE — Telephone Encounter (Signed)
Has his RF been approved ? If so when can he have it done

## 2014-10-11 ENCOUNTER — Encounter: Payer: Self-pay | Admitting: Pain Medicine

## 2014-10-11 ENCOUNTER — Ambulatory Visit: Payer: BLUE CROSS/BLUE SHIELD | Attending: Pain Medicine | Admitting: Pain Medicine

## 2014-10-11 ENCOUNTER — Other Ambulatory Visit: Payer: Self-pay | Admitting: Pain Medicine

## 2014-10-11 VITALS — BP 143/95 | HR 62 | Temp 97.2°F | Resp 18 | Ht 68.0 in | Wt 185.0 lb

## 2014-10-11 DIAGNOSIS — M19012 Primary osteoarthritis, left shoulder: Secondary | ICD-10-CM | POA: Insufficient documentation

## 2014-10-11 DIAGNOSIS — M47817 Spondylosis without myelopathy or radiculopathy, lumbosacral region: Secondary | ICD-10-CM

## 2014-10-11 DIAGNOSIS — M47816 Spondylosis without myelopathy or radiculopathy, lumbar region: Secondary | ICD-10-CM | POA: Insufficient documentation

## 2014-10-11 DIAGNOSIS — M47818 Spondylosis without myelopathy or radiculopathy, sacral and sacrococcygeal region: Secondary | ICD-10-CM

## 2014-10-11 DIAGNOSIS — M533 Sacrococcygeal disorders, not elsewhere classified: Secondary | ICD-10-CM | POA: Insufficient documentation

## 2014-10-11 DIAGNOSIS — M5136 Other intervertebral disc degeneration, lumbar region: Secondary | ICD-10-CM | POA: Insufficient documentation

## 2014-10-11 DIAGNOSIS — Z9889 Other specified postprocedural states: Secondary | ICD-10-CM | POA: Diagnosis not present

## 2014-10-11 DIAGNOSIS — M79604 Pain in right leg: Secondary | ICD-10-CM | POA: Diagnosis present

## 2014-10-11 DIAGNOSIS — Z981 Arthrodesis status: Secondary | ICD-10-CM | POA: Diagnosis not present

## 2014-10-11 DIAGNOSIS — M961 Postlaminectomy syndrome, not elsewhere classified: Secondary | ICD-10-CM

## 2014-10-11 DIAGNOSIS — M19011 Primary osteoarthritis, right shoulder: Secondary | ICD-10-CM | POA: Diagnosis not present

## 2014-10-11 DIAGNOSIS — M79605 Pain in left leg: Secondary | ICD-10-CM | POA: Diagnosis present

## 2014-10-11 DIAGNOSIS — M545 Low back pain: Secondary | ICD-10-CM | POA: Diagnosis present

## 2014-10-11 DIAGNOSIS — M461 Sacroiliitis, not elsewhere classified: Secondary | ICD-10-CM

## 2014-10-11 MED ORDER — OXYCODONE HCL 5 MG PO CAPS: ORAL_CAPSULE | ORAL | Status: DC

## 2014-10-11 MED ORDER — ZOLPIDEM TARTRATE 10 MG PO TABS: ORAL_TABLET | ORAL | Status: DC

## 2014-10-11 NOTE — Patient Instructions (Addendum)
Continue present medications Zanaflex Ambien and oxycodone  Radiofrequency rhizolysis lumbar facet medial branch nerves to be performed at time return appointment  F/U PCP for evaliation of  BP and general medical  condition  F/U surgical evaluation  F/U neurological evaluation  May consider radiofrequency rhizolysis or intraspinal procedures pending response to present treatment and F/U evaluation   [facility] to [facility] should patient have concerns prior to scheduled return appointment. Radiofrequency Lesioning Radiofrequency lesioning is a procedure to relieve pain. The procedure is often used for back, neck, or arm pain. Radiofrequency lesioning uses a specialized machine that creates radio waves to make heat. The heat damages the nerve that carries the pain signal. Pain relief usually lasts 6 months to 1 year.  LET YOUR CAREGIVER KNOW ABOUT:  Allergies to food or medicine.  Medicines taken, including vitamins, herbs, eyedrops, over-the-counter medicines, and creams.  Use of steroids (by mouth or creams).  Previous problems with anesthetics or numbing medicines.  History of bleeding problems or blood clots.  Previous surgery.  Other health problems, including diabetes and kidney problems.  Possibility of pregnancy, if this applies.  Breathing problems and smoking history.  Any recent colds or infections. RISKS AND COMPLICATIONS This procedure is generally safe. The risks and complications depend on what treatment site is used. General complications may include:  Pain or soreness at the injection site.  Infection at the injection site.  Damage to nerves or blood vessels. BEFORE THE PROCEDURE  Ask your caregiver about changing or stopping any medicines you are on before the procedure.  If you take blood thinners, ask if you should stop taking them before the procedure.  You may be asked to wash with an antibiotic soap before the procedure.  Do not  eat or drink for 8 hours before your procedure or as told by your caregiver.  Ask your caregiver what time you need to arrive for your procedure.  This is an outpatient procedure. This means you will be able to go home the same day. Make plans for someone to drive you home. PROCEDURE  You will be awake during the procedure. You need to be able to talk to the surgeon during the procedure. However, you might be given medicine to help you relax (sedative).  Medicine to numb the area (local anesthetic) will be injected.  With the help of a type of X-ray (fluoroscopy), a radio frequency needle will be inserted into the area to be treated. Then, a wire that carries the radio waves (electrode) will be put through the radio frequency needle. An electrical pulse will be sent through the electrode to verify the correct nerve.  You will feel a tingling sensation similar to hitting your "funny bone." You may also have muscle twitching. The tissue around the needle tip is then heated when electric current is passed using the radio frequency machine. This numbs the nerves.  A bandage (dressing) will be put on the area after the procedure is done. AFTER THE PROCEDURE  You will stay in a recovery area until you are awake enough to eat and drink.  Once everything is back to normal, you will be able to go home.  You will need to arrange for someone to drive you home if you received a sedative or pain relieving medicine during the procedure. Document Released: 09/26/2010 Document Revised: 04/22/2011 Document Reviewed: 09/26/2010 [facility] Patient Information 2015 [place], [place]. This information is not intended to replace advice given to you by your health  care provider. Make sure you discuss any questions you have with your health care provider.

## 2014-10-11 NOTE — Progress Notes (Signed)
Safety precautions to be maintained throughout the outpatient stay will include: orient to surroundings, keep bed in low position, maintain call bell within reach at all times, provide assistance with transfer out of bed and ambulation.  

## 2014-10-11 NOTE — Progress Notes (Signed)
   Subjective:    Patient ID: Derrick Kelley, male    DOB: [birth year], 54 y.o.   MRN: [id number]  HPI  Patient is 54 year old gentleman [facility] evaluation and treatment of pain involving the lower back and lower extremity regions. Patient states that his pain radiates across the back continues to the buttocks and is aggravated with standing walking twisting turning maneuvers. Patient denies any recent trauma. Patient does admit to working on with to help his father-in-law. Patient states that he had 2 hip repair with and that this has caused some exacerbation of his pain. We will proceed with interventional treatment consisting of radiofrequency rhizolysis lumbar facet, medial branch nerves, pending insurance approval. The patient has had significant improvement following previous interventional treatment and wishes to proceed with radiofrequency rhizolysis at this time. All understanding and in agreement status treatment plan. Patient will follow-up with Dr. Tamera Punt for evaluation of shoulder and we will continue patient's medications Zanaflex and Ambien and oxycodone    Review of Systems     Objective:   Physical Exam There was tenderness of the splenius capitis of talus region of moderate degree. There was tennis of the acromial clavicular and glenohumeral joint region of moderately severe degree. Patient appeared to be with unremarkable Spurling's maneuver. There was limited range of motion of the acromioclavicular joint region. Patient was with evidence of slightly decreased grip strength. Tinel and Phalen's maneuver were without definite increase of pain. There was tenderness of thoracic facet thoracic paraspinal musculature region palpation which reproduces moderate discomfort. No crepitus of the thoracic region was noted. Palpation over the lumbar paraspinal muscles region lumbar facet region associated with moderate to moderately severe discomfort.  Lateral bending and rotation associated with moderately severe discomfort. Moderately severe tenderness of the PSIS PII S region as well as the gluteal and piriformis musculature regions. Mild tends on the greater trochanteric region and iliotibial band region. Straight leg raising was tolerates approximately 30 without an increase of pain with dorsiflexion noted. There was negative clonus negative Homans. Palpation of the lumbar facets reproduce predominant portion of patient's pain. There was negative clonus negative Homans. Abdomen was nontender and no costovertebral tenderness was noted.       Assessment & Plan:    Degenerative disc disease lumbar spine Status post L4-L5 posterior lumbar interbody fusion and decompression of the central canal with solid fusion L5-S1, L3-4 degenerative disc disease, moderate facet arthrosis.  Lumbar facet syndrome  Sacroiliac joint dysfunction  Degenerative joint disease of shoulders Status post surgery of shoulder   Plan   Continue present medications Zanaflex  Ambien and oxycodone  Radiofrequency rhizolysis lumbar facets, medial branch nerves, to be performed at time return appointment  F/U PCP R Baity  for evaliation of  BP and general medical  condition  F/U surgical evaluation with Dr. Tamera Punt as discussed. We will also consider neurosurgical evaluation of lumbar and lower extremity region   F/U neurological evaluation  Radiofrequency rhizolysis of the lumbar facets, medial branch nerves, to be performed at time return appointment as discussed  Patient to call [facility] should patient have concerns prior to scheduled return appointment.

## 2014-10-13 ENCOUNTER — Encounter: Payer: BLUE CROSS/BLUE SHIELD | Admitting: Pain Medicine

## 2014-10-19 ENCOUNTER — Encounter: Payer: Self-pay | Admitting: Internal Medicine

## 2014-10-19 ENCOUNTER — Ambulatory Visit (INDEPENDENT_AMBULATORY_CARE_PROVIDER_SITE_OTHER): Payer: BLUE CROSS/BLUE SHIELD | Admitting: Internal Medicine

## 2014-10-19 VITALS — BP 148/90 | HR 76 | Temp 98.2°F | Wt 179.0 lb

## 2014-10-19 DIAGNOSIS — J340 Abscess, furuncle and carbuncle of nose: Secondary | ICD-10-CM | POA: Diagnosis not present

## 2014-10-19 MED ORDER — SULFAMETHOXAZOLE-TRIMETHOPRIM 800-160 MG PO TABS: 1.0000 | ORAL_TABLET | Freq: Two times a day (BID) | ORAL | Status: DC

## 2014-10-19 NOTE — Progress Notes (Signed)
Subjective:    Patient ID: Derrick Kelley, male    DOB: 1960/07/10, 54 y.o.   MRN: [id number]  HPI  Pt presents to the clinic today with c/o nasal swelling and pain. This started 4-5 days ago. He noticed it shortly after cutting his grass. His nose has been runny. He is blowing clearing mucous out of his nose. He has some soreness behind his eyes but denies facial pain or pressure. He denies ear pain, sore throat or cough. He feels like there is a pimple in his nose. He denies fever, chills or body aches. He has tried Claritin without any relief. He has had sinus infections in the past but reports this feels different. He has not had sick contacts.  Review of Systems      Past Medical History  Diagnosis Date  . BACK PAIN, CHRONIC 05/11/2009    bone on bone;hx of herniated disc  . Flushing 05/11/2009  . HYPERLIPIDEMIA     takes Lipitor daily  . HYPOGONADISM 05/11/2009    takes Testosterone every 14days  . INSOMNIA-SLEEP DISORDER-UNSPEC 10/10/2009    takes Ambien nightly  . POLYCYTHEMIA 05/11/2009  . SCIATICA, RIGHT 09/20/2009  . SINUSITIS- ACUTE-NOS 10/10/2009  . URI 05/11/2009  . Arthritis   . Chronic back pain     hx buldging disc;  . Hemorrhoids     hx of  . HYPERTENSION     takes Avapro,Amlodipine,and Lisinopril daily  . Headache(784.0) 10/10/2009    cluster;last one about a yr ago  . Joint pain   . Joint pain   . GERD 05/11/2009    but doesn't require meds  . DEPRESSION     takes Zoloft daily  . Sleep apnea     use CPAP  . Hypercholesteremia     Current Outpatient Prescriptions  Medication Sig Dispense Refill  . amLODipine (NORVASC) 10 MG tablet Take 1 tablet (10 mg total) by mouth daily. NO MORE REFILLS PLEASE CALL TO SCHEDULE ANNUAL OFFICE VISIT 30 tablet 1  . atorvastatin (LIPITOR) 20 MG tablet Take 1 tablet (20 mg total) by mouth daily. NO MORE REFILLS PLEASE CALL TO SCHEDULE ANNUAL OFFICE VISIT 30 tablet 1  . irbesartan (AVAPRO) 150 MG tablet Take 1 tablet (150  mg total) by mouth daily. NEED TO SCHEDULE ANNUAL PHYSICAL [phone] 30 tablet 1  . oxycodone (OXY-IR) 5 MG capsule Limit  1 to 3 tablets by mouth daily if tolerated 90 capsule 0  . pantoprazole (PROTONIX) 40 MG tablet Take 40 mg by mouth daily.    . sertraline (ZOLOFT) 100 MG tablet Take 2 tablets (200 mg total) by mouth daily. 60 tablet 1  . testosterone cypionate (DEPOTESTOTERONE CYPIONATE) 200 MG/ML injection Inject 1 mL (200 mg total) into the muscle every 14 (fourteen) days. 1 cc biweekly IM (self Injected)  Last injection was on 04/12/11 10 mL 5  . tiZANidine (ZANAFLEX) 4 MG tablet Limited 1 tablet by mouth 2-4 times per day if tolerated 120 tablet 2  . zolpidem (AMBIEN) 10 MG tablet Limit 1 tablet by mouth at bedtime when necessary for insomnia and if tolerated 30 tablet 0  . sulfamethoxazole-trimethoprim (BACTRIM DS,SEPTRA DS) 800-160 MG per tablet Take 1 tablet by mouth 2 (two) times daily. 20 tablet 0   No current facility-administered medications for this visit.    Allergies  Allergen Reactions  . Bupropion Hcl     REACTION: nightmares  . Codeine Hives    Family History  Problem Relation Age of  Onset  . Alcohol abuse Father   . Cancer Father     prostate  . Alcohol abuse Maternal Aunt   . Alcohol abuse Maternal Uncle   . Cancer Paternal Uncle     prostate  . Cancer Maternal Grandmother     ovarian cancer  . Alcohol abuse Other   . Anesthesia problems Neg Hx   . Hypotension Neg Hx   . Malignant hyperthermia Neg Hx   . Pseudochol deficiency Neg Hx     Social History   Social History  . Marital Status: Married    Spouse Name: N/A  . Number of Children: 2  . Years of Education: N/A   Occupational History  . unemployed    Social History Main Topics  . Smoking status: Never Smoker   . Smokeless tobacco: Never Used  . Alcohol Use: 3.0 oz/week    5 Standard drinks or equivalent per week     Comment: occasional  . Drug Use: No  . Sexual Activity: Yes    Other Topics Concern  . Not on file   Social History Narrative     Constitutional: Denies fever, malaise, fatigue, headache or abrupt weight changes.  HEENT: Pt reports runny nose. Denies eye pain, eye redness, ear pain, ringing in the ears, wax buildup, nasal congestion, bloody nose, or sore throat. Respiratory: Denies difficulty breathing, shortness of breath, cough or sputum production.   Cardiovascular: Denies chest pain, chest tightness, palpitations or swelling in the hands or feet.    No other specific complaints in a complete review of systems (except as listed in HPI above).  Objective:   Physical Exam   BP 148/90 mmHg  Pulse 76  Temp(Src) 98.2 F (36.8 C) (Oral)  Wt 179 lb (81.194 kg)  SpO2 98% Wt Readings from Last 3 Encounters:  10/19/14 179 lb (81.194 kg)  10/11/14 185 lb (83.915 kg)  09/15/14 188 lb (85.276 kg)    General: Appears  his stated age, well developed, well nourished in NAD. HEENT: Head: normal shape and size; Eyes: sclera white, no icterus, conjunctiva pink, PERRLA and EOMs intact; Ears: Tm's gray and intact, normal light reflex; Nose: swelling of the external nose noted. mucosa pink and moist, abscess noted in right nare; Throat/Mouth: Teeth present, mucosa pink and moist, no exudate, lesions or ulcerations noted.  Neck:  No adenopathy noted. Cardiovascular: Normal rate and rhythm. S1,S2 noted.  No murmur, rubs or gallops noted.  Pulmonary/Chest: Normal effort and positive vesicular breath sounds. No respiratory distress. No wheezes, rales or ronchi noted.   BMET    Component Value Date/Time   NA 138 09/06/2014 1146   K 4.3 09/06/2014 1146   CL 103 09/06/2014 1146   CO2 30 09/06/2014 1146   GLUCOSE 80 09/06/2014 1146   BUN 19 09/06/2014 1146   CREATININE 1.45 09/06/2014 1146   CALCIUM 9.6 09/06/2014 1146   GFRNONAA 78* 01/19/2014 1417   GFRAA >90 01/19/2014 1417    Lipid Panel     Component Value Date/Time   CHOL 143 09/15/2014  0901   TRIG 161.0* 09/15/2014 0901   HDL 36.30* 09/15/2014 0901   CHOLHDL 4 09/15/2014 0901   VLDL 32.2 09/15/2014 0901   LDLCALC 75 09/15/2014 0901    CBC    Component Value Date/Time   WBC 7.4 09/06/2014 1146   WBC 7.4 01/02/2010 1416   RBC 5.52 09/06/2014 1146   RBC 4.86 01/02/2010 1416   HGB 17.2* 09/06/2014 1146   HGB Unable  to Determine 01/02/2010 1416   HCT 50.1 09/06/2014 1146   HCT 41.2 01/02/2010 1416   PLT 190.0 09/06/2014 1146   PLT 208 01/02/2010 1416   MCV 90.7 09/06/2014 1146   MCV 84.8 01/02/2010 1416   MCH 32.8 01/28/2014 0500   MCH Unable to determine 01/02/2010 1416   MCHC 34.3 09/06/2014 1146   MCHC Unable to determine 01/02/2010 1416   RDW 12.9 09/06/2014 1146   RDW 12.9 01/02/2010 1416   LYMPHSABS 0.9 01/19/2014 1417   LYMPHSABS 1.6 01/02/2010 1416   MONOABS 0.7 01/19/2014 1417   MONOABS 0.4 01/02/2010 1416   EOSABS 0.1 01/19/2014 1417   EOSABS 0.1 01/02/2010 1416   BASOSABS 0.0 01/19/2014 1417   BASOSABS 0.0 01/02/2010 1416    Hgb A1C No results found for: HGBA1C      Assessment & Plan:   Nasal abscess:  80 mg Depo IM today eRx for Septra BID x 10 days He declines RX for Bactroban nasal ointment, he states "I can not stick anything up my nose" Return precautions given  RTC as needed or if symptoms persist or worsen

## 2014-10-19 NOTE — Progress Notes (Signed)
Pre visit review using our clinic review tool, if applicable. No additional management support is needed unless otherwise documented below in the visit note. 

## 2014-10-19 NOTE — Patient Instructions (Signed)

## 2014-10-20 MED ORDER — METHYLPREDNISOLONE ACETATE 80 MG/ML IJ SUSP
80.0000 mg | Freq: Once | INTRAMUSCULAR | Status: AC
  Administered 2014-10-20: 80 mg via INTRAMUSCULAR

## 2014-10-20 NOTE — Addendum Note (Signed)
Addended by: Lurlean Nanny on: 10/20/2014 03:46 PM   Modules accepted: Orders

## 2014-10-31 ENCOUNTER — Other Ambulatory Visit: Payer: Self-pay | Admitting: Internal Medicine

## 2014-11-14 ENCOUNTER — Other Ambulatory Visit: Payer: Self-pay

## 2014-11-14 DIAGNOSIS — J340 Abscess, furuncle and carbuncle of nose: Secondary | ICD-10-CM

## 2014-11-14 MED ORDER — SULFAMETHOXAZOLE-TRIMETHOPRIM 800-160 MG PO TABS: 1.0000 | ORAL_TABLET | Freq: Two times a day (BID) | ORAL | Status: DC

## 2014-11-14 NOTE — Telephone Encounter (Signed)
Pt was seen 10/19/14 and took Bactrim for in nose. Almost completely cleared up but infection in nose has restarted; pt is in [place] and plans to return home on 11/15/14; pt request refill, cannot afford to come in for appt. Pt request [facility]. [place].

## 2014-11-26 ENCOUNTER — Other Ambulatory Visit: Payer: Self-pay | Admitting: Internal Medicine

## 2014-11-28 NOTE — Telephone Encounter (Signed)
Last filled 08/29/14 with 1 refill--- I did not see any notes in continuing or stopping medication--please advise if okay to refill

## 2014-12-02 ENCOUNTER — Other Ambulatory Visit: Payer: Self-pay | Admitting: Orthopedic Surgery

## 2014-12-02 DIAGNOSIS — M24812 Other specific joint derangements of left shoulder, not elsewhere classified: Secondary | ICD-10-CM

## 2014-12-08 ENCOUNTER — Ambulatory Visit
Admission: RE | Admit: 2014-12-08 | Discharge: 2014-12-08 | Disposition: A | Discharge status: Home / Self Care | Payer: BLUE CROSS/BLUE SHIELD | Source: Ambulatory Visit | Attending: Orthopedic Surgery | Admitting: Orthopedic Surgery

## 2014-12-08 DIAGNOSIS — M24812 Other specific joint derangements of left shoulder, not elsewhere classified: Secondary | ICD-10-CM

## 2015-01-10 ENCOUNTER — Encounter: Payer: Self-pay | Admitting: Internal Medicine

## 2015-01-10 ENCOUNTER — Ambulatory Visit (INDEPENDENT_AMBULATORY_CARE_PROVIDER_SITE_OTHER): Payer: BLUE CROSS/BLUE SHIELD | Admitting: Internal Medicine

## 2015-01-10 VITALS — BP 138/84 | HR 73 | Temp 98.2°F | Wt 183.0 lb

## 2015-01-10 DIAGNOSIS — G4700 Insomnia, unspecified: Secondary | ICD-10-CM

## 2015-01-10 DIAGNOSIS — L0211 Cutaneous abscess of neck: Secondary | ICD-10-CM

## 2015-01-10 MED ORDER — ZOLPIDEM TARTRATE 10 MG PO TABS: ORAL_TABLET | ORAL | Status: DC

## 2015-01-10 MED ORDER — SULFAMETHOXAZOLE-TRIMETHOPRIM 800-160 MG PO TABS: 2.0000 | ORAL_TABLET | Freq: Two times a day (BID) | ORAL | Status: DC

## 2015-01-10 MED ORDER — HYDROCODONE-ACETAMINOPHEN 10-325 MG PO TABS: 1.0000 | ORAL_TABLET | Freq: Three times a day (TID) | ORAL | Status: DC | PRN

## 2015-01-10 NOTE — Progress Notes (Signed)
Pre visit review using our clinic review tool, if applicable. No additional management support is needed unless otherwise documented below in the visit note. 

## 2015-01-10 NOTE — Progress Notes (Signed)
Subjective:    Patient ID: Derrick Kelley, male    DOB: 31-Mar-1960, 54 y.o.   MRN: [id number]  HPI  Pt presents to the clinic today with c/o a lump to his right neck. He noticed this 1 week ago after he went hunting. He is not sure if he got bit by a bug or not. It started out like a pimple but grew in size. It is now hard, red and very tender to touch. It has drained a small amount of fluid. He denies fever, chills or body aches. He did try to get his wife to pop it with a pin, but nothing came out. He has been cleaning it with soap and water. He has tried warm compresses and cold packs.   He would also like his Ambien refilled today. He ran out 3 weeks ago.  Review of Systems      Past Medical History  Diagnosis Date  . BACK PAIN, CHRONIC 05/11/2009    bone on bone;hx of herniated disc  . Flushing 05/11/2009  . HYPERLIPIDEMIA     takes Lipitor daily  . HYPOGONADISM 05/11/2009    takes Testosterone every 14days  . INSOMNIA-SLEEP DISORDER-UNSPEC 10/10/2009    takes Ambien nightly  . POLYCYTHEMIA 05/11/2009  . SCIATICA, RIGHT 09/20/2009  . SINUSITIS- ACUTE-NOS 10/10/2009  . URI 05/11/2009  . Arthritis   . Chronic back pain     hx buldging disc;  . Hemorrhoids     hx of  . HYPERTENSION     takes Avapro,Amlodipine,and Lisinopril daily  . Headache(784.0) 10/10/2009    cluster;last one about a yr ago  . Joint pain   . Joint pain   . GERD 05/11/2009    but doesn't require meds  . DEPRESSION     takes Zoloft daily  . Sleep apnea     use CPAP  . Hypercholesteremia     Current Outpatient Prescriptions  Medication Sig Dispense Refill  . amLODipine (NORVASC) 10 MG tablet Take 1 tablet (10 mg total) by mouth daily. 30 tablet 5  . atorvastatin (LIPITOR) 20 MG tablet Take 1 tablet (20 mg total) by mouth daily at 6 PM. 30 tablet 5  . irbesartan (AVAPRO) 150 MG tablet Take 1 tablet (150 mg total) by mouth daily. 30 tablet 5  . oxycodone (OXY-IR) 5 MG capsule Limit  1 to 3 tablets by  mouth daily if tolerated 90 capsule 0  . pantoprazole (PROTONIX) 40 MG tablet Take 40 mg by mouth daily.    . sertraline (ZOLOFT) 100 MG tablet Take 2 tablets (200 mg total) by mouth daily. 60 tablet 1  . sulfamethoxazole-trimethoprim (BACTRIM DS,SEPTRA DS) 800-160 MG tablet Take 2 tablets by mouth 2 (two) times daily. 40 tablet 0  . testosterone cypionate (DEPOTESTOTERONE CYPIONATE) 200 MG/ML injection Inject 1 mL (200 mg total) into the muscle every 14 (fourteen) days. 1 cc biweekly IM (self Injected)  Last injection was on 04/12/11 10 mL 5  . tiZANidine (ZANAFLEX) 4 MG tablet Limited 1 tablet by mouth 2-4 times per day if tolerated 120 tablet 2  . HYDROcodone-acetaminophen (NORCO) 10-325 MG tablet Take 1 tablet by mouth every 8 (eight) hours as needed. 30 tablet 0  . zolpidem (AMBIEN) 10 MG tablet Limit 1 tablet by mouth at bedtime when necessary for insomnia and if tolerated 30 tablet 0   No current facility-administered medications for this visit.    Allergies  Allergen Reactions  . Bupropion Hcl  REACTION: nightmares  . Codeine Hives    Family History  Problem Relation Age of Onset  . Alcohol abuse Father   . Cancer Father     prostate  . Alcohol abuse Maternal Aunt   . Alcohol abuse Maternal Uncle   . Cancer Paternal Uncle     prostate  . Cancer Maternal Grandmother     ovarian cancer  . Alcohol abuse Other   . Anesthesia problems Neg Hx   . Hypotension Neg Hx   . Malignant hyperthermia Neg Hx   . Pseudochol deficiency Neg Hx     Social History   Social History  . Marital Status: Married    Spouse Name: N/A  . Number of Children: 2  . Years of Education: N/A   Occupational History  . unemployed    Social History Main Topics  . Smoking status: Never Smoker   . Smokeless tobacco: Never Used  . Alcohol Use: 3.0 oz/week    5 Standard drinks or equivalent per week     Comment: occasional  . Drug Use: No  . Sexual Activity: Yes   Other Topics Concern  .  Not on file   Social History Narrative     Constitutional: Denies fever, malaise, fatigue, headache or abrupt weight changes.  Skin: Pt reports lump on neck. Denies rashes, lesions or ulcercations.    No other specific complaints in a complete review of systems (except as listed in HPI above).  Objective:   Physical Exam   BP 138/84 mmHg  Pulse 73  Temp(Src) 98.2 F (36.8 C) (Oral)  Wt 183 lb (83.008 kg)  SpO2 98% Wt Readings from Last 3 Encounters:  01/10/15 183 lb (83.008 kg)  10/19/14 179 lb (81.194 kg)  10/11/14 185 lb (83.915 kg)    General: Appears his stated age, well developed, well nourished in NAD. Skin: 2 cm round abscess noted of right posterior neck. Surrounding cellulitis.  BMET    Component Value Date/Time   NA 138 09/06/2014 1146   K 4.3 09/06/2014 1146   CL 103 09/06/2014 1146   CO2 30 09/06/2014 1146   GLUCOSE 80 09/06/2014 1146   BUN 19 09/06/2014 1146   CREATININE 1.45 09/06/2014 1146   CALCIUM 9.6 09/06/2014 1146   GFRNONAA 78* 01/19/2014 1417   GFRAA >90 01/19/2014 1417    Lipid Panel     Component Value Date/Time   CHOL 143 09/15/2014 0901   TRIG 161.0* 09/15/2014 0901   HDL 36.30* 09/15/2014 0901   CHOLHDL 4 09/15/2014 0901   VLDL 32.2 09/15/2014 0901   LDLCALC 75 09/15/2014 0901    CBC    Component Value Date/Time   WBC 7.4 09/06/2014 1146   WBC 7.4 01/02/2010 1416   RBC 5.52 09/06/2014 1146   RBC 4.86 01/02/2010 1416   HGB 17.2* 09/06/2014 1146   HGB Unable to Determine 01/02/2010 1416   HCT 50.1 09/06/2014 1146   HCT 41.2 01/02/2010 1416   PLT 190.0 09/06/2014 1146   PLT 208 01/02/2010 1416   MCV 90.7 09/06/2014 1146   MCV 84.8 01/02/2010 1416   MCH 32.8 01/28/2014 0500   MCH Unable to determine 01/02/2010 1416   MCHC 34.3 09/06/2014 1146   MCHC Unable to determine 01/02/2010 1416   RDW 12.9 09/06/2014 1146   RDW 12.9 01/02/2010 1416   LYMPHSABS 0.9 01/19/2014 1417   LYMPHSABS 1.6 01/02/2010 1416   MONOABS 0.7  01/19/2014 1417   MONOABS 0.4 01/02/2010 1416   EOSABS  0.1 01/19/2014 1417   EOSABS 0.1 01/02/2010 1416   BASOSABS 0.0 01/19/2014 1417   BASOSABS 0.0 01/02/2010 1416    Hgb A1C No results found for: HGBA1C      Assessment & Plan:   Abscess of neck:  I & D performed- see procedure note eRx for Septra 2 tabs BID x 10 days RX for Norco 10-325 mg # 30, 0 refills  Insomnia:   Refilled Ambien  Procedure Note:  Area cleansed with Betadine x 3 Area numbed with 1.5 mL Lidocaine with Epi Area incised with # 11 blade Unable to express much pus, pus is solid Area washed with sterile water Covered with TAB and bandage Aftercare instructions given  Return precautions given

## 2015-01-10 NOTE — Patient Instructions (Signed)

## 2015-02-06 ENCOUNTER — Other Ambulatory Visit: Payer: Self-pay | Admitting: Internal Medicine

## 2015-02-09 ENCOUNTER — Other Ambulatory Visit: Payer: Self-pay | Admitting: Internal Medicine

## 2015-02-09 NOTE — Telephone Encounter (Signed)
Ok to phone in ambien 

## 2015-02-09 NOTE — Telephone Encounter (Signed)
Last filled 01/10/2015--please advise

## 2015-02-09 NOTE — Telephone Encounter (Signed)
Rx called in to pharmacy. 

## 2015-03-02 ENCOUNTER — Telehealth: Payer: Self-pay | Admitting: Pain Medicine

## 2015-03-02 ENCOUNTER — Telehealth: Payer: Self-pay

## 2015-03-02 NOTE — Telephone Encounter (Signed)
Patient would like to come back and see Dr. Primus Bravo as pain phys, is it ok to sched him an appt. ?

## 2015-03-02 NOTE — Telephone Encounter (Signed)
Juliann Pulse and [facility] Please schedule patient for evaluation

## 2015-03-02 NOTE — Telephone Encounter (Signed)
Scheduled eval for 03/14/15 @ 0830

## 2015-03-14 ENCOUNTER — Encounter: Payer: Self-pay | Admitting: Pain Medicine

## 2015-03-14 ENCOUNTER — Ambulatory Visit: Payer: BLUE CROSS/BLUE SHIELD | Attending: Pain Medicine | Admitting: Pain Medicine

## 2015-03-14 VITALS — BP 147/83 | HR 61 | Temp 98.0°F | Resp 16 | Ht 68.0 in | Wt 180.0 lb

## 2015-03-14 DIAGNOSIS — M19011 Primary osteoarthritis, right shoulder: Secondary | ICD-10-CM | POA: Diagnosis not present

## 2015-03-14 DIAGNOSIS — Z981 Arthrodesis status: Secondary | ICD-10-CM | POA: Insufficient documentation

## 2015-03-14 DIAGNOSIS — M19012 Primary osteoarthritis, left shoulder: Secondary | ICD-10-CM | POA: Diagnosis not present

## 2015-03-14 DIAGNOSIS — Z9889 Other specified postprocedural states: Secondary | ICD-10-CM | POA: Insufficient documentation

## 2015-03-14 DIAGNOSIS — Z789 Other specified health status: Secondary | ICD-10-CM

## 2015-03-14 DIAGNOSIS — M5136 Other intervertebral disc degeneration, lumbar region: Secondary | ICD-10-CM

## 2015-03-14 DIAGNOSIS — M545 Low back pain: Secondary | ICD-10-CM | POA: Diagnosis present

## 2015-03-14 DIAGNOSIS — F1910 Other psychoactive substance abuse, uncomplicated: Secondary | ICD-10-CM

## 2015-03-14 DIAGNOSIS — M47818 Spondylosis without myelopathy or radiculopathy, sacral and sacrococcygeal region: Secondary | ICD-10-CM

## 2015-03-14 DIAGNOSIS — Z7289 Other problems related to lifestyle: Secondary | ICD-10-CM

## 2015-03-14 DIAGNOSIS — M461 Sacroiliitis, not elsewhere classified: Secondary | ICD-10-CM

## 2015-03-14 DIAGNOSIS — M47817 Spondylosis without myelopathy or radiculopathy, lumbosacral region: Secondary | ICD-10-CM

## 2015-03-14 DIAGNOSIS — M51369 Other intervertebral disc degeneration, lumbar region without mention of lumbar back pain or lower extremity pain: Secondary | ICD-10-CM

## 2015-03-14 DIAGNOSIS — M533 Sacrococcygeal disorders, not elsewhere classified: Secondary | ICD-10-CM | POA: Diagnosis not present

## 2015-03-14 DIAGNOSIS — M47816 Spondylosis without myelopathy or radiculopathy, lumbar region: Secondary | ICD-10-CM

## 2015-03-14 DIAGNOSIS — M79606 Pain in leg, unspecified: Secondary | ICD-10-CM | POA: Diagnosis present

## 2015-03-14 DIAGNOSIS — M961 Postlaminectomy syndrome, not elsewhere classified: Secondary | ICD-10-CM

## 2015-03-14 DIAGNOSIS — F1090 Alcohol use, unspecified, uncomplicated: Secondary | ICD-10-CM

## 2015-03-14 NOTE — Progress Notes (Signed)
   Subjective:    Patient ID: Derrick Kelley, male    DOB: [birth year], 55 y.o.   MRN: [id number]  HPI The patient is a 55 year old gentleman who returns to pain management for further evaluation and treatment of pain involving the lower back and lower extremity region. The patient states that he is in hopes of being able to be approved for radiofrequency rhizolysis lumbar facet and sacroiliac joint regions. We discussed patient's condition. We discussed prior MRI findings and after discussion of patient's pain decision was made to proceed with scheduling patient for interventional treatment consisting of block of nerves to the sacroiliac joint to be performed at time return appointment. We will also request permission to prescribe patient's medications and will consider patient for additional modifications of treatment pending follow-up evaluation. The patient also has pain involving the shoulder and status post surgery of the shoulder. The patient wishes to be considered for radiofrequency rhizolysis for lumbar lower extremity pain. At the present time we will proceed with block of nerves to the sacroiliac joint and we'll request insurance approval for patient to undergo radio freaks rhizolysis lumbar facets. The patient was understanding and agrees to treatment plan.   Review of Systems     Objective:   Physical Exam  There was tenderness of the splenius capitis and occipitalis musculature regions palpation of these regions reproduced pain of mild degree with mild tenderness of the cervical facet cervical paraspinal misreading the thoracic facet thoracic paraspinal musculature region. The patient was with appeared to be tenderness to palpation of the acromioclavicular and glenohumeral joint region on the left greater than the right with slightly decreased grip strength with Tinel and Phalen's maneuver reproducing minimal discomfort. There was tenderness over the thoracic facet with no crepitus of  the thoracic region noted. Muscle spasms of the lower thoracic region noted. Palpation over the lumbar paraspinal must reason lumbar facet region was with moderate moderately severe discomfort with lateral bending rotation extension and palpation of the lumbar facets reproducing moderately severe discomfort. Straight leg raise was tolerates approximately 20 without increased pain with dorsiflexion noted. There was increase of pain with pressure applied to the ileum with patient in lateral decubitus position and severe increase of pain with palpation over the PSIS and PII S region as well as the lumbar facet lumbar paraspinal musculature region. No definite sensory deficit or dermatomal distribution detected. There was negative clonus negative Homans. Abdomen nontender with no costovertebral tenderness noted.      Assessment & Plan:     Degenerative disc disease lumbar spine Status post L4-L5 posterior lumbar interbody fusion and decompression of the central canal with solid fusion L5-S1, L3-4 degenerative disc disease, moderate facet arthrosis.  Lumbar facet syndrome  Sacroiliac joint dysfunction  Degenerative joint disease of shoulders Status post surgery of shoulder     PLAN   Continue present medications  Block of nerves to the sacroiliac joint to be performed at time of return appointment  Ask nurses, Angie, and receptionists to re-apply for radiofrequency rhizolysis for you. Your informed me today that you were told by our staff that you have approval for the radiofrequency had run out  F/U PCP for evaliation of  BP and general medical  condition  F/U surgical evaluation  F/U neurological evaluation  May consider radiofrequency rhizolysis or intraspinal procedures pending response to present treatment and F/U evaluation   Patient to call [facility] should patient have concerns prior to scheduled return appointment.

## 2015-03-14 NOTE — Progress Notes (Signed)
Safety precautions to be maintained throughout the outpatient stay will include: orient to surroundings, keep bed in low position, maintain call bell within reach at all times, provide assistance with transfer out of bed and ambulation.  

## 2015-03-14 NOTE — Patient Instructions (Addendum)
Continue present medications  Block of nerves to the sacroiliac joint to be performed at time of return appointment  Ask nurses, Angie, and receptionists to re-apply for radiofrequency rhizolysis for you. Your informed me today that you were told by our staff that you have approval for the radiofrequency had run out  F/U PCP for evaliation of  BP and general medical  condition  F/U surgical evaluation  F/U neurological evaluation  May consider radiofrequency rhizolysis or intraspinal procedures pending response to present treatment and F/U evaluation   Patient to call [facility] Center should patient have concerns prior to scheduled return appointment. Pain Management Discharge Instructions  General Discharge Instructions :  If you need to reach your doctor call: Monday-Friday 8:00 am - 4:00 pm at [phone] or toll free [phone].  After clinic hours [phone] to have operator reach doctor.  Bring all of your medication bottles to all your appointments in the pain clinic.  To cancel or reschedule your appointment with Pain Management please remember to call 24 hours in advance to avoid a fee.  Refer to the educational materials which you have been given on: General Risks, I had my Procedure. Discharge Instructions, Post Sedation.  Post Procedure Instructions:  The drugs you were given will stay in your system until tomorrow, so for the next 24 hours you should not drive, make any legal decisions or drink any alcoholic beverages.  You may eat anything you prefer, but it is better to start with liquids then soups and crackers, and gradually work up to solid foods.  Please notify your doctor immediately if you have any unusual bleeding, trouble breathing or pain that is not related to your normal pain.  Depending on the type of procedure that was done, some parts of your body may feel week and/or numb.  This usually clears up by tonight or the next day.  Walk with the  use of an assistive device or accompanied by an adult for the 24 hours.  You may use ice on the affected area for the first 24 hours.  Put ice in a Ziploc bag and cover with a towel and place against area 15 minutes on 15 minutes off.  You may switch to heat after 24 hours.Sacroiliac (SI) Joint Injection Patient Information  Description: The sacroiliac joint connects the scrum (very low back and tailbone) to the ilium (a pelvic bone which also forms half of the hip joint).  Normally this joint experiences very little motion.  When this joint becomes inflamed or unstable low back and or hip and pelvis pain may result.  Injection of this joint with local anesthetics (numbing medicines) and steroids can provide diagnostic information and reduce pain.  This injection is performed with the aid of x-ray guidance into the tailbone area while you are lying on your stomach.   You may experience an electrical sensation down the leg while this is being done.  You may also experience numbness.  We also may ask if we are reproducing your normal pain during the injection.  Conditions which may be treated SI injection:   Low back, buttock, hip or leg pain  Preparation for the Injection:  1. Do not eat any solid food or dairy products within 6 hours of your appointment.  2. You may drink clear liquids up to 2 hours before appointment.  Clear liquids include water, black coffee, juice or soda.  No milk or cream please. 3. You may take your regular medications, including pain  medications with a sip of water before your appointment.  Diabetics should hold regular insulin (if take separately) and take 1/2 normal NPH dose the morning of the procedure.  Carry some sugar containing items with you to your appointment. 4. A driver must accompany you and be prepared to drive you home after your procedure. 5. Bring all of your current medications with you. 6. An IV may be inserted and sedation may be given at the  discretion of the physician. 7. A blood pressure cuff, EKG and other monitors will often be applied during the procedure.  Some patients may need to have extra oxygen administered for a short period.  8. You will be asked to provide medical information, including your allergies, prior to the procedure.  We must know immediately if you are taking blood thinners (like Coumadin/Warfarin) or if you are allergic to IV iodine contrast (dye).  We must know if you could possible be pregnant.  Possible side effects:   Bleeding from needle site  Infection (rare, may require surgery)  Nerve injury (rare)  Numbness & tingling (temporary)  A brief convulsion or seizure  Light-headedness (temporary)  Pain at injection site (several days)  Decreased blood pressure (temporary)  Weakness in the leg (temporary)   Call if you experience:   New onset weakness or numbness of an extremity below the injection site that last more than 8 hours.  Hives or difficulty breathing ( go to the emergency room)  Inflammation or drainage at the injection site  Any new symptoms which are concerning to you  Please note:  Although the local anesthetic injected can often make your back/ hip/ buttock/ leg feel good for several hours after the injections, the pain will likely return.  It takes 3-7 days for steroids to work in the sacroiliac area.  You may not notice any pain relief for at least that one week.  If effective, we will often do a series of three injections spaced 3-6 weeks apart to maximally decrease your pain.  After the initial series, we generally will wait some months before a repeat injection of the same type.  If you have any questions, please call [phone] [place]  What are the risk, side effects and possible complications? Generally speaking, most procedures are safe.  However, with any procedure there are risks,  side effects, and the possibility of complications.  The risks and complications are dependent upon the sites that are lesioned, or the type of nerve block to be performed.  The closer the procedure is to the spine, the more serious the risks are.  Great care is taken when placing the radio frequency needles, block needles or lesioning probes, but sometimes complications can occur. 1. Infection: Any time there is an injection through the skin, there is a risk of infection.  This is why sterile conditions are used for these blocks.  There are four possible types of infection. 1. Localized skin infection. 2. Central Nervous System Infection-This can be in the form of Meningitis, which can be deadly. 3. Epidural Infections-This can be in the form of an epidural abscess, which can cause pressure inside of the spine, causing compression of the spinal cord with subsequent paralysis. This would require an emergency surgery to decompress, and there are no guarantees that the patient would recover from the paralysis. 4. Discitis-This is an infection of the intervertebral discs.  It occurs in about 1% of discography  procedures.  It is difficult to treat and it may lead to surgery.        2. Pain: the needles have to go through skin and soft tissues, will cause soreness.       3. Damage to internal structures:  The nerves to be lesioned may be near blood vessels or    other nerves which can be potentially damaged.       4. Bleeding: Bleeding is more common if the patient is taking blood thinners such as  aspirin, Coumadin, Ticiid, Plavix, etc., or if he/she have some genetic predisposition  such as hemophilia. Bleeding into the spinal canal can cause compression of the spinal  cord with subsequent paralysis.  This would require an emergency surgery to  decompress and there are no guarantees that the patient would recover from the  paralysis.       5. Pneumothorax:  Puncturing of a lung is a possibility, every time a  needle is introduced in  the area of the chest or upper back.  Pneumothorax refers to free air around the  collapsed lung(s), inside of the thoracic cavity (chest cavity).  Another two possible  complications related to a similar event would include: Hemothorax and Chylothorax.   These are variations of the Pneumothorax, where instead of air around the collapsed  lung(s), you may have blood or chyle, respectively.       6. Spinal headaches: They may occur with any procedures in the area of the spine.       7. Persistent CSF (Cerebro-Spinal Fluid) leakage: This is a rare problem, but may occur  with prolonged intrathecal or epidural catheters either due to the formation of a fistulous  track or a dural tear.       8. Nerve damage: By working so close to the spinal cord, there is always a possibility of  nerve damage, which could be as serious as a permanent spinal cord injury with  paralysis.       9. Death:  Although rare, severe deadly allergic reactions known as "Anaphylactic  reaction" can occur to any of the medications used.      10. Worsening of the symptoms:  We can always make thing worse.  What are the chances of something like this happening? Chances of any of this occuring are extremely low.  By statistics, you have more of a chance of getting killed in a motor vehicle accident: while driving to the hospital than any of the above occurring .  Nevertheless, you should be aware that they are possibilities.  In general, it is similar to taking a shower.  Everybody knows that you can slip, hit your head and get killed.  Does that mean that you should not shower again?  Nevertheless always keep in mind that statistics do not mean anything if you happen to be on the wrong side of them.  Even if a procedure has a 1 (one) in a 1,000,000 (million) chance of going wrong, it you happen to be that one..Also, keep in mind that by statistics, you have more of a chance of having something go wrong when taking  medications.  Who should not have this procedure? If you are on a blood thinning medication (e.g. Coumadin, Plavix, see list of "Blood Thinners"), or if you have an active infection going on, you should not have the procedure.  If you are taking any blood thinners, please inform your physician.  How should I prepare for this  procedure?  Do not eat or drink anything at least six hours prior to the procedure.  Bring a driver with you .  It cannot be a taxi.  Come accompanied by an adult that can drive you back, and that is strong enough to help you if your legs get weak or numb from the local anesthetic.  Take all of your medicines the morning of the procedure with just enough water to swallow them.  If you have diabetes, make sure that you are scheduled to have your procedure done first thing in the morning, whenever possible.  If you have diabetes, take only half of your insulin dose and notify our nurse that you have done so as soon as you arrive at the clinic.  If you are diabetic, but only take blood sugar pills (oral hypoglycemic), then do not take them on the morning of your procedure.  You may take them after you have had the procedure.  Do not take aspirin or any aspirin-containing medications, at least eleven (11) days prior to the procedure.  They may prolong bleeding.  Wear loose fitting clothing that may be easy to take off and that you would not mind if it got stained with Betadine or blood.  Do not wear any jewelry or perfume  Remove any nail coloring.  It will interfere with some of our monitoring equipment.  NOTE: Remember that this is not meant to be interpreted as a complete list of all possible complications.  Unforeseen problems may occur.  BLOOD THINNERS The following drugs contain aspirin or other products, which can cause increased bleeding during surgery and should not be taken for 2 weeks prior to and 1 week after surgery.  If you should need take something for  relief of minor pain, you may take acetaminophen which is found in Tylenol,m Datril, Anacin-3 and Panadol. It is not blood thinner. The products listed below are.  Do not take any of the products listed below in addition to any listed on your instruction sheet.  A.P.C or A.P.C with Codeine Codeine Phosphate Capsules #3 Ibuprofen Ridaura  ABC compound Congesprin Imuran rimadil  Advil Cope Indocin Robaxisal  Alka-Seltzer Effervescent Pain Reliever and Antacid Coricidin or Coricidin-D  Indomethacin Rufen  Alka-Seltzer plus Cold Medicine Cosprin Ketoprofen S-A-C Tablets  Anacin Analgesic Tablets or Capsules Coumadin Korlgesic Salflex  Anacin Extra Strength Analgesic tablets or capsules CP-2 Tablets Lanoril Salicylate  Anaprox Cuprimine Capsules Levenox Salocol  Anexsia-D Dalteparin Magan Salsalate  Anodynos Darvon compound Magnesium Salicylate Sine-off  Ansaid Dasin Capsules Magsal Sodium Salicylate  Anturane Depen Capsules Marnal Soma  APF Arthritis pain formula Dewitt's Pills Measurin Stanback  Argesic Dia-Gesic Meclofenamic Sulfinpyrazone  Arthritis Bayer Timed Release Aspirin Diclofenac Meclomen Sulindac  Arthritis pain formula Anacin Dicumarol Medipren Supac  Analgesic (Safety coated) Arthralgen Diffunasal Mefanamic Suprofen  Arthritis Strength Bufferin Dihydrocodeine Mepro Compound Suprol  Arthropan liquid Dopirydamole Methcarbomol with Aspirin Synalgos  ASA tablets/Enseals Disalcid Micrainin Tagament  Ascriptin Doan's Midol Talwin  Ascriptin A/D Dolene Mobidin Tanderil  Ascriptin Extra Strength Dolobid Moblgesic Ticlid  Ascriptin with Codeine Doloprin or Doloprin with Codeine Momentum Tolectin  Asperbuf Duoprin Mono-gesic Trendar  Aspergum Duradyne Motrin or Motrin IB Triminicin  Aspirin plain, buffered or enteric coated Durasal Myochrisine Trigesic  Aspirin Suppositories Easprin Nalfon Trillsate  Aspirin with Codeine Ecotrin Regular or Extra Strength Naprosyn Uracel  Atromid-S  Efficin Naproxen Ursinus  Auranofin Capsules Elmiron Neocylate Vanquish  Axotal Emagrin Norgesic Verin  Azathioprine Empirin or Empirin with Codeine Normiflo  Vitamin E  Azolid Emprazil Nuprin Voltaren  Bayer Aspirin plain, buffered or children's or timed BC Tablets or powders Encaprin Orgaran Warfarin Sodium  Buff-a-Comp Enoxaparin Orudis Zorpin  Buff-a-Comp with Codeine Equegesic Os-Cal-Gesic   Buffaprin Excedrin plain, buffered or Extra Strength Oxalid   Bufferin Arthritis Strength Feldene Oxphenbutazone   Bufferin plain or Extra Strength Feldene Capsules Oxycodone with Aspirin   Bufferin with Codeine Fenoprofen Fenoprofen Pabalate or Pabalate-SF   Buffets II Flogesic Panagesic   Buffinol plain or Extra Strength Florinal or Florinal with Codeine Panwarfarin   Buf-Tabs Flurbiprofen Penicillamine   Butalbital Compound Four-way cold tablets Penicillin   Butazolidin Fragmin Pepto-Bismol   Carbenicillin Geminisyn Percodan   Carna Arthritis Reliever Geopen Persantine   Carprofen Gold's salt Persistin   Chloramphenicol Goody's Phenylbutazone   Chloromycetin Haltrain Piroxlcam   Clmetidine heparin Plaquenil   Cllnoril Hyco-pap Ponstel   Clofibrate Hydroxy chloroquine Propoxyphen         Before stopping any of these medications, be sure to consult the physician who ordered them.  Some, such as Coumadin (Warfarin) are ordered to prevent or treat serious conditions such as "deep thrombosis", "pumonary embolisms", and other heart problems.  The amount of time that you may need off of the medication may also vary with the medication and the reason for which you were taking it.  If you are taking any of these medications, please make sure you notify your pain physician before you undergo any procedures.

## 2015-03-15 ENCOUNTER — Other Ambulatory Visit: Payer: Self-pay | Admitting: Internal Medicine

## 2015-03-15 NOTE — Telephone Encounter (Signed)
Last filled 02/09/15--please advise

## 2015-03-15 NOTE — Telephone Encounter (Signed)
Ok to phone in ambien 

## 2015-03-15 NOTE — Telephone Encounter (Signed)
Pt left v/m requesting status of ambien refill; pt saw new pain mgt group and doctor will not refill until next appt. Pt last annual 09/15/14. Pt request cb.

## 2015-03-16 ENCOUNTER — Other Ambulatory Visit: Payer: Self-pay | Admitting: Internal Medicine

## 2015-03-16 NOTE — Telephone Encounter (Signed)
Rx called in to pharmacy. 

## 2015-03-20 ENCOUNTER — Ambulatory Visit: Payer: BLUE CROSS/BLUE SHIELD | Admitting: Pain Medicine

## 2015-03-27 ENCOUNTER — Telehealth: Payer: Self-pay | Admitting: Pain Medicine

## 2015-03-27 NOTE — Telephone Encounter (Signed)
[facility] Schedule patient for procedure Block of nerves to sacroiliac joint for Wednesday, April 05, 2015 Request permission for prescribing medications for treatment of patient's pain

## 2015-03-27 NOTE — Telephone Encounter (Signed)
Wants to know when Dr. Primus Bravo will write meds as he will be out on Thurs? Wants to come in and get scripts then sched procedure, please contact patient and let him know the plan

## 2015-03-27 NOTE — Telephone Encounter (Signed)
patient notified via VM of above. He is to call back and confirm appointment.

## 2015-03-27 NOTE — Telephone Encounter (Signed)
Dr. Primus Bravo              Patient states that you were going to start prescribing his medications. Currently we do not have any order to get permission to prescribe from [facility]. Patient will be out of medication on Thursday 03-30-15. He had to cancel procedure visit last week due to- no driver available, but wants to reschedule it. Wants to know what he is supposed to do about    1)  his medications and 2) rescheduling his procedure appt.  Please advise. Thank you- Anderson Malta

## 2015-03-31 ENCOUNTER — Other Ambulatory Visit: Payer: Self-pay | Admitting: Pain Medicine

## 2015-04-03 ENCOUNTER — Encounter: Payer: Self-pay | Admitting: Pain Medicine

## 2015-04-05 ENCOUNTER — Ambulatory Visit: Payer: BLUE CROSS/BLUE SHIELD | Attending: Pain Medicine | Admitting: Pain Medicine

## 2015-04-05 ENCOUNTER — Encounter: Payer: Self-pay | Admitting: Pain Medicine

## 2015-04-05 VITALS — BP 142/88 | HR 65 | Temp 97.6°F | Resp 16 | Ht 68.0 in | Wt 185.0 lb

## 2015-04-05 DIAGNOSIS — M79606 Pain in leg, unspecified: Secondary | ICD-10-CM | POA: Diagnosis present

## 2015-04-05 DIAGNOSIS — M19011 Primary osteoarthritis, right shoulder: Secondary | ICD-10-CM

## 2015-04-05 DIAGNOSIS — Z789 Other specified health status: Secondary | ICD-10-CM

## 2015-04-05 DIAGNOSIS — M545 Low back pain: Secondary | ICD-10-CM | POA: Insufficient documentation

## 2015-04-05 DIAGNOSIS — M47817 Spondylosis without myelopathy or radiculopathy, lumbosacral region: Secondary | ICD-10-CM

## 2015-04-05 DIAGNOSIS — M19012 Primary osteoarthritis, left shoulder: Secondary | ICD-10-CM

## 2015-04-05 DIAGNOSIS — F1910 Other psychoactive substance abuse, uncomplicated: Secondary | ICD-10-CM

## 2015-04-05 DIAGNOSIS — M47816 Spondylosis without myelopathy or radiculopathy, lumbar region: Secondary | ICD-10-CM

## 2015-04-05 DIAGNOSIS — M5136 Other intervertebral disc degeneration, lumbar region: Secondary | ICD-10-CM | POA: Insufficient documentation

## 2015-04-05 DIAGNOSIS — Z7289 Other problems related to lifestyle: Secondary | ICD-10-CM

## 2015-04-05 DIAGNOSIS — M961 Postlaminectomy syndrome, not elsewhere classified: Secondary | ICD-10-CM

## 2015-04-05 DIAGNOSIS — M533 Sacrococcygeal disorders, not elsewhere classified: Secondary | ICD-10-CM

## 2015-04-05 DIAGNOSIS — M47818 Spondylosis without myelopathy or radiculopathy, sacral and sacrococcygeal region: Secondary | ICD-10-CM

## 2015-04-05 DIAGNOSIS — Z981 Arthrodesis status: Secondary | ICD-10-CM | POA: Insufficient documentation

## 2015-04-05 DIAGNOSIS — M461 Sacroiliitis, not elsewhere classified: Secondary | ICD-10-CM

## 2015-04-05 MED ORDER — MIDAZOLAM HCL 5 MG/5ML IJ SOLN
INTRAMUSCULAR | Status: AC
  Administered 2015-04-05: 4 mg via INTRAVENOUS
  Filled 2015-04-05: qty 5

## 2015-04-05 MED ORDER — CEFAZOLIN SODIUM 1 G IJ SOLR
INTRAMUSCULAR | Status: AC
  Administered 2015-04-05: 1 g via INTRAVENOUS
  Filled 2015-04-05: qty 10

## 2015-04-05 MED ORDER — FENTANYL CITRATE (PF) 100 MCG/2ML IJ SOLN
INTRAMUSCULAR | Status: AC
  Administered 2015-04-05: 100 ug via INTRAVENOUS
  Filled 2015-04-05: qty 2

## 2015-04-05 MED ORDER — CEFUROXIME AXETIL 250 MG PO TABS: 250.0000 mg | ORAL_TABLET | Freq: Two times a day (BID) | ORAL | Status: DC

## 2015-04-05 MED ORDER — CEFAZOLIN SODIUM 1-5 GM-% IV SOLN: 1.0000 g | Freq: Once | INTRAVENOUS | Status: DC

## 2015-04-05 MED ORDER — TRIAMCINOLONE ACETONIDE 40 MG/ML IJ SUSP
INTRAMUSCULAR | Status: AC
  Administered 2015-04-05: 12:00:00
  Filled 2015-04-05: qty 1

## 2015-04-05 MED ORDER — MIDAZOLAM HCL 5 MG/5ML IJ SOLN: 5.0000 mg | Freq: Once | INTRAMUSCULAR | Status: DC

## 2015-04-05 MED ORDER — TRIAMCINOLONE ACETONIDE 40 MG/ML IJ SUSP: 40.0000 mg | Freq: Once | INTRAMUSCULAR | Status: DC

## 2015-04-05 MED ORDER — BUPIVACAINE HCL (PF) 0.25 % IJ SOLN: 30.0000 mL | Freq: Once | INTRAMUSCULAR | Status: DC

## 2015-04-05 MED ORDER — LACTATED RINGERS IV SOLN: 1000.0000 mL | INTRAVENOUS | Status: DC

## 2015-04-05 MED ORDER — ORPHENADRINE CITRATE 30 MG/ML IJ SOLN: 60.0000 mg | Freq: Once | INTRAMUSCULAR | Status: DC

## 2015-04-05 MED ORDER — FENTANYL CITRATE (PF) 100 MCG/2ML IJ SOLN: 100.0000 ug | Freq: Once | INTRAMUSCULAR | Status: DC

## 2015-04-05 MED ORDER — ORPHENADRINE CITRATE 30 MG/ML IJ SOLN
INTRAMUSCULAR | Status: AC
  Administered 2015-04-05: 12:00:00
  Filled 2015-04-05: qty 2

## 2015-04-05 MED ORDER — BUPIVACAINE HCL (PF) 0.25 % IJ SOLN
INTRAMUSCULAR | Status: AC
  Administered 2015-04-05: 12:00:00
  Filled 2015-04-05: qty 30

## 2015-04-05 NOTE — Patient Instructions (Addendum)
Continue present medications. Please get Ceftin antibiotic today and begin taking Ceftin antibiotic today as prescribed  Block of nerves to the sacroiliac joint to be performed at time of return appointment  F/U PCP R Baity for evaliation of  BP and general medical  condition  F/U surgical evaluation as discussed  F/U neurological evaluation. May consider [facility] EMG studies and other studies  May consider radiofrequency rhizolysis or intraspinal procedures pending response to present treatment and F/U evaluation   Patient to call [facility] should patient have concerns prior to scheduled return appointment.GENERAL RISKS AND COMPLICATIONS  What are the risk, side effects and possible complications? Generally speaking, most procedures are safe.  However, with any procedure there are risks, side effects, and the possibility of complications.  The risks and complications are dependent upon the sites that are lesioned, or the type of nerve block to be performed.  The closer the procedure is to the spine, the more serious the risks are.  Great care is taken when placing the radio frequency needles, block needles or lesioning probes, but sometimes complications can occur. 1. Infection: Any time there is an injection through the skin, there is a risk of infection.  This is why sterile conditions are used for these blocks.  There are four possible types of infection. 1. Localized skin infection. 2. Central Nervous System Infection-This can be in the form of Meningitis, which can be deadly. 3. Epidural Infections-This can be in the form of an epidural abscess, which can cause pressure inside of the spine, causing compression of the spinal cord with subsequent paralysis. This would require an emergency surgery to decompress, and there are no guarantees that the patient would recover from the paralysis. 4. Discitis-This is an infection of the intervertebral discs.  It occurs in about 1% of  discography procedures.  It is difficult to treat and it may lead to surgery.        2. Pain: the needles have to go through skin and soft tissues, will cause soreness.       3. Damage to internal structures:  The nerves to be lesioned may be near blood vessels or    other nerves which can be potentially damaged.       4. Bleeding: Bleeding is more common if the patient is taking blood thinners such as  aspirin, Coumadin, Ticiid, Plavix, etc., or if he/she have some genetic predisposition  such as hemophilia. Bleeding into the spinal canal can cause compression of the spinal  cord with subsequent paralysis.  This would require an emergency surgery to  decompress and there are no guarantees that the patient would recover from the  paralysis.       5. Pneumothorax:  Puncturing of a lung is a possibility, every time a needle is introduced in  the area of the chest or upper back.  Pneumothorax refers to free air around the  collapsed lung(s), inside of the thoracic cavity (chest cavity).  Another two possible  complications related to a similar event would include: Hemothorax and Chylothorax.   These are variations of the Pneumothorax, where instead of air around the collapsed  lung(s), you may have blood or chyle, respectively.       6. Spinal headaches: They may occur with any procedures in the area of the spine.       7. Persistent CSF (Cerebro-Spinal Fluid) leakage: This is a rare problem, but may occur  with prolonged intrathecal or epidural catheters either due  to the formation of a fistulous  track or a dural tear.       8. Nerve damage: By working so close to the spinal cord, there is always a possibility of  nerve damage, which could be as serious as a permanent spinal cord injury with  paralysis.       9. Death:  Although rare, severe deadly allergic reactions known as "Anaphylactic  reaction" can occur to any of the medications used.      10. Worsening of the symptoms:  We can always make thing  worse.  What are the chances of something like this happening? Chances of any of this occuring are extremely low.  By statistics, you have more of a chance of getting killed in a motor vehicle accident: while driving to the hospital than any of the above occurring .  Nevertheless, you should be aware that they are possibilities.  In general, it is similar to taking a shower.  Everybody knows that you can slip, hit your head and get killed.  Does that mean that you should not shower again?  Nevertheless always keep in mind that statistics do not mean anything if you happen to be on the wrong side of them.  Even if a procedure has a 1 (one) in a 1,000,000 (million) chance of going wrong, it you happen to be that one..Also, keep in mind that by statistics, you have more of a chance of having something go wrong when taking medications.  Who should not have this procedure? If you are on a blood thinning medication (e.g. Coumadin, Plavix, see list of "Blood Thinners"), or if you have an active infection going on, you should not have the procedure.  If you are taking any blood thinners, please inform your physician.  How should I prepare for this procedure?  Do not eat or drink anything at least six hours prior to the procedure.  Bring a driver with you .  It cannot be a taxi.  Come accompanied by an adult that can drive you back, and that is strong enough to help you if your legs get weak or numb from the local anesthetic.  Take all of your medicines the morning of the procedure with just enough water to swallow them.  If you have diabetes, make sure that you are scheduled to have your procedure done first thing in the morning, whenever possible.  If you have diabetes, take only half of your insulin dose and notify our nurse that you have done so as soon as you arrive at the clinic.  If you are diabetic, but only take blood sugar pills (oral hypoglycemic), then do not take them on the morning of your  procedure.  You may take them after you have had the procedure.  Do not take aspirin or any aspirin-containing medications, at least eleven (11) days prior to the procedure.  They may prolong bleeding.  Wear loose fitting clothing that may be easy to take off and that you would not mind if it got stained with Betadine or blood.  Do not wear any jewelry or perfume  Remove any nail coloring.  It will interfere with some of our monitoring equipment.  NOTE: Remember that this is not meant to be interpreted as a complete list of all possible complications.  Unforeseen problems may occur.  BLOOD THINNERS The following drugs contain aspirin or other products, which can cause increased bleeding during surgery and should not be taken for 2 weeks  prior to and 1 week after surgery.  If you should need take something for relief of minor pain, you may take acetaminophen which is found in Tylenol,m Datril, Anacin-3 and Panadol. It is not blood thinner. The products listed below are.  Do not take any of the products listed below in addition to any listed on your instruction sheet.  A.P.C or A.P.C with Codeine Codeine Phosphate Capsules #3 Ibuprofen Ridaura  ABC compound Congesprin Imuran rimadil  Advil Cope Indocin Robaxisal  Alka-Seltzer Effervescent Pain Reliever and Antacid Coricidin or Coricidin-D  Indomethacin Rufen  Alka-Seltzer plus Cold Medicine Cosprin Ketoprofen S-A-C Tablets  Anacin Analgesic Tablets or Capsules Coumadin Korlgesic Salflex  Anacin Extra Strength Analgesic tablets or capsules CP-2 Tablets Lanoril Salicylate  Anaprox Cuprimine Capsules Levenox Salocol  Anexsia-D Dalteparin Magan Salsalate  Anodynos Darvon compound Magnesium Salicylate Sine-off  Ansaid Dasin Capsules Magsal Sodium Salicylate  Anturane Depen Capsules Marnal Soma  APF Arthritis pain formula Dewitt's Pills Measurin Stanback  Argesic Dia-Gesic Meclofenamic Sulfinpyrazone  Arthritis Bayer Timed Release Aspirin  Diclofenac Meclomen Sulindac  Arthritis pain formula Anacin Dicumarol Medipren Supac  Analgesic (Safety coated) Arthralgen Diffunasal Mefanamic Suprofen  Arthritis Strength Bufferin Dihydrocodeine Mepro Compound Suprol  Arthropan liquid Dopirydamole Methcarbomol with Aspirin Synalgos  ASA tablets/Enseals Disalcid Micrainin Tagament  Ascriptin Doan's Midol Talwin  Ascriptin A/D Dolene Mobidin Tanderil  Ascriptin Extra Strength Dolobid Moblgesic Ticlid  Ascriptin with Codeine Doloprin or Doloprin with Codeine Momentum Tolectin  Asperbuf Duoprin Mono-gesic Trendar  Aspergum Duradyne Motrin or Motrin IB Triminicin  Aspirin plain, buffered or enteric coated Durasal Myochrisine Trigesic  Aspirin Suppositories Easprin Nalfon Trillsate  Aspirin with Codeine Ecotrin Regular or Extra Strength Naprosyn Uracel  Atromid-S Efficin Naproxen Ursinus  Auranofin Capsules Elmiron Neocylate Vanquish  Axotal Emagrin Norgesic Verin  Azathioprine Empirin or Empirin with Codeine Normiflo Vitamin E  Azolid Emprazil Nuprin Voltaren  Bayer Aspirin plain, buffered or children's or timed BC Tablets or powders Encaprin Orgaran Warfarin Sodium  Buff-a-Comp Enoxaparin Orudis Zorpin  Buff-a-Comp with Codeine Equegesic Os-Cal-Gesic   Buffaprin Excedrin plain, buffered or Extra Strength Oxalid   Bufferin Arthritis Strength Feldene Oxphenbutazone   Bufferin plain or Extra Strength Feldene Capsules Oxycodone with Aspirin   Bufferin with Codeine Fenoprofen Fenoprofen Pabalate or Pabalate-SF   Buffets II Flogesic Panagesic   Buffinol plain or Extra Strength Florinal or Florinal with Codeine Panwarfarin   Buf-Tabs Flurbiprofen Penicillamine   Butalbital Compound Four-way cold tablets Penicillin   Butazolidin Fragmin Pepto-Bismol   Carbenicillin Geminisyn Percodan   Carna Arthritis Reliever Geopen Persantine   Carprofen Gold's salt Persistin   Chloramphenicol Goody's Phenylbutazone   Chloromycetin Haltrain Piroxlcam    Clmetidine heparin Plaquenil   Cllnoril Hyco-pap Ponstel   Clofibrate Hydroxy chloroquine Propoxyphen         Before stopping any of these medications, be sure to consult the physician who ordered them.  Some, such as Coumadin (Warfarin) are ordered to prevent or treat serious conditions such as "deep thrombosis", "pumonary embolisms", and other heart problems.  The amount of time that you may need off of the medication may also vary with the medication and the reason for which you were taking it.  If you are taking any of these medications, please make sure you notify your pain physician before you undergo any procedures.

## 2015-04-05 NOTE — Progress Notes (Signed)
Subjective:    Patient ID: Derrick Kelley, male    DOB: April 14, 1960, 55 y.o.   MRN: [id number]  HPI  PROCEDURE:  Block of nerves to the sacroiliac joint.   NOTE:  The patient is a 55 y.o. male who returns to the [facility] for further evaluation and treatment of pain involving the lower back and lower extremity region with pain in the region of the buttocks as well. Prior MRI studies reveal degenerative disc disease lumbar spine Status post L4-L5 posterior lumbar interbody fusion and decompression of the central canal with solid fusion L5-S1, L3-4 degenerative disc disease, moderate facet arthrosis..  The patient is with reproduction of severe pain with palpation over the PSIS and PII S regions. There is concern regarding a significant component of the patient's pain being due to sacroiliac joint dysfunction The risks, benefits, expectations of the procedure have been discussed and explained to the patient who is understanding and willing to proceed with interventional treatment in attempt to decrease severity of patient's symptoms, minimize the risk of medication escalation and  hopefully retard the progression of the patient's symptoms. We will proceed with what is felt to be a medically necessary procedure, block of nerves to the sacroiliac joint.   DESCRIPTION OF PROCEDURE:  Block of nerves to the sacroiliac joint.   The patient was taken to the fluoroscopy suite. With the patient in the prone position with EKG, blood pressure, pulse and pulse oximetry monitoring, IV Versed, IV fentanyl conscious sedation, Betadine prep of proposed entry site was performed.   Block of nerves at the L5 vertebral body level.   With the patient in prone position, under fluoroscopic guidance, a 22 -gauge needle was inserted at the L5 vertebral body level on the left side. With 15 degrees oblique orientation a 22 -gauge needle was inserted in the region known as Burton's eye or eye of the Scotty dog.  Following documentation of needle placement in the area of Burton's eye or eye of the Scotty dog under fluoroscopic guidance, needle placement was then accomplished at the sacral ala level on the left side.   Needle placement at the sacral ala.   With the patient in prone position under fluoroscopic guidance with AP view of the lumbosacral spine, a 22 -gauge needle was inserted in the region known as the sacral ala on the left side. Following documentation of needle placement on the left side under fluoroscopic guidance needle placement was then accomplished at the S1 foramen level.   Needle placement at the S1 foramen level.   With the patient in prone position under fluoroscopic guidance with AP view of the lumbosacral spine and cephalad orientation, a 22 -gauge needle was inserted at the superior and lateral border of the S1 foramen on the left side. Following documentation of needle placement at the S1 foramen level on the left side, needle placement was then accomplished at the S2 foramen level on the left side.   Needle placement at the S2 foramen level.   With the patient in prone position with AP view of the lumbosacral spine with cephalad orientation, a 22 - gauge needle was inserted at the superior and lateral border of the S2 foramen under fluoroscopic guidance on the left side. Following needle placement at the L5 vertebral body level, sacral ala, S1 foramen and S2 foramen on the left side, needle placement was verified on lateral view under fluoroscopic guidance.  Following needle placement documentation on lateral view, each needle was  injected with 1 mL of 0.25% bupivacaine and Kenalog.   BLOCK OF THE NERVES TO SACROILIAC JOINT ON THE RIGHT SIDE The procedure was performed on the right side at the same levels as was performed on the left side and utilizing the same technique as on the left side and was performed under fluoroscopic guidance as on the left side   A total of 10mg  of  Kenalog was utilized for the procedure.   PLAN:  1. Medications: The patient will continue presently prescribed medications.  2. The patient will be considered for modification of treatment regimen pending response to the procedure performed on today's visit.  3. The patient is to follow-up with primary care physician R Bsaity for evaluation of blood pressure and general medical condition following the procedure performed on today's visit.  4. Surgical evaluation as discussed. Has been addressed 5. Neurological evaluation as discussed. May consider [facility] EMG studies and other studies 6. The patient may be a candidate for radiofrequency procedures, implantation devices and other treatment pending response to treatment performed on today's visit and follow-up evaluation. We are planning to proceed with radiofrequency rhizolysis as discussed with patient  7. The patient has been advised to adhere to proper body mechanics and to avoid activities which may exacerbate the patient's symptoms.   Return appointment to [facility] as scheduled.    Review of Systems     Objective:   Physical Exam        Assessment & Plan:

## 2015-04-06 ENCOUNTER — Telehealth: Payer: Self-pay | Admitting: *Deleted

## 2015-04-06 NOTE — Telephone Encounter (Signed)
Spoke with patient, verbalizes no complications from procedure on yesterday. 

## 2015-04-11 ENCOUNTER — Other Ambulatory Visit: Payer: Self-pay | Admitting: Internal Medicine

## 2015-04-12 ENCOUNTER — Other Ambulatory Visit: Payer: Self-pay | Admitting: Internal Medicine

## 2015-04-12 NOTE — Telephone Encounter (Signed)
Ok to phone in ambien 

## 2015-04-12 NOTE — Telephone Encounter (Signed)
Last filled 03/15/15--please advise

## 2015-04-12 NOTE — Telephone Encounter (Signed)
Rx called in to pharmacy. 

## 2015-04-17 ENCOUNTER — Encounter: Payer: Self-pay | Admitting: Pain Medicine

## 2015-04-27 ENCOUNTER — Encounter: Payer: Self-pay | Admitting: Pain Medicine

## 2015-04-27 ENCOUNTER — Ambulatory Visit: Payer: BLUE CROSS/BLUE SHIELD | Attending: Pain Medicine | Admitting: Pain Medicine

## 2015-04-27 VITALS — BP 130/77 | HR 75 | Temp 97.8°F | Resp 16 | Ht 68.0 in | Wt 175.0 lb

## 2015-04-27 DIAGNOSIS — M961 Postlaminectomy syndrome, not elsewhere classified: Secondary | ICD-10-CM

## 2015-04-27 DIAGNOSIS — M19012 Primary osteoarthritis, left shoulder: Secondary | ICD-10-CM | POA: Diagnosis not present

## 2015-04-27 DIAGNOSIS — M546 Pain in thoracic spine: Secondary | ICD-10-CM | POA: Diagnosis present

## 2015-04-27 DIAGNOSIS — Z9889 Other specified postprocedural states: Secondary | ICD-10-CM | POA: Diagnosis not present

## 2015-04-27 DIAGNOSIS — M19011 Primary osteoarthritis, right shoulder: Secondary | ICD-10-CM | POA: Diagnosis not present

## 2015-04-27 DIAGNOSIS — M5136 Other intervertebral disc degeneration, lumbar region: Secondary | ICD-10-CM | POA: Insufficient documentation

## 2015-04-27 DIAGNOSIS — M461 Sacroiliitis, not elsewhere classified: Secondary | ICD-10-CM

## 2015-04-27 DIAGNOSIS — M545 Low back pain: Secondary | ICD-10-CM | POA: Diagnosis present

## 2015-04-27 DIAGNOSIS — Z981 Arthrodesis status: Secondary | ICD-10-CM | POA: Diagnosis not present

## 2015-04-27 DIAGNOSIS — M533 Sacrococcygeal disorders, not elsewhere classified: Secondary | ICD-10-CM | POA: Insufficient documentation

## 2015-04-27 DIAGNOSIS — M47818 Spondylosis without myelopathy or radiculopathy, sacral and sacrococcygeal region: Secondary | ICD-10-CM

## 2015-04-27 DIAGNOSIS — M47817 Spondylosis without myelopathy or radiculopathy, lumbosacral region: Secondary | ICD-10-CM

## 2015-04-27 DIAGNOSIS — M47816 Spondylosis without myelopathy or radiculopathy, lumbar region: Secondary | ICD-10-CM

## 2015-04-27 NOTE — Progress Notes (Signed)
Subjective:    Patient ID: Derrick Kelley, male    DOB: May 08, 1960, 55 y.o.   MRN: [id number]  HPI  The patient is a 55 year old gentleman who returns to pain management for further evaluation and treatment of pain involving the upper back shoulder mid and lower back and lower extremity regions. The patient is with significant pain involving the region of the left shoulder. The patient is status post surgical intervention of the shoulder performed by Dr. Tamera Punt. The patient will follow-up with Dr. Bridgett Larsson regarding symptoms involving the left upper extremity. The patient admits to numbness and tingling of the left upper extremity. We discussed patient's condition and informed patient that patient may be candidate for cervical MRI further evaluate pain involving the left upper extremity as well as the numbness and tingling sensations of the left upper extremity. At the present time patient wishes to follow-up with Dr. Tamera Punt for further assessment of condition since Dr. Tamera Punt performed surgery of the shoulder. We will await results of surgical disposition of Dr. Tamera Punt. The patient stated that his lower back lower extremity pain was of significant degree and that he is in hopes of being able to undergo interventional treatment for treatment of the lower back and lower extremity pain. We are awaiting insurance approval for radiofrequency rhizolysis lumbar facet, medial branch nerves, and will proceed with such pending approval from insurance company. The patient was in agreement with suggested treatment plan at the present time we plan to begin with radiofrequency rhizolysis on the left side at L5, L4, and L3,. We also discussed patient's medications and informed patient that we would be unable to prescribe medications at this time return appointment. Informed patient that patient with be considered for medications pending results of additional follow-up evaluations and results of additional urine drug  screens. At the present time we will await insurance approval for radiofrequency rhizolysis.. Mrs. Fannie Knee, pain management nurse, inform me that patient was not in the room and that his urine was on the counter in the exam room. The patient left the clinic today without lateralization of his discharge. I instructed Mrs. Powell to call the patient to see if he had left the clinic by mistake and to get explanation of why he was not in the pain management center .   Review of Systems     Objective:   Physical Exam   There was tenderness to palpation of the splenius capitis and occipitalis musculature regions. Palpation of these regions reproduced pain of mild-to-moderate degree. There was moderate to moderately severe tenderness of the acromioclavicular and glenohumeral joint region. The patient was with decreased range of motion on the left compared to the right. Grip strength was grossly decreased. There was no definite abnormality noted Spurling's maneuver. Palpation over the thoracic facet thoracic paraspinal must reason was with evidence of muscle spasm moderate degree with no crepitus of the thoracic region noted. Tinel and Phalen's maneuver were without increase of pain of significant degree. Grip strength was questionably decreased. Palpation over the lumbar paraspinal must reason lumbar facet region was with moderate to moderately severe discomfort with severe tenderness in the region of the PSIS and PII S region as well as severe tends to palpation over the lumbar facet lumbar paraspinal must reason on the left compared to the right. Straight leg raising was tolerates approximately 20 without increased pain dorsiflexion noted. DTRs were difficult to elicit appeared to be trace at the knees. EHL strength appeared to be grossly decreased.  There was no definite sensory deficit or dermatomal distribution of the lower extremity is noted. There was negative clonus negative Homans. Abdomen was  nontender with no costovertebral tenderness noted. There was tends to palpation of paraspinal misreading cervical region cervical facet region      Assessment & Plan:     Degenerative disc disease lumbar spine Status post L4-L5 posterior lumbar interbody fusion and decompression of the central canal with solid fusion L5-S1, L3-4 degenerative disc disease, moderate facet arthrosis.  Lumbar facet syndrome  Sacroiliac joint dysfunction  Degenerative joint disease of shoulders Status post surgery of shoulder    Continue present medications  Ask Angie [employer] if insurance has approved you  for radiofrequency rhizolysis lumbar facet, medial branch nerves  F/U PCP R Baity for evaliation of  BP and general medical  Condition  Please repeat urine drug screen today as discussed  F/U surgical evaluation. Patient will follow-up with Dr. Tamera Punt for evaluation of shoulder. May consider cervical MRI for further assessment of pain and paresthesias of upper extremity pending evaluation by Dr. Tamera Punt May consider additional evaluation of lumbar and lower extremity pain as well  F/U neurological evaluation. May consider [facility] EMG studies and other studies especially for further assessment of paresthesias and pain of the upper extremities especially the left upper extremity  May consider radiofrequency rhizolysis or intraspinal procedures pending response to present treatment and F/U evaluation . As discussed we are waiting for insurance approval  Patient to call [facility] Management Center should patient have concerns prior to scheduled return appointment.

## 2015-04-27 NOTE — Progress Notes (Signed)
Safety precautions to be maintained throughout the outpatient stay will include: orient to surroundings, keep bed in low position, maintain call bell within reach at all times, provide assistance with transfer out of bed and ambulation.  Patient walked out without saying anything to anyone or without being discharged from pain management staff. Patient left urine sample on the counter; but no paperwork was completed(consent or urine form) and patient left the clinic.   Patient was called and was told we could not send this urine sample due to no consent and/or  Completed forms.

## 2015-04-27 NOTE — Patient Instructions (Addendum)
Continue present medications  Ask Angie and [employer] if insurance has approved you  for radiofrequency rhizolysis lumbar facet, medial branch nerves  F/U PCP R Baity for evaliation of  BP and general medical  Condition  Please repeat urine drug screen today as discussed  F/U surgical evaluation. Patient will follow-up with Dr. Tamera Punt for evaluation of shoulder. May consider cervical MRI for further assessment of pain and paresthesias of upper extremity pending evaluation by Dr. Tamera Punt May consider additional evaluation of lumbar and lower extremity pain as well  F/U neurological evaluation. May consider [facility] EMG studies and other studies  May consider radiofrequency rhizolysis or intraspinal procedures pending response to present treatment and F/U evaluation . As discussed we are waiting for insurance approval  Patient to call [facility] Management Center should patient have concerns prior to scheduled return appointment.

## 2015-05-09 ENCOUNTER — Encounter: Payer: Self-pay | Admitting: Pain Medicine

## 2015-05-09 ENCOUNTER — Other Ambulatory Visit: Payer: Self-pay | Admitting: Internal Medicine

## 2015-05-11 ENCOUNTER — Other Ambulatory Visit: Payer: Self-pay | Admitting: Pain Medicine

## 2015-05-12 ENCOUNTER — Ambulatory Visit (INDEPENDENT_AMBULATORY_CARE_PROVIDER_SITE_OTHER): Payer: BLUE CROSS/BLUE SHIELD | Admitting: Internal Medicine

## 2015-05-12 ENCOUNTER — Encounter: Payer: Self-pay | Admitting: Internal Medicine

## 2015-05-12 DIAGNOSIS — G8929 Other chronic pain: Secondary | ICD-10-CM

## 2015-05-12 DIAGNOSIS — G4700 Insomnia, unspecified: Secondary | ICD-10-CM | POA: Diagnosis not present

## 2015-05-12 DIAGNOSIS — M549 Dorsalgia, unspecified: Secondary | ICD-10-CM

## 2015-05-12 MED ORDER — ESZOPICLONE 2 MG PO TABS: 2.0000 mg | ORAL_TABLET | Freq: Every evening | ORAL | Status: DC | PRN

## 2015-05-12 MED ORDER — HYDROCODONE-ACETAMINOPHEN 10-325 MG PO TABS: 1.0000 | ORAL_TABLET | Freq: Three times a day (TID) | ORAL | Status: DC | PRN

## 2015-05-12 NOTE — Progress Notes (Signed)
Subjective:    Patient ID: Derrick Kelley, male    DOB: 12-Nov-1960, 55 y.o.   MRN: [id number]  HPI  Pt presents to the clinic today with c/o insomnia. He has been taking Ambien, 20 mg nightly, but he does not feel like it is working as well as it used to. He feels like he is building up a tolerance to it. He has had insomnia for the last 8 years. He has trouble falling asleep and staying asleep. He would like to try Lunesta.   He also request a refill of Norco. He reports he is following with Dr. Primus Bravo, for injections in his back. He is planning on getting RF in a few months, and hopes this will significantly help decrease his pain.   Review of Systems      Past Medical History  Diagnosis Date  . BACK PAIN, CHRONIC 05/11/2009    bone on bone;hx of herniated disc  . Flushing 05/11/2009  . HYPERLIPIDEMIA     takes Lipitor daily  . HYPOGONADISM 05/11/2009    takes Testosterone every 14days  . INSOMNIA-SLEEP DISORDER-UNSPEC 10/10/2009    takes Ambien nightly  . POLYCYTHEMIA 05/11/2009  . SCIATICA, RIGHT 09/20/2009  . SINUSITIS- ACUTE-NOS 10/10/2009  . URI 05/11/2009  . Arthritis   . Chronic back pain     hx buldging disc;  . Hemorrhoids     hx of  . HYPERTENSION     takes Avapro,Amlodipine,and Lisinopril daily  . Headache(784.0) 10/10/2009    cluster;last one about a yr ago  . Joint pain   . Joint pain   . GERD 05/11/2009    but doesn't require meds  . DEPRESSION     takes Zoloft daily  . Sleep apnea     use CPAP  . Hypercholesteremia     Current Outpatient Prescriptions  Medication Sig Dispense Refill  . amLODipine (NORVASC) 10 MG tablet TAKE 1 TABLET BY MOUTH DAILY 30 tablet 1  . atorvastatin (LIPITOR) 20 MG tablet TAKE 1 TABLET BY MOUTH DAILY AT 6PM. 30 tablet 1  . cefUROXime (CEFTIN) 250 MG tablet Take 1 tablet (250 mg total) by mouth 2 (two) times daily with a meal. 14 tablet 0  . celecoxib (CELEBREX) 100 MG capsule Take 100 mg by mouth daily.    Marland Kitchen  HYDROcodone-acetaminophen (NORCO) 10-325 MG tablet Take 1 tablet by mouth every 8 (eight) hours as needed. 30 tablet 0  . irbesartan (AVAPRO) 150 MG tablet Take 1 tablet (150 mg total) by mouth daily. 30 tablet 5  . oxycodone (OXY-IR) 5 MG capsule Limit  1 to 3 tablets by mouth daily if tolerated 90 capsule 0  . pantoprazole (PROTONIX) 40 MG tablet Take 40 mg by mouth daily.    . sertraline (ZOLOFT) 100 MG tablet Take 2 tablets (200 mg total) by mouth daily. MUST SCHEDULE OFFICE VISIT FOR February 2017 60 tablet 3  . testosterone cypionate (DEPOTESTOTERONE CYPIONATE) 200 MG/ML injection Inject 1 mL (200 mg total) into the muscle every 14 (fourteen) days. 1 cc biweekly IM (self Injected)  Last injection was on 04/12/11 10 mL 5  . tiZANidine (ZANAFLEX) 4 MG tablet Limited 1 tablet by mouth 2-4 times per day if tolerated 120 tablet 2  . zolpidem (AMBIEN) 10 MG tablet LIMIT 1 TABLET BY MOUTH AT BEDTIME WHEN NECESSARY FOR INSOMNIA AND IFTOLERATED 30 tablet 0   Current Facility-Administered Medications  Medication Dose Route Frequency Provider Last Rate Last Dose  . bupivacaine (PF) (MARCAINE)  0.25 % injection 30 mL  30 mL Other Once Mohammed Kindle, MD      . ceFAZolin (ANCEF) IVPB 1 g/50 mL premix  1 g Intravenous Once Mohammed Kindle, MD      . fentaNYL (SUBLIMAZE) injection 100 mcg  100 mcg Intravenous Once Mohammed Kindle, MD      . lactated ringers infusion 1,000 mL  1,000 mL Intravenous Continuous Mohammed Kindle, MD      . midazolam (VERSED) 5 MG/5ML injection 5 mg  5 mg Intravenous Once Mohammed Kindle, MD      . orphenadrine (NORFLEX) injection 60 mg  60 mg Intramuscular Once Mohammed Kindle, MD      . triamcinolone acetonide (KENALOG-40) injection 40 mg  40 mg Other Once Mohammed Kindle, MD        Allergies  Allergen Reactions  . Bupropion Hcl     REACTION: nightmares  . Codeine Hives    Family History  Problem Relation Age of Onset  . Alcohol abuse Father   . Cancer Father     prostate  .  Alcohol abuse Maternal Aunt   . Alcohol abuse Maternal Uncle   . Cancer Paternal Uncle     prostate  . Cancer Maternal Grandmother     ovarian cancer  . Alcohol abuse Other   . Anesthesia problems Neg Hx   . Hypotension Neg Hx   . Malignant hyperthermia Neg Hx   . Pseudochol deficiency Neg Hx     Social History   Social History  . Marital Status: Married    Spouse Name: N/A  . Number of Children: 2  . Years of Education: N/A   Occupational History  . unemployed    Social History Main Topics  . Smoking status: Never Smoker   . Smokeless tobacco: Never Used  . Alcohol Use: 3.0 oz/week    5 Standard drinks or equivalent per week     Comment: occasional  . Drug Use: No  . Sexual Activity: Yes   Other Topics Concern  . Not on file   Social History Narrative     Constitutional: Denies fever, malaise, fatigue, headache or abrupt weight changes.  Respiratory: Denies difficulty breathing, shortness of breath, cough or sputum production.   Cardiovascular: Denies chest pain, chest tightness, palpitations or swelling in the hands or feet.  Musculoskeletal: Pt reports chronic back pain. Denies decrease in range of motion, difficulty with gait, muscle pain or joint swelling.  Neurological: Pt reports insomnia. Denies dizziness, difficulty with memory, difficulty with speech or problems with balance and coordination.    No other specific complaints in a complete review of systems (except as listed in HPI above).  Objective:   Physical Exam  BP 126/78 mmHg  Pulse 82  Temp(Src) 98.3 F (36.8 C) (Oral)  Wt 188 lb (85.276 kg)  SpO2 98% Wt Readings from Last 3 Encounters:  05/12/15 188 lb (85.276 kg)  04/27/15 175 lb (79.379 kg)  04/05/15 185 lb (83.915 kg)    General: Appears his stated age, well developed, well nourished in NAD. Musculoskeletal: Decreased flexion. Normal extension and rotation. Bony tenderness noted over the lumbar spine. Strength 5/5 BUE/BLE. No  difficulty with gait.  Neurological: Alert and oriented.   BMET    Component Value Date/Time   NA 138 09/06/2014 1146   K 4.3 09/06/2014 1146   CL 103 09/06/2014 1146   CO2 30 09/06/2014 1146   GLUCOSE 80 09/06/2014 1146   BUN 19 09/06/2014 1146  CREATININE 1.45 09/06/2014 1146   CALCIUM 9.6 09/06/2014 1146   GFRNONAA 78* 01/19/2014 1417   GFRAA >90 01/19/2014 1417    Lipid Panel     Component Value Date/Time   CHOL 143 09/15/2014 0901   TRIG 161.0* 09/15/2014 0901   HDL 36.30* 09/15/2014 0901   CHOLHDL 4 09/15/2014 0901   VLDL 32.2 09/15/2014 0901   LDLCALC 75 09/15/2014 0901    CBC    Component Value Date/Time   WBC 7.4 09/06/2014 1146   WBC 7.4 01/02/2010 1416   RBC 5.52 09/06/2014 1146   RBC 4.86 01/02/2010 1416   HGB 17.2* 09/06/2014 1146   HGB Unable to Determine 01/02/2010 1416   HCT 50.1 09/06/2014 1146   HCT 41.2 01/02/2010 1416   PLT 190.0 09/06/2014 1146   PLT 208 01/02/2010 1416   MCV 90.7 09/06/2014 1146   MCV 84.8 01/02/2010 1416   MCH 32.8 01/28/2014 0500   MCH Unable to determine 01/02/2010 1416   MCHC 34.3 09/06/2014 1146   MCHC Unable to determine 01/02/2010 1416   RDW 12.9 09/06/2014 1146   RDW 12.9 01/02/2010 1416   LYMPHSABS 0.9 01/19/2014 1417   LYMPHSABS 1.6 01/02/2010 1416   MONOABS 0.7 01/19/2014 1417   MONOABS 0.4 01/02/2010 1416   EOSABS 0.1 01/19/2014 1417   EOSABS 0.1 01/02/2010 1416   BASOSABS 0.0 01/19/2014 1417   BASOSABS 0.0 01/02/2010 1416    Hgb A1C No results found for: HGBA1C       Assessment & Plan:

## 2015-05-12 NOTE — Assessment & Plan Note (Signed)
Will refill Norco He will have to get further refills from pain management

## 2015-05-12 NOTE — Patient Instructions (Signed)
Insomnia Insomnia is a sleep disorder that makes it difficult to fall asleep or to stay asleep. Insomnia can cause tiredness (fatigue), low energy, difficulty concentrating, mood swings, and poor performance at work or school.  There are three different ways to classify insomnia:  Difficulty falling asleep.  Difficulty staying asleep.  Waking up too early in the morning. Any type of insomnia can be long-term (chronic) or short-term (acute). Both are common. Short-term insomnia usually lasts for three months or less. Chronic insomnia occurs at least three times a week for longer than three months. CAUSES  Insomnia may be caused by another condition, situation, or substance, such as:  Anxiety.  Certain medicines.  Gastroesophageal reflux disease (GERD) or other gastrointestinal conditions.  Asthma or other breathing conditions.  Restless legs syndrome, sleep apnea, or other sleep disorders.  Chronic pain.  Menopause. This may include hot flashes.  Stroke.  Abuse of alcohol, tobacco, or illegal drugs.  Depression.  Caffeine.   Neurological disorders, such as Alzheimer disease.  An overactive thyroid (hyperthyroidism). The cause of insomnia may not be known. RISK FACTORS Risk factors for insomnia include:  Gender. Women are more commonly affected than men.  Age. Insomnia is more common as you get older.  Stress. This may involve your professional or personal life.  Income. Insomnia is more common in people with lower income.  Lack of exercise.   Irregular work schedule or night shifts.  Traveling between different time zones. SIGNS AND SYMPTOMS If you have insomnia, trouble falling asleep or trouble staying asleep is the main symptom. This may lead to other symptoms, such as:  Feeling fatigued.  Feeling nervous about going to sleep.  Not feeling rested in the morning.  Having trouble concentrating.  Feeling irritable, anxious, or depressed. TREATMENT   Treatment for insomnia depends on the cause. If your insomnia is caused by an underlying condition, treatment will focus on addressing the condition. Treatment may also include:   Medicines to help you sleep.  Counseling or therapy.  Lifestyle adjustments. HOME CARE INSTRUCTIONS   Take medicines only as directed by your health care provider.  Keep regular sleeping and waking hours. Avoid naps.  Keep a sleep diary to help you and your health care provider figure out what could be causing your insomnia. Include:   When you sleep.  When you wake up during the night.  How well you sleep.   How rested you feel the next day.  Any side effects of medicines you are taking.  What you eat and drink.   Make your bedroom a comfortable place where it is easy to fall asleep:  Put up shades or special blackout curtains to block light from outside.  Use a white noise machine to block noise.  Keep the temperature cool.   Exercise regularly as directed by your health care provider. Avoid exercising right before bedtime.  Use relaxation techniques to manage stress. Ask your health care provider to suggest some techniques that may work well for you. These may include:  Breathing exercises.  Routines to release muscle tension.  Visualizing peaceful scenes.  Cut back on alcohol, caffeinated beverages, and cigarettes, especially close to bedtime. These can disrupt your sleep.  Do not overeat or eat spicy foods right before bedtime. This can lead to digestive discomfort that can make it hard for you to sleep.  Limit screen use before bedtime. This includes:  Watching TV.  Using your smartphone, tablet, and computer.  Stick to a routine. This   can help you fall asleep faster. Try to do a quiet activity, brush your teeth, and go to bed at the same time each night.  Get out of bed if you are still awake after 15 minutes of trying to sleep. Keep the lights down, but try reading or  doing a quiet activity. When you feel sleepy, go back to bed.  Make sure that you drive carefully. Avoid driving if you feel very sleepy.  Keep all follow-up appointments as directed by your health care provider. This is important. SEEK MEDICAL CARE IF:   You are tired throughout the day or have trouble in your daily routine due to sleepiness.  You continue to have sleep problems or your sleep problems get worse. SEEK IMMEDIATE MEDICAL CARE IF:   You have serious thoughts about hurting yourself or someone else.   This information is not intended to replace advice given to you by your health care provider. Make sure you discuss any questions you have with your health care provider.   Document Released: 01/26/2000 Document Revised: 10/19/2014 Document Reviewed: 10/29/2013 Elsevier Interactive Patient Education 2016 [employer]  

## 2015-05-12 NOTE — Assessment & Plan Note (Signed)
Advised him to stop Ambien eRx for Lunesta 2 mg WH[facility]

## 2015-05-12 NOTE — Progress Notes (Signed)
Pre visit review using our clinic review tool, if applicable. No additional management support is needed unless otherwise documented below in the visit note. 

## 2015-05-23 ENCOUNTER — Encounter: Payer: Self-pay | Admitting: Pain Medicine

## 2015-05-23 ENCOUNTER — Ambulatory Visit: Payer: BLUE CROSS/BLUE SHIELD | Attending: Pain Medicine | Admitting: Pain Medicine

## 2015-05-23 VITALS — BP 142/78 | HR 65 | Temp 98.0°F | Resp 16 | Ht 68.0 in | Wt 180.0 lb

## 2015-05-23 DIAGNOSIS — M5136 Other intervertebral disc degeneration, lumbar region: Secondary | ICD-10-CM

## 2015-05-23 DIAGNOSIS — M25511 Pain in right shoulder: Secondary | ICD-10-CM | POA: Diagnosis present

## 2015-05-23 DIAGNOSIS — M19012 Primary osteoarthritis, left shoulder: Secondary | ICD-10-CM | POA: Diagnosis not present

## 2015-05-23 DIAGNOSIS — M47817 Spondylosis without myelopathy or radiculopathy, lumbosacral region: Secondary | ICD-10-CM

## 2015-05-23 DIAGNOSIS — M47818 Spondylosis without myelopathy or radiculopathy, sacral and sacrococcygeal region: Secondary | ICD-10-CM

## 2015-05-23 DIAGNOSIS — Z9889 Other specified postprocedural states: Secondary | ICD-10-CM | POA: Insufficient documentation

## 2015-05-23 DIAGNOSIS — Z981 Arthrodesis status: Secondary | ICD-10-CM | POA: Insufficient documentation

## 2015-05-23 DIAGNOSIS — M47816 Spondylosis without myelopathy or radiculopathy, lumbar region: Secondary | ICD-10-CM

## 2015-05-23 DIAGNOSIS — M19011 Primary osteoarthritis, right shoulder: Secondary | ICD-10-CM

## 2015-05-23 DIAGNOSIS — M961 Postlaminectomy syndrome, not elsewhere classified: Secondary | ICD-10-CM

## 2015-05-23 DIAGNOSIS — M542 Cervicalgia: Secondary | ICD-10-CM | POA: Diagnosis present

## 2015-05-23 DIAGNOSIS — M533 Sacrococcygeal disorders, not elsewhere classified: Secondary | ICD-10-CM

## 2015-05-23 DIAGNOSIS — M51369 Other intervertebral disc degeneration, lumbar region without mention of lumbar back pain or lower extremity pain: Secondary | ICD-10-CM

## 2015-05-23 DIAGNOSIS — M461 Sacroiliitis, not elsewhere classified: Secondary | ICD-10-CM

## 2015-05-23 DIAGNOSIS — M25512 Pain in left shoulder: Secondary | ICD-10-CM | POA: Diagnosis present

## 2015-05-23 MED ORDER — OXYCODONE-ACETAMINOPHEN 7.5-325 MG PO TABS: ORAL_TABLET | ORAL | Status: DC

## 2015-05-23 MED ORDER — GABAPENTIN 300 MG PO CAPS: ORAL_CAPSULE | ORAL | Status: DC

## 2015-05-23 NOTE — Progress Notes (Signed)
Safety precautions to be maintained throughout the outpatient stay will include: orient to surroundings, keep bed in low position, maintain call bell within reach at all times, provide assistance with transfer out of bed and ambulation.  

## 2015-05-23 NOTE — Progress Notes (Signed)
Subjective:    Patient ID: Derrick Kelley, male    DOB: 04-21-1960, 55 y.o.   MRN: [id number]  HPI  The patient is a 55 year old gentleman who returns to pain management for further evaluation and treatment of pain involving the neck shoulders entire back upper and lower extremity region. The patient is status post surgery of the shoulder performed by Dr. Tamera Punt. The patient states that the pain involving the shoulder is severe and Dr. Tamera Punt has concern that patient may have torn muscle in the region of the shoulder. The patient will follow-up Dr. Tamera Punt in this regard. The patient states that the lower back lower extremity pain is quite severe and is aggravated by twisting turning maneuvers. The patient continues to be hopeful of being able to undergo radiofrequency rhizolysis lumbar facet medial branch nerves for treatment of his lower back and lower extremity pain. We have requested approval once again for patient to be allowed to undergo radiofrequency rhizolysis lumbar facet, medial branch nerves. We will proceed with lumbar facet, medial branch nerve blocks to be performed at time return appointment in attempt to decrease severity of patient's symptoms, minimize progression of symptoms, and avoid the need for more involved treatment. The patient was provided prescription for Neurontin and oxycodone acetaminophen on today's visit and patient will continue Mobic as prescribed by Dr. Tamera Punt. All agreed to suggested treatment plan       Review of Systems     Objective:   Physical Exam    there was tenderness to palpation of the splenius capitis and occipitalis muscles regions palpation which be produced moderate to moderately severe discomfort of the acromioclavicular and glenohumeral joint regions. There was tends to palpation over the cervical facet cervical paraspinal must mature region as well as the thoracic facet and thoracic paraspinal muscular region a moderate degree. The  patient appeared to be with decreased grip strength and Tinel and Phalen's maneuver were without increase of pain of significant degree. Palpation over the thoracic facet thoracic paraspinal must reason reproduced pain with moderate muscle spasms involving the lower thoracic paraspinal musculature region. Palpation over the lumbar paraspinal musculatures and lumbar facet region was attends to palpation of moderate degree to moderately severe degree with lateral bending rotation extension and palpation over the lumbar facets reproducing moderately severe discomfort. Palpation over the PSIS and PII S region was with moderate severe discomfort as well as palpation over the gluteal and piriformis muscles regions. Straight leg raising was tolerates approximately 30 without an increase of pain with dorsiflexion noted. There appeared to be negative clonus negative Homans. DTRs were difficult to elicit. The patient had difficulty relaxing. No sensory deficit or dermatomal dystrophy detected. There was no significant abdominal tenderness to palpation and no costovertebral tenderness noted.      Assessment & Plan:     Degenerative disc disease lumbar spine Status post L4-L5 posterior lumbar interbody fusion and decompression of the central canal with solid fusion L5-S1, L3-4 degenerative disc disease, moderate facet arthrosis.  Lumbar facet syndrome  Sacroiliac joint dysfunction  Degenerative joint disease of shoulders Status post surgery of shoulder     PLAN   Continue present medications Mobic Neurontin(gabapentin) and Percocet   Lumbar facet, medial branch nerve, blocks to be performed at time of return appointment  Ask Angie and Shatoya if insurance has approved you  for radiofrequency rhizolysis lumbar facet, medial branch nerves  F/U PCP R Baity for evaliation of  BP and general medical condition  Please repeat  urine drug screen today as discussed  F/U surgical evaluation. Patient will  follow-up with Dr. Tamera Punt for evaluation of shoulder. May consider cervical MRI for further assessment of pain and paresthesias of upper extremity pending evaluation by Dr. Tamera Punt May consider additional evaluation of lumbar and lower extremity pain as well  F/U neurological evaluation. May consider PN[facility] EMG studies and other studies  May consider radiofrequency rhizolysis or intraspinal procedures pending response to present treatment and F/U evaluation . As discussed we are waiting for insurance approval  Patient to call [facility] should patient have concerns prior to scheduled return appointment

## 2015-05-23 NOTE — Patient Instructions (Addendum)
PLAN   Continue present medications Mobic Neurontin(gabapentin) and Percocet   Lumbar facet, medial branch nerve, blocks to be performed at time of return appointment  Ask Angie and Shatoya if insurance has approved you  for radiofrequency rhizolysis lumbar facet, medial branch nerves  F/U PCP R Baity for evaliation of  BP and general medical condition  Please repeat urine drug screen today as discussed  F/U surgical evaluation. Patient will follow-up with Dr. Tamera Punt for evaluation of shoulder. May consider cervical MRI for further assessment of pain and paresthesias of upper extremity pending evaluation by Dr. Tamera Punt May consider additional evaluation of lumbar and lower extremity pain as well  F/U neurological evaluation. May consider [facility] EMG studies and other studies  May consider radiofrequency rhizolysis or intraspinal procedures pending response to present treatment and F/U evaluation . As discussed we are waiting for insurance approval  Patient to call [facility] should patient have concerns prior to scheduled return appointment.Facet Joint Block The facet joints connect the bones of the spine (vertebrae). They make it possible for you to bend, twist, and make other movements with your spine. They also prevent you from overbending, overtwisting, and making other excessive movements.  A facet joint block is a procedure where a numbing medicine (anesthetic) is injected into a facet joint. Often, a type of anti-inflammatory medicine called a steroid is also injected. A facet joint block may be done for two reasons:   Diagnosis. A facet joint block may be done as a test to see whether neck or back pain is caused by a worn-down or infected facet joint. If the pain gets better after a facet joint block, it means the pain is probably coming from the facet joint. If the pain does not get better, it means the pain is probably not coming from the facet joint.   Therapy. A facet  joint block may be done to relieve neck or back pain caused by a facet joint. A facet joint block is only done as a therapy if the pain does not improve with medicine, exercise programs, physical therapy, and other forms of pain management. LET [facility] CARE PROVIDER KNOW ABOUT:   Any allergies you have.   All medicines you are taking, including vitamins, herbs, eyedrops, and over-the-counter medicines and creams.   Previous problems you or members of your family have had with the use of anesthetics.   Any blood disorders you have had.   Other health problems you have. RISKS AND COMPLICATIONS Generally, having a facet joint block is safe. However, as with any procedure, complications can occur. Possible complications associated with having a facet joint block include:   Bleeding.   Injury to a nerve near the injection site.   Pain at the injection site.   Weakness or numbness in areas controlled by nerves near the injection site.   Infection.   Temporary fluid retention.   Allergic reaction to anesthetics or medicines used during the procedure. BEFORE THE PROCEDURE   Follow your health care provider's instructions if you are taking dietary supplements or medicines. You may need to stop taking them or reduce your dosage.   Do not take any new dietary supplements or medicines without asking your health care provider first.   Follow your health care provider's instructions about eating and drinking before the procedure. You may need to stop eating and drinking several hours before the procedure.   Arrange to have an adult drive you home after the procedure. PROCEDURE  You may need to remove your clothing and dress in an open-back gown so that your health care provider can access your spine.   The procedure will be done while you are lying on an X-ray table. Most of the time you will be asked to lie on your stomach, but you may be asked to lie in a different  position if an injection will be made in your neck.   Special machines will be used to monitor your oxygen levels, heart rate, and blood pressure.   If an injection will be made in your neck, an intravenous (IV) tube will be inserted into one of your veins. Fluids and medicine will flow directly into your body through the IV tube.   The area over the facet joint where the injection will be made will be cleaned with an antiseptic soap. The surrounding skin will be covered with sterile drapes.   An anesthetic will be applied to your skin to make the injection area numb. You may feel a temporary stinging or burning sensation.   A video X-ray machine will be used to locate the joint. A contrast dye may be injected into the facet joint area to help with locating the joint.   When the joint is located, an anesthetic medicine will be injected into the joint through the needle.   Your health care provider will ask you whether you feel pain relief. If you do feel relief, a steroid may be injected to provide pain relief for a longer period of time. If you do not feel relief or feel only partial relief, additional injections of an anesthetic may be made in other facet joints.   The needle will be removed, the skin will be cleansed, and bandages will be applied.  AFTER THE PROCEDURE   You will be observed for 15-30 minutes before being allowed to go home. Do not drive. Have an adult drive you or take a taxi or public transportation instead.   If you feel pain relief, the pain will return in several hours or days when the anesthetic wears off.   You may feel pain relief 2-14 days after the procedure. The amount of time this relief lasts varies from person to person.   It is normal to feel some tenderness over the injected area(s) for 2 days following the procedure.   If you have diabetes, you may have a temporary increase in blood sugar.   This information is not intended to replace  advice given to you by your health care provider. Make sure you discuss any questions you have with your health care provider.   Document Released: 06/19/2006 Document Revised: 02/18/2014 Document Reviewed: 11/18/2011 Elsevier Interactive Patient Education 2016 [place]  What are the risk, side effects and possible complications? Generally speaking, most procedures are safe.  However, with any procedure there are risks, side effects, and the possibility of complications.  The risks and complications are dependent upon the sites that are lesioned, or the type of nerve block to be performed.  The closer the procedure is to the spine, the more serious the risks are.  Great care is taken when placing the radio frequency needles, block needles or lesioning probes, but sometimes complications can occur.  Infection: Any time there is an injection through the skin, there is a risk of infection.  This is why sterile conditions are used for these blocks.  There are four possible types of infection.  Localized skin infection.  Central Nervous System Infection-This can be in the form of Meningitis, which can be deadly.  Epidural Infections-This can be in the form of an epidural abscess, which can cause pressure inside of the spine, causing compression of the spinal cord with subsequent paralysis. This would require an emergency surgery to decompress, and there are no guarantees that the patient would recover from the paralysis.  Discitis-This is an infection of the intervertebral discs.  It occurs in about 1% of discography procedures.  It is difficult to treat and it may lead to surgery.        2. Pain: the needles have to go through skin and soft tissues, will cause soreness.       3. Damage to internal structures:  The nerves to be lesioned may be near blood vessels or    other nerves which can be potentially damaged.       4. Bleeding: Bleeding is more common if  the patient is taking blood thinners such as  aspirin, Coumadin, Ticiid, Plavix, etc., or if he/she have some genetic predisposition  such as hemophilia. Bleeding into the spinal canal can cause compression of the spinal  cord with subsequent paralysis.  This would require an emergency surgery to  decompress and there are no guarantees that the patient would recover from the  paralysis.       5. Pneumothorax:  Puncturing of a lung is a possibility, every time a needle is introduced in  the area of the chest or upper back.  Pneumothorax refers to free air around the  collapsed lung(s), inside of the thoracic cavity (chest cavity).  Another two possible  complications related to a similar event would include: Hemothorax and Chylothorax.   These are variations of the Pneumothorax, where instead of air around the collapsed  lung(s), you may have blood or chyle, respectively.       6. Spinal headaches: They may occur with any procedures in the area of the spine.       7. Persistent CSF (Cerebro-Spinal Fluid) leakage: This is a rare problem, but may occur  with prolonged intrathecal or epidural catheters either due to the formation of a fistulous  track or a dural tear.       8. Nerve damage: By working so close to the spinal cord, there is always a possibility of  nerve damage, which could be as serious as a permanent spinal cord injury with  paralysis.       9. Death:  Although rare, severe deadly allergic reactions known as "Anaphylactic  reaction" can occur to any of the medications used.      10. Worsening of the symptoms:  We can always make thing worse.  What are the chances of something like this happening? Chances of any of this occuring are extremely low.  By statistics, you have more of a chance of getting killed in a motor vehicle accident: while driving to the hospital than any of the above occurring .  Nevertheless, you should be aware that they are possibilities.  In general, it is similar to  taking a shower.  Everybody knows that you can slip, hit your head and get killed.  Does that mean that you should not shower again?  Nevertheless always keep in mind that statistics do not mean anything if you happen to be on the wrong side of them.  Even if a procedure has a 1 (one) in a 1,000,000 (million) chance of going wrong, it you  happen to be that one..Also, keep in mind that by statistics, you have more of a chance of having something go wrong when taking medications.  Who should not have this procedure? If you are on a blood thinning medication (e.g. Coumadin, Plavix, see list of "Blood Thinners"), or if you have an active infection going on, you should not have the procedure.  If you are taking any blood thinners, please inform your physician.  How should I prepare for this procedure?  Do not eat or drink anything at least six hours prior to the procedure.  Bring a driver with you .  It cannot be a taxi.  Come accompanied by an adult that can drive you back, and that is strong enough to help you if your legs get weak or numb from the local anesthetic.  Take all of your medicines the morning of the procedure with just enough water to swallow them.  If you have diabetes, make sure that you are scheduled to have your procedure done first thing in the morning, whenever possible.  If you have diabetes, take only half of your insulin dose and notify our nurse that you have done so as soon as you arrive at the clinic.  If you are diabetic, but only take blood sugar pills (oral hypoglycemic), then do not take them on the morning of your procedure.  You may take them after you have had the procedure.  Do not take aspirin or any aspirin-containing medications, at least eleven (11) days prior to the procedure.  They may prolong bleeding.  Wear loose fitting clothing that may be easy to take off and that you would not mind if it got stained with Betadine or blood.  Do not wear any jewelry or  perfume  Remove any nail coloring.  It will interfere with some of our monitoring equipment.  NOTE: Remember that this is not meant to be interpreted as a complete list of all possible complications.  Unforeseen problems may occur.  BLOOD THINNERS The following drugs contain aspirin or other products, which can cause increased bleeding during surgery and should not be taken for 2 weeks prior to and 1 week after surgery.  If you should need take something for relief of minor pain, you may take acetaminophen which is found in Tylenol,m Datril, Anacin-3 and Panadol. It is not blood thinner. The products listed below are.  Do not take any of the products listed below in addition to any listed on your instruction sheet.  A.P.C or A.P.C with Codeine Codeine Phosphate Capsules #3 Ibuprofen Ridaura  ABC compound Congesprin Imuran rimadil  Advil Cope Indocin Robaxisal  Alka-Seltzer Effervescent Pain Reliever and Antacid Coricidin or Coricidin-D  Indomethacin Rufen  Alka-Seltzer plus Cold Medicine Cosprin Ketoprofen S-A-C Tablets  Anacin Analgesic Tablets or Capsules Coumadin Korlgesic Salflex  Anacin Extra Strength Analgesic tablets or capsules CP-2 Tablets Lanoril Salicylate  Anaprox Cuprimine Capsules Levenox Salocol  Anexsia-D Dalteparin Magan Salsalate  Anodynos Darvon compound Magnesium Salicylate Sine-off  Ansaid Dasin Capsules Magsal Sodium Salicylate  Anturane Depen Capsules Marnal Soma  APF Arthritis pain formula Dewitt's Pills Measurin Stanback  Argesic Dia-Gesic Meclofenamic Sulfinpyrazone  Arthritis Bayer Timed Release Aspirin Diclofenac Meclomen Sulindac  Arthritis pain formula Anacin Dicumarol Medipren Supac  Analgesic (Safety coated) Arthralgen Diffunasal Mefanamic Suprofen  Arthritis Strength Bufferin Dihydrocodeine Mepro Compound Suprol  Arthropan liquid Dopirydamole Methcarbomol with Aspirin Synalgos  ASA tablets/Enseals Disalcid Micrainin Tagament  Ascriptin Doan's Midol  Talwin  Ascriptin A/D Dolene Mobidin Tanderil  Ascriptin Extra Strength Dolobid Moblgesic Ticlid  Ascriptin with Codeine Doloprin or Doloprin with Codeine Momentum Tolectin  Asperbuf Duoprin Mono-gesic Trendar  Aspergum Duradyne Motrin or Motrin IB Triminicin  Aspirin plain, buffered or enteric coated Durasal Myochrisine Trigesic  Aspirin Suppositories Easprin Nalfon Trillsate  Aspirin with Codeine Ecotrin Regular or Extra Strength Naprosyn Uracel  Atromid-S Efficin Naproxen Ursinus  Auranofin Capsules Elmiron Neocylate Vanquish  Axotal Emagrin Norgesic Verin  Azathioprine Empirin or Empirin with Codeine Normiflo Vitamin E  Azolid Emprazil Nuprin Voltaren  Bayer Aspirin plain, buffered or children's or timed BC Tablets or powders Encaprin Orgaran Warfarin Sodium  Buff-a-Comp Enoxaparin Orudis Zorpin  Buff-a-Comp with Codeine Equegesic Os-Cal-Gesic   Buffaprin Excedrin plain, buffered or Extra Strength Oxalid   Bufferin Arthritis Strength Feldene Oxphenbutazone   Bufferin plain or Extra Strength Feldene Capsules Oxycodone with Aspirin   Bufferin with Codeine Fenoprofen Fenoprofen Pabalate or Pabalate-SF   Buffets II Flogesic Panagesic   Buffinol plain or Extra Strength Florinal or Florinal with Codeine Panwarfarin   Buf-Tabs Flurbiprofen Penicillamine   Butalbital Compound Four-way cold tablets Penicillin   Butazolidin Fragmin Pepto-Bismol   Carbenicillin Geminisyn Percodan   Carna Arthritis Reliever Geopen Persantine   Carprofen Gold's salt Persistin   Chloramphenicol Goody's Phenylbutazone   Chloromycetin Haltrain Piroxlcam   Clmetidine heparin Plaquenil   Cllnoril Hyco-pap Ponstel   Clofibrate Hydroxy chloroquine Propoxyphen         Before stopping any of these medications, be sure to consult the physician who ordered them.  Some, such as Coumadin (Warfarin) are ordered to prevent or treat serious conditions such as "deep thrombosis", "pumonary embolisms", and other heart  problems.  The amount of time that you may need off of the medication may also vary with the medication and the reason for which you were taking it.  If you are taking any of these medications, please make sure you notify your pain physician before you undergo any procedures.

## 2015-05-24 ENCOUNTER — Telehealth: Payer: Self-pay

## 2015-05-24 ENCOUNTER — Other Ambulatory Visit: Payer: Self-pay | Admitting: Internal Medicine

## 2015-05-24 MED ORDER — ZOLPIDEM TARTRATE ER 12.5 MG PO TBCR: 12.5000 mg | EXTENDED_RELEASE_TABLET | Freq: Every evening | ORAL | Status: DC | PRN

## 2015-05-24 NOTE — Telephone Encounter (Signed)
Rx called into pharmacy and med list updated, pt notified

## 2015-05-24 NOTE — Telephone Encounter (Signed)
Pt left v/m; pt seen 05/12/15; Lorrin Mais was switched to lunesta and pt having severe depression; per side effects pt thinks lunesta is causing depression.pt has been taking zoloft for depression but after taking the lunesta pt said the depression has worsened. Pt did not take Lunesta on 05/23/15.No SI/HI. Pt wants to stop the lunesta and go back on strongest dosage of ambien pt can take. Pt request cb. [place]. If pt condition changes or worsens prior to cb pt will call [place].

## 2015-05-24 NOTE — Telephone Encounter (Signed)
Please d/c lunesta off med list call in Ambien 12.5 mg CR #30, 0 refills

## 2015-05-24 NOTE — Addendum Note (Signed)
Addended by: Tammi Sou on: 05/24/2015 04:26 PM   Modules accepted: Orders, Medications

## 2015-06-05 ENCOUNTER — Other Ambulatory Visit: Payer: Self-pay | Admitting: Internal Medicine

## 2015-06-09 ENCOUNTER — Other Ambulatory Visit: Payer: Self-pay | Admitting: Internal Medicine

## 2015-06-20 ENCOUNTER — Ambulatory Visit: Payer: BLUE CROSS/BLUE SHIELD | Attending: Pain Medicine | Admitting: Pain Medicine

## 2015-06-20 ENCOUNTER — Encounter: Payer: Self-pay | Admitting: Pain Medicine

## 2015-06-20 ENCOUNTER — Other Ambulatory Visit: Payer: Self-pay | Admitting: Internal Medicine

## 2015-06-20 VITALS — BP 156/86 | HR 74 | Temp 97.5°F | Resp 16 | Ht 68.0 in | Wt 180.0 lb

## 2015-06-20 DIAGNOSIS — M47817 Spondylosis without myelopathy or radiculopathy, lumbosacral region: Secondary | ICD-10-CM

## 2015-06-20 DIAGNOSIS — M19011 Primary osteoarthritis, right shoulder: Secondary | ICD-10-CM | POA: Insufficient documentation

## 2015-06-20 DIAGNOSIS — Z981 Arthrodesis status: Secondary | ICD-10-CM | POA: Diagnosis not present

## 2015-06-20 DIAGNOSIS — M533 Sacrococcygeal disorders, not elsewhere classified: Secondary | ICD-10-CM | POA: Diagnosis not present

## 2015-06-20 DIAGNOSIS — M19012 Primary osteoarthritis, left shoulder: Secondary | ICD-10-CM | POA: Diagnosis not present

## 2015-06-20 DIAGNOSIS — M5136 Other intervertebral disc degeneration, lumbar region: Secondary | ICD-10-CM | POA: Insufficient documentation

## 2015-06-20 DIAGNOSIS — M461 Sacroiliitis, not elsewhere classified: Secondary | ICD-10-CM

## 2015-06-20 DIAGNOSIS — M961 Postlaminectomy syndrome, not elsewhere classified: Secondary | ICD-10-CM

## 2015-06-20 DIAGNOSIS — Z9889 Other specified postprocedural states: Secondary | ICD-10-CM | POA: Insufficient documentation

## 2015-06-20 DIAGNOSIS — M25519 Pain in unspecified shoulder: Secondary | ICD-10-CM | POA: Diagnosis present

## 2015-06-20 DIAGNOSIS — M47818 Spondylosis without myelopathy or radiculopathy, sacral and sacrococcygeal region: Secondary | ICD-10-CM

## 2015-06-20 DIAGNOSIS — M47816 Spondylosis without myelopathy or radiculopathy, lumbar region: Secondary | ICD-10-CM

## 2015-06-20 MED ORDER — OXYCODONE-ACETAMINOPHEN 7.5-325 MG PO TABS: ORAL_TABLET | ORAL | Status: DC

## 2015-06-20 MED ORDER — GABAPENTIN 300 MG PO CAPS: ORAL_CAPSULE | ORAL | Status: DC

## 2015-06-20 NOTE — Telephone Encounter (Signed)
Last filled 05/24/2015--please advise if okay to call in on Fri 06/23/15

## 2015-06-20 NOTE — Telephone Encounter (Signed)
Ok to phone in Ambien 

## 2015-06-20 NOTE — Progress Notes (Signed)
Safety precautions to be maintained throughout the outpatient stay will include: orient to surroundings, keep bed in low position, maintain call bell within reach at all times, provide assistance with transfer out of bed and ambulation.  

## 2015-06-20 NOTE — Progress Notes (Signed)
Subjective:    Patient ID: Derrick Kelley, male    DOB: Mar 26, 1960, 55 y.o.   MRN: [id number]  HPI  The patient is a 55 year old gentleman who returns to pain management for further evaluation and treatment of pain involving the shoulder upper mid lower back lower extremity region with predominant pain involves the lower back and lower extremity region. The patient is status post surgery of the left shoulder performed by Dr. Tamera Punt. There is been concern regarding tear of muscle in the region of the left shoulder. The patient continues under the care of Dr. Tamera Punt in this regard. The patient is a pain involving the lower back lower extremity region a moderate to moderately severe degree and states that he is in hopes of being able to undergo radiofrequency rhizolysis lumbar facet, medial branch nerve radiofrequency rhizolysis at time return appointment. She will continue presently prescribed medications consisting of Neurontin and Percocet and Mobic. The patient denied any trauma change in events of daily living to cause significant change in symptomatology. We will proceed with radiofrequency rhizolysis lumbar facet, medial branch nerves to be performed at time return appointment as discussed interventional today's visit there is also been discussion regarding patient undergoing MRI of the cervical region and pending follow-up evaluation with Dr. Tamera Punt and patient's response pain involving the region of the shoulder cervical MRI may be considered as discussed.      Review of Systems     Objective:   Physical Exam  There was tends to palpation of paraspinal misreading cervical region cervical facet region palpation which be produced pain of moderate degree with moderate tenderness of the splenius capitis and occipitalis musculature regions. Palpation over the region of the acromioclavicular and glenohumeral joint region reproduced moderately severe discomfort. The patient appeared to be  with decreased grip strength. Tinel and Phalen's maneuver were associated with minimal increase pain. Palpation over the region of the thoracic facet thoracic paraspinal muscular region was attends to palpation with no crepitus of the thoracic region noted. Palpation over the lower thoracic paraspinal must reason associated with moderate muscle spasm. Palpation over the lumbar paraspinal must reason lumbar facet region associated with moderate severe discomfort with lateral bending rotation extension and palpation of the lumbar facets reproducing moderately severe discomfort on the left as well as on the right. There was severe tenderness to palpation the region of the PSIS and PII S region as well as the gluteal and piriformis musculature region. Straight leg raise was tolerates approximately 20 without a definite increase of pain with dorsiflexion noted. There was increase of pain with pressure applied to the ileum with patient in lateral decubitus position as well as palpation of the PSIS and PII S regions. There was negative clonus negative Homans. Abdomen nontender with no costovertebral tenderness noted.      Assessment & Plan:      Degenerative disc disease lumbar spine Status post L4-L5 posterior lumbar interbody fusion and decompression of the central canal with solid fusion L5-S1, L3-4 degenerative disc disease, moderate facet arthrosis.  Lumbar facet syndrome  Sacroiliac joint dysfunction  Degenerative joint disease of shoulders Status post surgery of shoulder       PLAN   Continue present medications Mobic Neurontin(gabapentin) and Percocet  CAUTION Mobic can cause damage to kidney and liver and stomach as discussed  Lumbar facet, medial branch nerve blocks to be performed at time return appointment  Ask Angie and Shatoya if insurance has approved you  for radiofrequency rhizolysis lumbar  facet, medial branch nerves  F/U PCP R Baity for evaliation of  BP and general  medical condition  F/U surgical evaluation. Patient will follow-up with Dr. Tamera Punt for evaluation of shoulder. May consider cervical MRI for further assessment of pain and paresthesias of upper extremity pending evaluation by Dr. Tamera Punt May consider additional evaluation of lumbar and lower extremity pain as well  F/U neurological evaluation. May consider [facility] EMG studies and other studies  May consider radiofrequency rhizolysis or intraspinal procedures pending response to present treatment and F/U evaluation . As discussed we are waiting for insurance approval  Patient to call [facility] should patient have concerns prior to scheduled return appointment

## 2015-06-20 NOTE — Patient Instructions (Addendum)
PLAN   Continue present medications Mobic Neurontin(gabapentin) and Percocet  CAUTION Mobic can cause damage to kidney and liver and stomach as discussed  Lumbar facet, medial branch nerve blocks to be performed at time return appointment  Ask Angie and Shatoya if insurance has approved you  for radiofrequency rhizolysis lumbar facet, medial branch nerves  F/U PCP R Baity for evaliation of  BP and general medical condition  F/U surgical evaluation. Patient will follow-up with Dr. Tamera Punt for evaluation of shoulder. May consider cervical MRI for further assessment of pain and paresthesias of upper extremity pending evaluation by Dr. Tamera Punt May consider additional evaluation of lumbar and lower extremity pain as well  F/U neurological evaluation. May consider [facility] EMG studies and other studies  May consider radiofrequency rhizolysis or intraspinal procedures pending response to present treatment and F/U evaluation . As discussed we are waiting for insurance approval  Patient to call [facility] should patient have concerns prior to scheduled return appointment.GENERAL RISKS AND COMPLICATIONS  What are the risk, side effects and possible complications? Generally speaking, most procedures are safe.  However, with any procedure there are risks, side effects, and the possibility of complications.  The risks and complications are dependent upon the sites that are lesioned, or the type of nerve block to be performed.  The closer the procedure is to the spine, the more serious the risks are.  Great care is taken when placing the radio frequency needles, block needles or lesioning probes, but sometimes complications can occur. 1. Infection: Any time there is an injection through the skin, there is a risk of infection.  This is why sterile conditions are used for these blocks.  There are four possible types of infection. 1. Localized skin infection. 2. Central Nervous System Infection-This  can be in the form of Meningitis, which can be deadly. 3. Epidural Infections-This can be in the form of an epidural abscess, which can cause pressure inside of the spine, causing compression of the spinal cord with subsequent paralysis. This would require an emergency surgery to decompress, and there are no guarantees that the patient would recover from the paralysis. 4. Discitis-This is an infection of the intervertebral discs.  It occurs in about 1% of discography procedures.  It is difficult to treat and it may lead to surgery.        2. Pain: the needles have to go through skin and soft tissues, will cause soreness.       3. Damage to internal structures:  The nerves to be lesioned may be near blood vessels or    other nerves which can be potentially damaged.       4. Bleeding: Bleeding is more common if the patient is taking blood thinners such as  aspirin, Coumadin, Ticiid, Plavix, etc., or if he/she have some genetic predisposition  such as hemophilia. Bleeding into the spinal canal can cause compression of the spinal  cord with subsequent paralysis.  This would require an emergency surgery to  decompress and there are no guarantees that the patient would recover from the  paralysis.       5. Pneumothorax:  Puncturing of a lung is a possibility, every time a needle is introduced in  the area of the chest or upper back.  Pneumothorax refers to free air around the  collapsed lung(s), inside of the thoracic cavity (chest cavity).  Another two possible  complications related to a similar event would include: Hemothorax and Chylothorax.   These  are variations of the Pneumothorax, where instead of air around the collapsed  lung(s), you may have blood or chyle, respectively.       6. Spinal headaches: They may occur with any procedures in the area of the spine.       7. Persistent CSF (Cerebro-Spinal Fluid) leakage: This is a rare problem, but may occur  with prolonged intrathecal or epidural catheters  either due to the formation of a fistulous  track or a dural tear.       8. Nerve damage: By working so close to the spinal cord, there is always a possibility of  nerve damage, which could be as serious as a permanent spinal cord injury with  paralysis.       9. Death:  Although rare, severe deadly allergic reactions known as "Anaphylactic  reaction" can occur to any of the medications used.      10. Worsening of the symptoms:  We can always make thing worse.  What are the chances of something like this happening? Chances of any of this occuring are extremely low.  By statistics, you have more of a chance of getting killed in a motor vehicle accident: while driving to the hospital than any of the above occurring .  Nevertheless, you should be aware that they are possibilities.  In general, it is similar to taking a shower.  Everybody knows that you can slip, hit your head and get killed.  Does that mean that you should not shower again?  Nevertheless always keep in mind that statistics do not mean anything if you happen to be on the wrong side of them.  Even if a procedure has a 1 (one) in a 1,000,000 (million) chance of going wrong, it you happen to be that one..Also, keep in mind that by statistics, you have more of a chance of having something go wrong when taking medications.  Who should not have this procedure? If you are on a blood thinning medication (e.g. Coumadin, Plavix, see list of "Blood Thinners"), or if you have an active infection going on, you should not have the procedure.  If you are taking any blood thinners, please inform your physician.  How should I prepare for this procedure?  Do not eat or drink anything at least six hours prior to the procedure.  Bring a driver with you .  It cannot be a taxi.  Come accompanied by an adult that can drive you back, and that is strong enough to help you if your legs get weak or numb from the local anesthetic.  Take all of your medicines the  morning of the procedure with just enough water to swallow them.  If you have diabetes, make sure that you are scheduled to have your procedure done first thing in the morning, whenever possible.  If you have diabetes, take only half of your insulin dose and notify our nurse that you have done so as soon as you arrive at the clinic.  If you are diabetic, but only take blood sugar pills (oral hypoglycemic), then do not take them on the morning of your procedure.  You may take them after you have had the procedure.  Do not take aspirin or any aspirin-containing medications, at least eleven (11) days prior to the procedure.  They may prolong bleeding.  Wear loose fitting clothing that may be easy to take off and that you would not mind if it got stained with Betadine or blood.  Do  not wear any jewelry or perfume  Remove any nail coloring.  It will interfere with some of our monitoring equipment.  NOTE: Remember that this is not meant to be interpreted as a complete list of all possible complications.  Unforeseen problems may occur.  BLOOD THINNERS The following drugs contain aspirin or other products, which can cause increased bleeding during surgery and should not be taken for 2 weeks prior to and 1 week after surgery.  If you should need take something for relief of minor pain, you may take acetaminophen which is found in Tylenol,m Datril, Anacin-3 and Panadol. It is not blood thinner. The products listed below are.  Do not take any of the products listed below in addition to any listed on your instruction sheet.  A.P.C or A.P.C with Codeine Codeine Phosphate Capsules #3 Ibuprofen Ridaura  ABC compound Congesprin Imuran rimadil  Advil Cope Indocin Robaxisal  Alka-Seltzer Effervescent Pain Reliever and Antacid Coricidin or Coricidin-D  Indomethacin Rufen  Alka-Seltzer plus Cold Medicine Cosprin Ketoprofen S-A-C Tablets  Anacin Analgesic Tablets or Capsules Coumadin Korlgesic Salflex  Anacin  Extra Strength Analgesic tablets or capsules CP-2 Tablets Lanoril Salicylate  Anaprox Cuprimine Capsules Levenox Salocol  Anexsia-D Dalteparin Magan Salsalate  Anodynos Darvon compound Magnesium Salicylate Sine-off  Ansaid Dasin Capsules Magsal Sodium Salicylate  Anturane Depen Capsules Marnal Soma  APF Arthritis pain formula Dewitt's Pills Measurin Stanback  Argesic Dia-Gesic Meclofenamic Sulfinpyrazone  Arthritis Bayer Timed Release Aspirin Diclofenac Meclomen Sulindac  Arthritis pain formula Anacin Dicumarol Medipren Supac  Analgesic (Safety coated) Arthralgen Diffunasal Mefanamic Suprofen  Arthritis Strength Bufferin Dihydrocodeine Mepro Compound Suprol  Arthropan liquid Dopirydamole Methcarbomol with Aspirin Synalgos  ASA tablets/Enseals Disalcid Micrainin Tagament  Ascriptin Doan's Midol Talwin  Ascriptin A/D Dolene Mobidin Tanderil  Ascriptin Extra Strength Dolobid Moblgesic Ticlid  Ascriptin with Codeine Doloprin or Doloprin with Codeine Momentum Tolectin  Asperbuf Duoprin Mono-gesic Trendar  Aspergum Duradyne Motrin or Motrin IB Triminicin  Aspirin plain, buffered or enteric coated Durasal Myochrisine Trigesic  Aspirin Suppositories Easprin Nalfon Trillsate  Aspirin with Codeine Ecotrin Regular or Extra Strength Naprosyn Uracel  Atromid-S Efficin Naproxen Ursinus  Auranofin Capsules Elmiron Neocylate Vanquish  Axotal Emagrin Norgesic Verin  Azathioprine Empirin or Empirin with Codeine Normiflo Vitamin E  Azolid Emprazil Nuprin Voltaren  Bayer Aspirin plain, buffered or children's or timed BC Tablets or powders Encaprin Orgaran Warfarin Sodium  Buff-a-Comp Enoxaparin Orudis Zorpin  Buff-a-Comp with Codeine Equegesic Os-Cal-Gesic   Buffaprin Excedrin plain, buffered or Extra Strength Oxalid   Bufferin Arthritis Strength Feldene Oxphenbutazone   Bufferin plain or Extra Strength Feldene Capsules Oxycodone with Aspirin   Bufferin with Codeine Fenoprofen Fenoprofen Pabalate or  Pabalate-SF   Buffets II Flogesic Panagesic   Buffinol plain or Extra Strength Florinal or Florinal with Codeine Panwarfarin   Buf-Tabs Flurbiprofen Penicillamine   Butalbital Compound Four-way cold tablets Penicillin   Butazolidin Fragmin Pepto-Bismol   Carbenicillin Geminisyn Percodan   Carna Arthritis Reliever Geopen Persantine   Carprofen Gold's salt Persistin   Chloramphenicol Goody's Phenylbutazone   Chloromycetin Haltrain Piroxlcam   Clmetidine heparin Plaquenil   Cllnoril Hyco-pap Ponstel   Clofibrate Hydroxy chloroquine Propoxyphen         Before stopping any of these medications, be sure to consult the physician who ordered them.  Some, such as Coumadin (Warfarin) are ordered to prevent or treat serious conditions such as "deep thrombosis", "pumonary embolisms", and other heart problems.  The amount of time that you may need off of the medication  may also vary with the medication and the reason for which you were taking it.  If you are taking any of these medications, please make sure you notify your pain physician before you undergo any procedures.         Facet Joint Block The facet joints connect the bones of the spine (vertebrae). They make it possible for you to bend, twist, and make other movements with your spine. They also prevent you from overbending, overtwisting, and making other excessive movements.  A facet joint block is a procedure where a numbing medicine (anesthetic) is injected into a facet joint. Often, a type of anti-inflammatory medicine called a steroid is also injected. A facet joint block may be done for two reasons:  2. Diagnosis. A facet joint block may be done as a test to see whether neck or back pain is caused by a worn-down or infected facet joint. If the pain gets better after a facet joint block, it means the pain is probably coming from the facet joint. If the pain does not get better, it means the pain is probably not coming from the facet  joint.  3. Therapy. A facet joint block may be done to relieve neck or back pain caused by a facet joint. A facet joint block is only done as a therapy if the pain does not improve with medicine, exercise programs, physical therapy, and other forms of pain management. LET [facility] CARE PROVIDER KNOW ABOUT:   Any allergies you have.   All medicines you are taking, including vitamins, herbs, eyedrops, and over-the-counter medicines and creams.   Previous problems you or members of your family have had with the use of anesthetics.   Any blood disorders you have had.   Other health problems you have. RISKS AND COMPLICATIONS Generally, having a facet joint block is safe. However, as with any procedure, complications can occur. Possible complications associated with having a facet joint block include:   Bleeding.   Injury to a nerve near the injection site.   Pain at the injection site.   Weakness or numbness in areas controlled by nerves near the injection site.   Infection.   Temporary fluid retention.   Allergic reaction to anesthetics or medicines used during the procedure. BEFORE THE PROCEDURE   Follow your health care provider's instructions if you are taking dietary supplements or medicines. You may need to stop taking them or reduce your dosage.   Do not take any new dietary supplements or medicines without asking your health care provider first.   Follow your health care provider's instructions about eating and drinking before the procedure. You may need to stop eating and drinking several hours before the procedure.   Arrange to have an adult drive you home after the procedure. PROCEDURE 12. You may need to remove your clothing and dress in an open-back gown so that your health care provider can access your spine.  13. The procedure will be done while you are lying on an X-ray table. Most of the time you will be asked to lie on your stomach, but you may  be asked to lie in a different position if an injection will be made in your neck.  14. Special machines will be used to monitor your oxygen levels, heart rate, and blood pressure.  15. If an injection will be made in your neck, an intravenous (IV) tube will be inserted into one of your veins. Fluids and medicine will flow directly into  your body through the IV tube.  16. The area over the facet joint where the injection will be made will be cleaned with an antiseptic soap. The surrounding skin will be covered with sterile drapes.  17. An anesthetic will be applied to your skin to make the injection area numb. You may feel a temporary stinging or burning sensation.  18. A video X-ray machine will be used to locate the joint. A contrast dye may be injected into the facet joint area to help with locating the joint.  19. When the joint is located, an anesthetic medicine will be injected into the joint through the needle.  50. Your health care provider will ask you whether you feel pain relief. If you do feel relief, a steroid may be injected to provide pain relief for a longer period of time. If you do not feel relief or feel only partial relief, additional injections of an anesthetic may be made in other facet joints.  21. The needle will be removed, the skin will be cleansed, and bandages will be applied.  AFTER THE PROCEDURE   You will be observed for 15-30 minutes before being allowed to go home. Do not drive. Have an adult drive you or take a taxi or public transportation instead.   If you feel pain relief, the pain will return in several hours or days when the anesthetic wears off.   You may feel pain relief 2-14 days after the procedure. The amount of time this relief lasts varies from person to person.   It is normal to feel some tenderness over the injected area(s) for 2 days following the procedure.   If you have diabetes, you may have a temporary increase in blood sugar.    This information is not intended to replace advice given to you by your health care provider. Make sure you discuss any questions you have with your health care provider.   Document Released: 06/19/2006 Document Revised: 02/18/2014 Document Reviewed: 11/18/2011 Elsevier Interactive Patient Education [employer].

## 2015-06-21 ENCOUNTER — Other Ambulatory Visit: Payer: Self-pay | Admitting: Internal Medicine

## 2015-06-22 NOTE — Telephone Encounter (Signed)
Pt said [facility] did not have rx. Medication phoned to [place] at [facility] as instructed.pt will ck with pharmacy for pick up.

## 2015-06-23 NOTE — Telephone Encounter (Signed)
Rx called in to pharmacy. 

## 2015-07-05 ENCOUNTER — Encounter: Payer: Self-pay | Admitting: Pain Medicine

## 2015-07-05 ENCOUNTER — Ambulatory Visit: Payer: BLUE CROSS/BLUE SHIELD | Attending: Pain Medicine | Admitting: Pain Medicine

## 2015-07-05 VITALS — BP 120/73 | HR 58 | Temp 97.9°F | Resp 12 | Ht 68.0 in | Wt 180.0 lb

## 2015-07-05 DIAGNOSIS — Z981 Arthrodesis status: Secondary | ICD-10-CM | POA: Insufficient documentation

## 2015-07-05 DIAGNOSIS — M19011 Primary osteoarthritis, right shoulder: Secondary | ICD-10-CM

## 2015-07-05 DIAGNOSIS — M79606 Pain in leg, unspecified: Secondary | ICD-10-CM | POA: Diagnosis present

## 2015-07-05 DIAGNOSIS — M47818 Spondylosis without myelopathy or radiculopathy, sacral and sacrococcygeal region: Secondary | ICD-10-CM

## 2015-07-05 DIAGNOSIS — M961 Postlaminectomy syndrome, not elsewhere classified: Secondary | ICD-10-CM

## 2015-07-05 DIAGNOSIS — M545 Low back pain: Secondary | ICD-10-CM | POA: Insufficient documentation

## 2015-07-05 DIAGNOSIS — M5136 Other intervertebral disc degeneration, lumbar region: Secondary | ICD-10-CM | POA: Insufficient documentation

## 2015-07-05 DIAGNOSIS — M47817 Spondylosis without myelopathy or radiculopathy, lumbosacral region: Secondary | ICD-10-CM

## 2015-07-05 DIAGNOSIS — M533 Sacrococcygeal disorders, not elsewhere classified: Secondary | ICD-10-CM

## 2015-07-05 DIAGNOSIS — M19012 Primary osteoarthritis, left shoulder: Secondary | ICD-10-CM

## 2015-07-05 DIAGNOSIS — M47816 Spondylosis without myelopathy or radiculopathy, lumbar region: Secondary | ICD-10-CM

## 2015-07-05 DIAGNOSIS — M461 Sacroiliitis, not elsewhere classified: Secondary | ICD-10-CM

## 2015-07-05 MED ORDER — MIDAZOLAM HCL 5 MG/5ML IJ SOLN
5.0000 mg | Freq: Once | INTRAMUSCULAR | Status: AC
  Administered 2015-07-05: 5 mg via INTRAVENOUS
  Filled 2015-07-05: qty 5

## 2015-07-05 MED ORDER — CEFUROXIME AXETIL 250 MG PO TABS: 250.0000 mg | ORAL_TABLET | Freq: Two times a day (BID) | ORAL | Status: DC

## 2015-07-05 MED ORDER — ORPHENADRINE CITRATE 30 MG/ML IJ SOLN
60.0000 mg | Freq: Once | INTRAMUSCULAR | Status: DC
  Filled 2015-07-05: qty 2

## 2015-07-05 MED ORDER — BUPIVACAINE HCL (PF) 0.25 % IJ SOLN
30.0000 mL | Freq: Once | INTRAMUSCULAR | Status: AC
  Administered 2015-07-05: 30 mL
  Filled 2015-07-05: qty 30

## 2015-07-05 MED ORDER — TRIAMCINOLONE ACETONIDE 40 MG/ML IJ SUSP
40.0000 mg | Freq: Once | INTRAMUSCULAR | Status: AC
  Administered 2015-07-05: 40 mg
  Filled 2015-07-05: qty 1

## 2015-07-05 MED ORDER — LACTATED RINGERS IV SOLN: 1000.0000 mL | INTRAVENOUS | Status: DC

## 2015-07-05 MED ORDER — FENTANYL CITRATE (PF) 100 MCG/2ML IJ SOLN
100.0000 ug | Freq: Once | INTRAMUSCULAR | Status: AC
  Administered 2015-07-05: 100 ug via INTRAVENOUS
  Filled 2015-07-05: qty 2

## 2015-07-05 MED ORDER — CEFAZOLIN SODIUM 1-5 GM-% IV SOLN
1.0000 g | Freq: Once | INTRAVENOUS | Status: AC
  Administered 2015-07-05: 1 g via INTRAVENOUS

## 2015-07-05 NOTE — Progress Notes (Signed)
Subjective:    Patient ID: Derrick Kelley, male    DOB: 07-11-1960, 55 y.o.   MRN: [id number]  HPI  PROCEDURE PERFORMED: Lumbar facet (medial branch block)   NOTE: The patient is a 55 y.o. male who returns to [place] for further evaluation and treatment of pain involving the lumbar and lower extremity region. MRI  revealed the patient to be with evidence of degenerative disc disease lumbar spine Status post L4-L5 posterior lumbar interbody fusion and decompression of the central canal with solid fusion L5-S1, L3-4 degenerative disc disease, moderate facet arthrosis. There is concern regarding significant component of patient's pain began due to lumbar facet syndrome... The risks, benefits, and expectations of the procedure have been discussed and explained to the patient who was understanding and in agreement with suggested treatment plan. We will proceed with interventional treatment as discussed and as explained to the patient who was understanding and wished to proceed with procedure as planned.   DESCRIPTION OF PROCEDURE: Lumbar facet (medial branch block) with IV Versed, IV fentanyl conscious sedation, EKG, blood pressure, pulse, capnography, and pulse oximetry monitoring. The procedure was performed with the patient in the prone position. Betadine prep of proposed entry site performed.   NEEDLE PLACEMENT AT: Left L 1 lumbar facet (medial branch block). Under fluoroscopic guidance with oblique orientation of 15 degrees, a 22-gauge needle was inserted at the L 1 vertebral body level with needle placed at the targeted area of Burton's Eye or Eye of the Scotty Dog with documentation of needle placement in the superior and lateral border of targeted area of Burton's Eye or Eye of the Scotty Dog with oblique orientation of 15 degrees. Following documentation of needle placement at the L 1 vertebral body level, needle placement was then accomplished at the L 2 vertebral body level.    NEEDLE PLACEMENT AT L2 and L3 VERTEBRAL BODY LEVELS ON THE LEFT SIDE The procedure was performed at the L2 and L3 vertebral body levels exactly as was performed at the L 1 vertebral body level utilizing the same technique and under fluoroscopic guidance.  NEEDLE PLACEMENT AT THE SACRAL ALA with AP view of the lumbosacral spine. With the patient in the prone position, Betadine prep of proposed entry site accomplished, a 22 gauge needle was inserted in the region of the sacral ala (groove formed by the superior articulating process of S1 and the sacral wing). Following documentation of needle placement at the sacral ala,   NEEDLE PLACEMENT AT THE S1 FORAMEN LEVEL under fluoroscopic guidance with AP view of the lumbosacral spine and cephalad orientation of the fluoroscope, a 22-gauge needle was placed at the superior and lateral border of the S1 foramen under fluoroscopic guidance. Following documentation of needle placement at the S1 foramen.   Needle placement was then verified at all levels on lateral view. Following documentation of needle placement at all levels on lateral view and following negative aspiration for heme and CSF, each level was injected with 1 mL of 0.25% bupivacaine with Kenalog.     LUMBAR FACET, MEDIAL BRANCH NERVE, BLOCKS PERFORMED ON THE RIGHT SIDE   The procedure was performed on the right side exactly as was performed on the left side at the same levels and utilizing the same technique under fluoroscopic guidance.     The patient tolerated the procedure well. A total of 40 mg of Kenalog was utilized for the procedure.   PLAN:  1. Medications: The patient will continue presently prescribed medications  Neurontin Mobic and oxycodone acetaminophen 2. May consider modification of treatment regimen at time of return appointment pending response to treatment rendered on today's visit. 3. The patient is to follow-up with primary care physician R Baity for further  evaluation of blood pressure and general medical condition status post steroid injection performed on today's visit. 4. Surgical follow-up evaluation.. Patient will follow-up with Dr. Tamera Punt regarding shoulder 5. MRI of the cervical spine and [facility] EMG studies of the upper extremities have been ordered. Please ask the nurses and secretary the date of your studies for further evaluation of pain of the cervical and upper extremity regions with upper extremity weakness as well. 6. Neurological follow-up evaluation. 7. The patient may be candidate for radiofrequency procedures, implantation type procedures, and other treatment pending response to treatment and follow-up evaluation. 8. The patient has been advised to call the [facility] prior to scheduled return appointment should there be significant change in condition or should patient have other concerns regarding condition prior to scheduled return appointment.  The patient is understanding and in agreement with suggested treatment plan.   Review of Systems     Objective:   Physical Exam        Assessment & Plan:

## 2015-07-05 NOTE — Patient Instructions (Addendum)
PLAN   Continue present medications Mobic Neurontin(gabapentin) and Percocet  CAUTION Mobic can cause damage to kidney and liver and stomach as discussed. Please obtain Ceftin antibiotic today and begin taking Ceftin antibiotic today as prescribed    F/U PCP R Baity for evaliation of  BP and general medical condition  F/U surgical evaluation. Patient will follow-up with Dr. Tamera Punt for evaluation of shoulder. Cervical MRI and PNCV/EMG studies for further assessment of pain and paresthesias of upper extremities  May consider additional evaluation of lumbar and lower extremity pain as well  F/U neurological evaluation.  PNCV EMG studies and other studies. Order has been placed for PNCV/EMG studies.  May consider radiofrequency rhizolysis or intraspinal procedures pending response to present treatment and F/U evaluation . As discussed we are waiting for insurance approval  Patient to call [facility] should patient have concerns prior to scheduled return appointment.Pain Management Discharge Instructions  General Discharge Instructions :  If you need to reach your doctor call: Monday-Friday 8:00 am - 4:00 pm at [phone] or toll free [phone].  After clinic hours [phone] to have operator reach doctor.  Bring all of your medication bottles to all your appointments in the pain clinic.  To cancel or reschedule your appointment with Pain Management please remember to call 24 hours in advance to avoid a fee.  Refer to the educational materials which you have been given on: General Risks, I had my Procedure. Discharge Instructions, Post Sedation.  Post Procedure Instructions:  The drugs you were given will stay in your system until tomorrow, so for the next 24 hours you should not drive, make any legal decisions or drink any alcoholic beverages.  You may eat anything you prefer, but it is better to start with liquids then soups and crackers, and gradually work up to solid  foods.  Please notify your doctor immediately if you have any unusual bleeding, trouble breathing or pain that is not related to your normal pain.  Depending on the type of procedure that was done, some parts of your body may feel week and/or numb.  This usually clears up by tonight or the next day.  Walk with the use of an assistive device or accompanied by an adult for the 24 hours.  You may use ice on the affected area for the first 24 hours.  Put ice in a Ziploc bag and cover with a towel and place against area 15 minutes on 15 minutes off.  You may switch to heat after 24 hours.GENERAL RISKS AND COMPLICATIONS  What are the risk, side effects and possible complications? Generally speaking, most procedures are safe.  However, with any procedure there are risks, side effects, and the possibility of complications.  The risks and complications are dependent upon the sites that are lesioned, or the type of nerve block to be performed.  The closer the procedure is to the spine, the more serious the risks are.  Great care is taken when placing the radio frequency needles, block needles or lesioning probes, but sometimes complications can occur. 1. Infection: Any time there is an injection through the skin, there is a risk of infection.  This is why sterile conditions are used for these blocks.  There are four possible types of infection. 1. Localized skin infection. 2. Central Nervous System Infection-This can be in the form of Meningitis, which can be deadly. 3. Epidural Infections-This can be in the form of an epidural abscess, which can cause pressure inside of the  spine, causing compression of the spinal cord with subsequent paralysis. This would require an emergency surgery to decompress, and there are no guarantees that the patient would recover from the paralysis. 4. Discitis-This is an infection of the intervertebral discs.  It occurs in about 1% of discography procedures.  It is difficult to  treat and it may lead to surgery.        2. Pain: the needles have to go through skin and soft tissues, will cause soreness.       3. Damage to internal structures:  The nerves to be lesioned may be near blood vessels or    other nerves which can be potentially damaged.       4. Bleeding: Bleeding is more common if the patient is taking blood thinners such as  aspirin, Coumadin, Ticiid, Plavix, etc., or if he/she have some genetic predisposition  such as hemophilia. Bleeding into the spinal canal can cause compression of the spinal  cord with subsequent paralysis.  This would require an emergency surgery to  decompress and there are no guarantees that the patient would recover from the  paralysis.       5. Pneumothorax:  Puncturing of a lung is a possibility, every time a needle is introduced in  the area of the chest or upper back.  Pneumothorax refers to free air around the  collapsed lung(s), inside of the thoracic cavity (chest cavity).  Another two possible  complications related to a similar event would include: Hemothorax and Chylothorax.   These are variations of the Pneumothorax, where instead of air around the collapsed  lung(s), you may have blood or chyle, respectively.       6. Spinal headaches: They may occur with any procedures in the area of the spine.       7. Persistent CSF (Cerebro-Spinal Fluid) leakage: This is a rare problem, but may occur  with prolonged intrathecal or epidural catheters either due to the formation of a fistulous  track or a dural tear.       8. Nerve damage: By working so close to the spinal cord, there is always a possibility of  nerve damage, which could be as serious as a permanent spinal cord injury with  paralysis.       9. Death:  Although rare, severe deadly allergic reactions known as "Anaphylactic  reaction" can occur to any of the medications used.      10. Worsening of the symptoms:  We can always make thing worse.  What are the chances of something  like this happening? Chances of any of this occuring are extremely low.  By statistics, you have more of a chance of getting killed in a motor vehicle accident: while driving to the hospital than any of the above occurring .  Nevertheless, you should be aware that they are possibilities.  In general, it is similar to taking a shower.  Everybody knows that you can slip, hit your head and get killed.  Does that mean that you should not shower again?  Nevertheless always keep in mind that statistics do not mean anything if you happen to be on the wrong side of them.  Even if a procedure has a 1 (one) in a 1,000,000 (million) chance of going wrong, it you happen to be that one..Also, keep in mind that by statistics, you have more of a chance of having something go wrong when taking medications.  Who should not have this procedure? If you  are on a blood thinning medication (e.g. Coumadin, Plavix, see list of "Blood Thinners"), or if you have an active infection going on, you should not have the procedure.  If you are taking any blood thinners, please inform your physician.  How should I prepare for this procedure?  Do not eat or drink anything at least six hours prior to the procedure.  Bring a driver with you .  It cannot be a taxi.  Come accompanied by an adult that can drive you back, and that is strong enough to help you if your legs get weak or numb from the local anesthetic.  Take all of your medicines the morning of the procedure with just enough water to swallow them.  If you have diabetes, make sure that you are scheduled to have your procedure done first thing in the morning, whenever possible.  If you have diabetes, take only half of your insulin dose and notify our nurse that you have done so as soon as you arrive at the clinic.  If you are diabetic, but only take blood sugar pills (oral hypoglycemic), then do not take them on the morning of your procedure.  You may take them after you  have had the procedure.  Do not take aspirin or any aspirin-containing medications, at least eleven (11) days prior to the procedure.  They may prolong bleeding.  Wear loose fitting clothing that may be easy to take off and that you would not mind if it got stained with Betadine or blood.  Do not wear any jewelry or perfume  Remove any nail coloring.  It will interfere with some of our monitoring equipment.  NOTE: Remember that this is not meant to be interpreted as a complete list of all possible complications.  Unforeseen problems may occur.  BLOOD THINNERS The following drugs contain aspirin or other products, which can cause increased bleeding during surgery and should not be taken for 2 weeks prior to and 1 week after surgery.  If you should need take something for relief of minor pain, you may take acetaminophen which is found in Tylenol,m Datril, Anacin-3 and Panadol. It is not blood thinner. The products listed below are.  Do not take any of the products listed below in addition to any listed on your instruction sheet.  A.P.C or A.P.C with Codeine Codeine Phosphate Capsules #3 Ibuprofen Ridaura  ABC compound Congesprin Imuran rimadil  Advil Cope Indocin Robaxisal  Alka-Seltzer Effervescent Pain Reliever and Antacid Coricidin or Coricidin-D  Indomethacin Rufen  Alka-Seltzer plus Cold Medicine Cosprin Ketoprofen S-A-C Tablets  Anacin Analgesic Tablets or Capsules Coumadin Korlgesic Salflex  Anacin Extra Strength Analgesic tablets or capsules CP-2 Tablets Lanoril Salicylate  Anaprox Cuprimine Capsules Levenox Salocol  Anexsia-D Dalteparin Magan Salsalate  Anodynos Darvon compound Magnesium Salicylate Sine-off  Ansaid Dasin Capsules Magsal Sodium Salicylate  Anturane Depen Capsules Marnal Soma  APF Arthritis pain formula Dewitt's Pills Measurin Stanback  Argesic Dia-Gesic Meclofenamic Sulfinpyrazone  Arthritis Bayer Timed Release Aspirin Diclofenac Meclomen Sulindac  Arthritis pain  formula Anacin Dicumarol Medipren Supac  Analgesic (Safety coated) Arthralgen Diffunasal Mefanamic Suprofen  Arthritis Strength Bufferin Dihydrocodeine Mepro Compound Suprol  Arthropan liquid Dopirydamole Methcarbomol with Aspirin Synalgos  ASA tablets/Enseals Disalcid Micrainin Tagament  Ascriptin Doan's Midol Talwin  Ascriptin A/D Dolene Mobidin Tanderil  Ascriptin Extra Strength Dolobid Moblgesic Ticlid  Ascriptin with Codeine Doloprin or Doloprin with Codeine Momentum Tolectin  Asperbuf Duoprin Mono-gesic Trendar  Aspergum Duradyne Motrin or Motrin IB Triminicin  Aspirin plain,  buffered or enteric coated Durasal Myochrisine Trigesic  Aspirin Suppositories Easprin Nalfon Trillsate  Aspirin with Codeine Ecotrin Regular or Extra Strength Naprosyn Uracel  Atromid-S Efficin Naproxen Ursinus  Auranofin Capsules Elmiron Neocylate Vanquish  Axotal Emagrin Norgesic Verin  Azathioprine Empirin or Empirin with Codeine Normiflo Vitamin E  Azolid Emprazil Nuprin Voltaren  Bayer Aspirin plain, buffered or children's or timed BC Tablets or powders Encaprin Orgaran Warfarin Sodium  Buff-a-Comp Enoxaparin Orudis Zorpin  Buff-a-Comp with Codeine Equegesic Os-Cal-Gesic   Buffaprin Excedrin plain, buffered or Extra Strength Oxalid   Bufferin Arthritis Strength Feldene Oxphenbutazone   Bufferin plain or Extra Strength Feldene Capsules Oxycodone with Aspirin   Bufferin with Codeine Fenoprofen Fenoprofen Pabalate or Pabalate-SF   Buffets II Flogesic Panagesic   Buffinol plain or Extra Strength Florinal or Florinal with Codeine Panwarfarin   Buf-Tabs Flurbiprofen Penicillamine   Butalbital Compound Four-way cold tablets Penicillin   Butazolidin Fragmin Pepto-Bismol   Carbenicillin Geminisyn Percodan   Carna Arthritis Reliever Geopen Persantine   Carprofen Gold's salt Persistin   Chloramphenicol Goody's Phenylbutazone   Chloromycetin Haltrain Piroxlcam   Clmetidine heparin Plaquenil   Cllnoril  Hyco-pap Ponstel   Clofibrate Hydroxy chloroquine Propoxyphen         Before stopping any of these medications, be sure to consult the physician who ordered them.  Some, such as Coumadin (Warfarin) are ordered to prevent or treat serious conditions such as "deep thrombosis", "pumonary embolisms", and other heart problems.  The amount of time that you may need off of the medication may also vary with the medication and the reason for which you were taking it.  If you are taking any of these medications, please make sure you notify your pain physician before you undergo any procedures.

## 2015-07-05 NOTE — Progress Notes (Signed)
Safety precautions to be maintained throughout the outpatient stay will include: orient to surroundings, keep bed in low position, maintain call bell within reach at all times, provide assistance with transfer out of bed and ambulation.  

## 2015-07-06 ENCOUNTER — Telehealth: Payer: Self-pay | Admitting: *Deleted

## 2015-07-06 NOTE — Telephone Encounter (Signed)
Left voicemail for patient to call our office if there are questions or concerns re; procedure on yesterday.  

## 2015-07-07 ENCOUNTER — Other Ambulatory Visit: Payer: Self-pay | Admitting: Internal Medicine

## 2015-07-11 ENCOUNTER — Other Ambulatory Visit: Payer: Self-pay | Admitting: Pain Medicine

## 2015-07-11 DIAGNOSIS — M5010 Cervical disc disorder with radiculopathy, unspecified cervical region: Secondary | ICD-10-CM

## 2015-07-11 DIAGNOSIS — M51369 Other intervertebral disc degeneration, lumbar region without mention of lumbar back pain or lower extremity pain: Secondary | ICD-10-CM

## 2015-07-11 DIAGNOSIS — M47816 Spondylosis without myelopathy or radiculopathy, lumbar region: Secondary | ICD-10-CM

## 2015-07-11 DIAGNOSIS — M47817 Spondylosis without myelopathy or radiculopathy, lumbosacral region: Secondary | ICD-10-CM

## 2015-07-11 DIAGNOSIS — M961 Postlaminectomy syndrome, not elsewhere classified: Secondary | ICD-10-CM

## 2015-07-11 DIAGNOSIS — M461 Sacroiliitis, not elsewhere classified: Secondary | ICD-10-CM

## 2015-07-11 DIAGNOSIS — M47812 Spondylosis without myelopathy or radiculopathy, cervical region: Secondary | ICD-10-CM

## 2015-07-11 DIAGNOSIS — M5030 Other cervical disc degeneration, unspecified cervical region: Secondary | ICD-10-CM

## 2015-07-11 DIAGNOSIS — M19011 Primary osteoarthritis, right shoulder: Secondary | ICD-10-CM

## 2015-07-11 DIAGNOSIS — M19012 Primary osteoarthritis, left shoulder: Secondary | ICD-10-CM

## 2015-07-11 DIAGNOSIS — M5136 Other intervertebral disc degeneration, lumbar region: Secondary | ICD-10-CM

## 2015-07-11 DIAGNOSIS — M47818 Spondylosis without myelopathy or radiculopathy, sacral and sacrococcygeal region: Secondary | ICD-10-CM

## 2015-07-11 DIAGNOSIS — M533 Sacrococcygeal disorders, not elsewhere classified: Secondary | ICD-10-CM

## 2015-07-14 ENCOUNTER — Other Ambulatory Visit: Payer: Self-pay | Admitting: Pain Medicine

## 2015-07-18 ENCOUNTER — Encounter: Payer: Self-pay | Admitting: Pain Medicine

## 2015-07-18 ENCOUNTER — Ambulatory Visit: Payer: BLUE CROSS/BLUE SHIELD | Attending: Pain Medicine | Admitting: Pain Medicine

## 2015-07-18 VITALS — BP 146/87 | HR 70 | Temp 97.7°F | Resp 18 | Ht 68.0 in | Wt 180.0 lb

## 2015-07-18 DIAGNOSIS — Z9889 Other specified postprocedural states: Secondary | ICD-10-CM | POA: Diagnosis not present

## 2015-07-18 DIAGNOSIS — M5010 Cervical disc disorder with radiculopathy, unspecified cervical region: Secondary | ICD-10-CM | POA: Insufficient documentation

## 2015-07-18 DIAGNOSIS — M542 Cervicalgia: Secondary | ICD-10-CM | POA: Diagnosis present

## 2015-07-18 DIAGNOSIS — G5600 Carpal tunnel syndrome, unspecified upper limb: Secondary | ICD-10-CM | POA: Insufficient documentation

## 2015-07-18 DIAGNOSIS — M47816 Spondylosis without myelopathy or radiculopathy, lumbar region: Secondary | ICD-10-CM

## 2015-07-18 DIAGNOSIS — M62830 Muscle spasm of back: Secondary | ICD-10-CM | POA: Diagnosis not present

## 2015-07-18 DIAGNOSIS — M5136 Other intervertebral disc degeneration, lumbar region: Secondary | ICD-10-CM | POA: Diagnosis not present

## 2015-07-18 DIAGNOSIS — M19011 Primary osteoarthritis, right shoulder: Secondary | ICD-10-CM | POA: Insufficient documentation

## 2015-07-18 DIAGNOSIS — M533 Sacrococcygeal disorders, not elsewhere classified: Secondary | ICD-10-CM | POA: Insufficient documentation

## 2015-07-18 DIAGNOSIS — M19012 Primary osteoarthritis, left shoulder: Secondary | ICD-10-CM | POA: Insufficient documentation

## 2015-07-18 DIAGNOSIS — G5603 Carpal tunnel syndrome, bilateral upper limbs: Secondary | ICD-10-CM

## 2015-07-18 DIAGNOSIS — M47817 Spondylosis without myelopathy or radiculopathy, lumbosacral region: Secondary | ICD-10-CM

## 2015-07-18 DIAGNOSIS — M545 Low back pain: Secondary | ICD-10-CM | POA: Diagnosis present

## 2015-07-18 DIAGNOSIS — M961 Postlaminectomy syndrome, not elsewhere classified: Secondary | ICD-10-CM

## 2015-07-18 DIAGNOSIS — Z981 Arthrodesis status: Secondary | ICD-10-CM | POA: Insufficient documentation

## 2015-07-18 DIAGNOSIS — M461 Sacroiliitis, not elsewhere classified: Secondary | ICD-10-CM

## 2015-07-18 DIAGNOSIS — M47818 Spondylosis without myelopathy or radiculopathy, sacral and sacrococcygeal region: Secondary | ICD-10-CM

## 2015-07-18 MED ORDER — GABAPENTIN 300 MG PO CAPS: ORAL_CAPSULE | ORAL | Status: DC

## 2015-07-18 MED ORDER — OXYCODONE-ACETAMINOPHEN 7.5-325 MG PO TABS: ORAL_TABLET | ORAL | Status: DC

## 2015-07-18 NOTE — Patient Instructions (Addendum)
PLAN   Continue present medications Mobic and Percocet  CAUTION Mobic can cause damage to kidney and liver and stomach as discusse. The patient will continue Neurontin if tolerated without undesirable side effects and will take Mobic with caution as mentioned  Radiofrequency rhizolysis lumbar facets to be performed at time return appointment  F/U PCP R Baity for evaliation of  BP and general medical condition  F/U surgical evaluation. Patient will follow-up with Dr. Tamera Punt for evaluation of shoulder . Patient will follow-up with Dr. Samule Dry for further evaluation of pain of neck and upper extremities as discussed  F/U neurological evaluation as discussed.  PNCV/EMG studies as plannedTo evaluate for carpal tone syndrome, cervical radiculopathy, and other abnormalities  May consider radiofrequency rhizolysis or intraspinal procedures pending response to present treatment and F/U evaluation . As discussed we are waiting for insurance approval  Patient to call Pain Management Center should patient have concerns prior to scheduled return appointment.Radiofrequency Lesioning Radiofrequency lesioning is a procedure that is performed to relieve pain. The procedure is often used for back, neck, or arm pain. Radiofrequency lesioning involves the use of a machine that creates radio waves to make heat. During the procedure, the heat is applied to the nerve that carries the pain signal. The heat damages the nerve and interferes with the pain signal. Pain relief usually lasts for 6 months to 1 year. LET [facility] CARE PROVIDER KNOW ABOUT:  Any allergies you have.  All medicines you are taking, including vitamins, herbs, eye drops, creams, and over-the-counter medicines.  Previous problems you or members of your family have had with the use of anesthetics.  Any blood disorders you have.  Previous surgeries you have had.  Any medical conditions you have.  Whether you are pregnant or may be  pregnant. RISKS AND COMPLICATIONS Generally, this is a safe procedure. However, problems may occur, including:  Pain or soreness at the injection site.  Infection at the injection site.  Damage to nerves or blood vessels. BEFORE THE PROCEDURE  Ask your health care provider about:  Changing or stopping your regular medicines. This is especially important if you are taking diabetes medicines or blood thinners.  Taking medicines such as aspirin and ibuprofen. These medicines can thin your blood. Do not take these medicines before your procedure if your health care provider instructs you not to.  Follow instructions from your health care provider about eating or drinking restrictions.  Plan to have someone take you home after the procedure.  If you go home right after the procedure, plan to have someone with you for 24 hours. PROCEDURE  You will be given one or more of the following:  A medicine to help you relax (sedative).  A medicine to numb the area (local anesthetic).  You will be awake during the procedure. You will need to be able to talk with the health care provider during the procedure.  With the help of a type of X-ray (fluoroscopy), the health care provider will insert a radiofrequency needle into the area to be treated.  Next, a wire that carries the radio waves (electrode) will be put through the radiofrequency needle. An electrical pulse will be sent through the electrode to verify the correct nerve. You will feel a tingling sensation, and you may have muscle twitching.  Then, the tissue that is around the needle tip will be heated by an electric current that is passed using the radiofrequency machine. This will numb the nerves.  A bandage (dressing)  will be put on the insertion area after the procedure is done. The procedure may vary among health care providers and hospitals. AFTER THE PROCEDURE  Your blood pressure, heart rate, breathing rate, and blood oxygen  level will be monitored often until the medicines you were given have worn off.  Return to your normal activities as directed by your health care provider.   This information is not intended to replace advice given to you by your health care provider. Make sure you discuss any questions you have with your health care provider.   Document Released: 09/26/2010 Document Revised: 10/19/2014 Document Reviewed: 03/07/2014 Elsevier Interactive Patient Education [employer].

## 2015-07-18 NOTE — Progress Notes (Signed)
Safety precautions to be maintained throughout the outpatient stay will include: orient to surroundings, keep bed in low position, maintain call bell within reach at all times, provide assistance with transfer out of bed and ambulation.  

## 2015-07-18 NOTE — Progress Notes (Signed)
Subjective:    Patient ID: Derrick Kelley, male    DOB: 07-23-1960, 55 y.o.   MRN: [id number]  HPI   The patient is a 55 year old gentleman who returns to pain management for further evaluation and treatment of pain involving the neck upper extremity regions as well as the lower back and lower extremity region. The patient is undergone cervical MRI and has discussed findings with Dr.Durmonski there is concern regarding intraspinal abnormalities of the cervical region contributing to patient's symptoms involving the upper extremity regions. The patient is scheduled to undergo PNCV/EMG studies since there is concern regarding carpal tunnel syndrome which may be contributing to patient's symptoms compared to cervical radiculopathy. At the present time we will remain available to consider patient for interventional treatment involving the neck and upper extremity regions and lumbar and lower extremity regions. The patient is status post lumbar facet, medial branch nerve, blocks with improvement of his lower back and lower extremity pain. We have requested radiofrequency rhizolysis of the lumbar facet, medial branch nerves be approved and will proceed with such seizure if patient has been approved for procedure. We will continue medications consisting of Mobic Percocet and Neurontin. The patient will continue Neurontin if tolerated without undesirable side effects . The patient has been cautioned regarding the side effects of  Mobic as well. We will consider patient for radiofrequency rhizolysis lumbar facet medial branch nerve to be performed at time return appointment pending insurance approval      Review of Systems     Objective:   Physical Exam     There was tenderness of the splenius capitis and occipitalis region palpation which be produced moderate discomfort with moderate tenderness of the cervical facet cervical spinal musculature region. There was no definite abnormality noted with  Spurling's maneuver. There was questionably decreased sensation of the upper extremities with Tinel and Phalen's maneuver reproducing mild discomfort. Palpation over the thoracic facet thoracic paraspinal muscular region was attends to palpation without crepitus of the thoracic region noted. There was evidence of moderate muscle spasm involving the thoracic paraspinal musculature region as well. Palpation of the acromioclavicular and glenohumeral joint regions reproduce moderate discomfort with limited range of motion of the shoulder noted. There was tenderness over the thoracic region without crepitus of the thoracic region noted. Palpation over the lumbar paraspinal musculatures and lumbar facet region was attends to palpation of moderate degree with lateral bending rotation extension and palpation of the lumbar facets reproducing moderate discomfort. Straight leg raise was tolerates approximately 20 without increased pain with dorsiflexion noted. DTRs appeared to be trace at the knees. There was negative clonus negative Homans without sensory deficit or dermatomal distribution detected. There was no costovertebral tenderness noted the predominant lower back lower extremity pain was reproduced with palpation over the lumbar facet region.       Assessment & Plan:     Degenerative disc disease lumbar spine Status post L4-L5 posterior lumbar interbody fusion and decompression of the central canal with solid fusion L5-S1, L3-4 degenerative disc disease, moderate facet arthrosis.  Lumbar facet syndrome  Sacroiliac joint dysfunction  Degenerative joint disease of shoulders Status post surgery of shoulder  Degenerative disc disease of the cervical spine  Cervical radiculopathy  Cervical facet syndrome  Carpal tunnel syndrome     PLAN   Continue present medications Mobic and Percocet  CAUTION Mobic can cause damage to kidney and liver and stomach as discusse. The patient will continue  Neurontin if tolerated without undesirable side  effects and will take Mobic with caution as mentioned  Radiofrequency rhizolysis lumbar facets to be performed at time return appointment  F/U PCP R Baity for evaliation of  BP and general medical condition  F/U surgical evaluation. Patient will follow-up with Dr. Tamera Punt for evaluation of shoulder . Patient will follow-up with Dr. Samule Dry for further evaluation of pain of neck and upper extremities as discussed  F/U neurological evaluation as discussed.  PNCV/EMG studies as plannedTo evaluate for carpal tone syndrome, cervical radiculopathy, and other abnormalities  May consider radiofrequency rhizolysis or intraspinal procedures pending response to present treatment and F/U evaluation . As discussed we are waiting for insurance approval  Patient to call Pain Management Center should patient have concerns prior to scheduled return appointment.

## 2015-07-24 ENCOUNTER — Other Ambulatory Visit: Payer: Self-pay | Admitting: Internal Medicine

## 2015-07-24 NOTE — Telephone Encounter (Signed)
Last filled 06/20/15--please advise

## 2015-07-24 NOTE — Telephone Encounter (Signed)
Ok to phone in Ambien 

## 2015-07-25 ENCOUNTER — Encounter: Payer: Self-pay | Admitting: Internal Medicine

## 2015-07-25 ENCOUNTER — Ambulatory Visit (INDEPENDENT_AMBULATORY_CARE_PROVIDER_SITE_OTHER): Payer: BLUE CROSS/BLUE SHIELD | Admitting: Internal Medicine

## 2015-07-25 VITALS — BP 134/84 | HR 69 | Temp 98.1°F | Wt 187.0 lb

## 2015-07-25 DIAGNOSIS — G4700 Insomnia, unspecified: Secondary | ICD-10-CM | POA: Diagnosis not present

## 2015-07-25 MED ORDER — SUVOREXANT 10 MG PO TABS: 1.0000 | ORAL_TABLET | Freq: Every day | ORAL | Status: DC

## 2015-07-25 NOTE — Telephone Encounter (Signed)
Rx called in to pharmacy. 

## 2015-07-25 NOTE — Progress Notes (Signed)
Pre visit review using our clinic review tool, if applicable. No additional management support is needed unless otherwise documented below in the visit note. 

## 2015-07-25 NOTE — Patient Instructions (Signed)
Insomnia Insomnia is a sleep disorder that makes it difficult to fall asleep or to stay asleep. Insomnia can cause tiredness (fatigue), low energy, difficulty concentrating, mood swings, and poor performance at work or school.  There are three different ways to classify insomnia:  Difficulty falling asleep.  Difficulty staying asleep.  Waking up too early in the morning. Any type of insomnia can be long-term (chronic) or short-term (acute). Both are common. Short-term insomnia usually lasts for three months or less. Chronic insomnia occurs at least three times a week for longer than three months. CAUSES  Insomnia may be caused by another condition, situation, or substance, such as:  Anxiety.  Certain medicines.  Gastroesophageal reflux disease (GERD) or other gastrointestinal conditions.  Asthma or other breathing conditions.  Restless legs syndrome, sleep apnea, or other sleep disorders.  Chronic pain.  Menopause. This may include hot flashes.  Stroke.  Abuse of alcohol, tobacco, or illegal drugs.  Depression.  Caffeine.   Neurological disorders, such as Alzheimer disease.  An overactive thyroid (hyperthyroidism). The cause of insomnia may not be known. RISK FACTORS Risk factors for insomnia include:  Gender. Women are more commonly affected than men.  Age. Insomnia is more common as you get older.  Stress. This may involve your professional or personal life.  Income. Insomnia is more common in people with lower income.  Lack of exercise.   Irregular work schedule or night shifts.  Traveling between different time zones. SIGNS AND SYMPTOMS If you have insomnia, trouble falling asleep or trouble staying asleep is the main symptom. This may lead to other symptoms, such as:  Feeling fatigued.  Feeling nervous about going to sleep.  Not feeling rested in the morning.  Having trouble concentrating.  Feeling irritable, anxious, or depressed. TREATMENT   Treatment for insomnia depends on the cause. If your insomnia is caused by an underlying condition, treatment will focus on addressing the condition. Treatment may also include:   Medicines to help you sleep.  Counseling or therapy.  Lifestyle adjustments. HOME CARE INSTRUCTIONS   Take medicines only as directed by your health care provider.  Keep regular sleeping and waking hours. Avoid naps.  Keep a sleep diary to help you and your health care provider figure out what could be causing your insomnia. Include:   When you sleep.  When you wake up during the night.  How well you sleep.   How rested you feel the next day.  Any side effects of medicines you are taking.  What you eat and drink.   Make your bedroom a comfortable place where it is easy to fall asleep:  Put up shades or special blackout curtains to block light from outside.  Use a white noise machine to block noise.  Keep the temperature cool.   Exercise regularly as directed by your health care provider. Avoid exercising right before bedtime.  Use relaxation techniques to manage stress. Ask your health care provider to suggest some techniques that may work well for you. These may include:  Breathing exercises.  Routines to release muscle tension.  Visualizing peaceful scenes.  Cut back on alcohol, caffeinated beverages, and cigarettes, especially close to bedtime. These can disrupt your sleep.  Do not overeat or eat spicy foods right before bedtime. This can lead to digestive discomfort that can make it hard for you to sleep.  Limit screen use before bedtime. This includes:  Watching TV.  Using your smartphone, tablet, and computer.  Stick to a routine. This   can help you fall asleep faster. Try to do a quiet activity, brush your teeth, and go to bed at the same time each night.  Get out of bed if you are still awake after 15 minutes of trying to sleep. Keep the lights down, but try reading or  doing a quiet activity. When you feel sleepy, go back to bed.  Make sure that you drive carefully. Avoid driving if you feel very sleepy.  Keep all follow-up appointments as directed by your health care provider. This is important. SEEK MEDICAL CARE IF:   You are tired throughout the day or have trouble in your daily routine due to sleepiness.  You continue to have sleep problems or your sleep problems get worse. SEEK IMMEDIATE MEDICAL CARE IF:   You have serious thoughts about hurting yourself or someone else.   This information is not intended to replace advice given to you by your health care provider. Make sure you discuss any questions you have with your health care provider.   Document Released: 01/26/2000 Document Revised: 10/19/2014 Document Reviewed: 10/29/2013 Elsevier Interactive Patient Education 2016 [employer]  

## 2015-07-25 NOTE — Progress Notes (Signed)
Subjective:    Patient ID: Derrick Kelley, male    DOB: [birth year], 55 y.o.   MRN: [id number]  HPI  Pt presents to the clinic today to discuss insomnia. He reports if he takes 1 tab of the Ambien as prescribed, he only sleeps 3-4 hours. If he doubles up the dose, he will get a full 8 hours, but he runs out of his medication early. He has tried and failed Lunesta, Trazadone and Temazepam. He has also tried other things like Tylenol PM, Benadryl and muscle relaxers without relief. He has heard about Belsomra and would like to try alternating that with Ambien. He does have OSA and wears a CPAP machine but reports it is not helpful. Review of Systems      Past Medical History  Diagnosis Date  . BACK PAIN, CHRONIC 05/11/2009    bone on bone;hx of herniated disc  . Flushing 05/11/2009  . HYPERLIPIDEMIA     takes Lipitor daily  . HYPOGONADISM 05/11/2009    takes Testosterone every 14days  . INSOMNIA-SLEEP DISORDER-UNSPEC 10/10/2009    takes Ambien nightly  . POLYCYTHEMIA 05/11/2009  . SCIATICA, RIGHT 09/20/2009  . SINUSITIS- ACUTE-NOS 10/10/2009  . URI 05/11/2009  . Arthritis   . Chronic back pain     hx buldging disc;  . Hemorrhoids     hx of  . HYPERTENSION     takes Avapro,Amlodipine,and Lisinopril daily  . Headache(784.0) 10/10/2009    cluster;last one about a yr ago  . Joint pain   . Joint pain   . GERD 05/11/2009    but doesn't require meds  . DEPRESSION     takes Zoloft daily  . Sleep apnea     use CPAP  . Hypercholesteremia     Current Outpatient Prescriptions  Medication Sig Dispense Refill  . amLODipine (NORVASC) 10 MG tablet TAKE 1 TABLET BY MOUTH DAILY 30 tablet 2  . atorvastatin (LIPITOR) 20 MG tablet TAKE 1 TABLET BY MOUTH DAILY AT 6PM 30 tablet 2  . celecoxib (CELEBREX) 100 MG capsule Take 100 mg by mouth daily. Reported on 07/18/2015    . gabapentin (NEURONTIN) 300 MG capsule Limit 1 capsule by mouth per day or 2 - 3  times per day if tolerated 90 capsule 0  .  irbesartan (AVAPRO) 150 MG tablet TAKE 1 TABLET BY MOUTH DAILY 30 tablet 4  . oxycodone (OXY-IR) 5 MG capsule Limit  1 to 3 tablets by mouth daily if tolerated 90 capsule 0  . oxyCODONE-acetaminophen (PERCOCET) 7.5-325 MG tablet Limit 1 tab by mouth 2 - 4  times per day if tolerated 110 tablet 0  . pantoprazole (PROTONIX) 40 MG tablet Take 40 mg by mouth daily.    . sertraline (ZOLOFT) 100 MG tablet Take 2 tablets (200 mg total) by mouth daily. 60 tablet 4  . testosterone cypionate (DEPOTESTOTERONE CYPIONATE) 200 MG/ML injection Inject 1 mL (200 mg total) into the muscle every 14 (fourteen) days. 1 cc biweekly IM (self Injected)  Last injection was on 04/12/11 10 mL 5  . tiZANidine (ZANAFLEX) 4 MG tablet Limited 1 tablet by mouth 2-4 times per day if tolerated 120 tablet 2  . zolpidem (AMBIEN CR) 12.5 MG CR tablet TAKE ONE TABLET BY MOUTH EVERY NIGHT AT BEDTIME 30 tablet 0  . Suvorexant (BELSOMRA) 10 MG TABS Take 1 tablet by mouth at bedtime. 30 tablet 0   Current Facility-Administered Medications  Medication Dose Route Frequency Provider Last Rate Last Dose  .  bupivacaine (PF) (MARCAINE) 0.25 % injection 30 mL  30 mL Other Once Mohammed Kindle, MD      . ceFAZolin (ANCEF) IVPB 1 g/50 mL premix  1 g Intravenous Once Mohammed Kindle, MD      . fentaNYL (SUBLIMAZE) injection 100 mcg  100 mcg Intravenous Once Mohammed Kindle, MD      . lactated ringers infusion 1,000 mL  1,000 mL Intravenous Continuous Mohammed Kindle, MD      . lactated ringers infusion 1,000 mL  1,000 mL Intravenous Continuous Mohammed Kindle, MD      . midazolam (VERSED) 5 MG/5ML injection 5 mg  5 mg Intravenous Once Mohammed Kindle, MD      . orphenadrine (NORFLEX) injection 60 mg  60 mg Intramuscular Once Mohammed Kindle, MD      . orphenadrine (NORFLEX) injection 60 mg  60 mg Intramuscular Once Mohammed Kindle, MD      . triamcinolone acetonide (KENALOG-40) injection 40 mg  40 mg Other Once Mohammed Kindle, MD        Allergies  Allergen  Reactions  . Bupropion Hcl     REACTION: nightmares  . Codeine Hives  . Lunesta [Eszopiclone] Other (See Comments)    Worsened depression    Family History  Problem Relation Age of Onset  . Alcohol abuse Father   . Cancer Father     prostate  . Alcohol abuse Maternal Aunt   . Alcohol abuse Maternal Uncle   . Cancer Paternal Uncle     prostate  . Cancer Maternal Grandmother     ovarian cancer  . Alcohol abuse Other   . Anesthesia problems Neg Hx   . Hypotension Neg Hx   . Malignant hyperthermia Neg Hx   . Pseudochol deficiency Neg Hx     Social History   Social History  . Marital Status: Married    Spouse Name: N/A  . Number of Children: 2  . Years of Education: N/A   Occupational History  . unemployed    Social History Main Topics  . Smoking status: Never Smoker   . Smokeless tobacco: Never Used  . Alcohol Use: 3.0 oz/week    5 Standard drinks or equivalent per week     Comment: occasional  . Drug Use: No  . Sexual Activity: Yes   Other Topics Concern  . Not on file   Social History Narrative     Constitutional: Denies fever, malaise, fatigue, headache or abrupt weight changes.  Neurological: Pt reports insomnia. Denies dizziness, difficulty with memory, difficulty with speech or problems with balance and coordination.    No other specific complaints in a complete review of systems (except as listed in HPI above).  Objective:   Physical Exam  BP 134/84 mmHg  Pulse 69  Temp(Src) 98.1 F (36.7 C) (Oral)  Wt 187 lb (84.823 kg)  SpO2 97% Wt Readings from Last 3 Encounters:  07/25/15 187 lb (84.823 kg)  07/18/15 180 lb (81.647 kg)  07/05/15 180 lb (81.647 kg)    General: Appears his stated age, well developed, well nourished in NAD. Neurological: Alert and oriented.    BMET    Component Value Date/Time   NA 138 09/06/2014 1146   K 4.3 09/06/2014 1146   CL 103 09/06/2014 1146   CO2 30 09/06/2014 1146   GLUCOSE 80 09/06/2014 1146   BUN 19  09/06/2014 1146   CREATININE 1.45 09/06/2014 1146   CALCIUM 9.6 09/06/2014 1146   GFRNONAA 78* 01/19/2014 1417  GFRAA >90 01/19/2014 1417    Lipid Panel     Component Value Date/Time   CHOL 143 09/15/2014 0901   TRIG 161.0* 09/15/2014 0901   HDL 36.30* 09/15/2014 0901   CHOLHDL 4 09/15/2014 0901   VLDL 32.2 09/15/2014 0901   LDLCALC 75 09/15/2014 0901    CBC    Component Value Date/Time   WBC 7.4 09/06/2014 1146   WBC 7.4 01/02/2010 1416   RBC 5.52 09/06/2014 1146   RBC 4.86 01/02/2010 1416   HGB 17.2* 09/06/2014 1146   HGB Unable to Determine 01/02/2010 1416   HCT 50.1 09/06/2014 1146   HCT 41.2 01/02/2010 1416   PLT 190.0 09/06/2014 1146   PLT 208 01/02/2010 1416   MCV 90.7 09/06/2014 1146   MCV 84.8 01/02/2010 1416   MCH 32.8 01/28/2014 0500   MCH Unable to determine 01/02/2010 1416   MCHC 34.3 09/06/2014 1146   MCHC Unable to determine 01/02/2010 1416   RDW 12.9 09/06/2014 1146   RDW 12.9 01/02/2010 1416   LYMPHSABS 0.9 01/19/2014 1417   LYMPHSABS 1.6 01/02/2010 1416   MONOABS 0.7 01/19/2014 1417   MONOABS 0.4 01/02/2010 1416   EOSABS 0.1 01/19/2014 1417   EOSABS 0.1 01/02/2010 1416   BASOSABS 0.0 01/19/2014 1417   BASOSABS 0.0 01/02/2010 1416    Hgb A1C No results found for: HGBA1C       Assessment & Plan:   Insomnia:  Will try alternating Belsomra and Ambien nightly eRx for Belsomra 10 mg QHS Consider referral to neurology at [facility] for sleep disorder evaluation  RTC in 2 months for annual exam  Webb Silversmith, NP

## 2015-08-02 ENCOUNTER — Other Ambulatory Visit: Payer: Self-pay | Admitting: Pain Medicine

## 2015-08-02 DIAGNOSIS — M961 Postlaminectomy syndrome, not elsewhere classified: Secondary | ICD-10-CM

## 2015-08-02 DIAGNOSIS — M533 Sacrococcygeal disorders, not elsewhere classified: Secondary | ICD-10-CM

## 2015-08-02 DIAGNOSIS — G5603 Carpal tunnel syndrome, bilateral upper limbs: Secondary | ICD-10-CM

## 2015-08-02 DIAGNOSIS — M5136 Other intervertebral disc degeneration, lumbar region: Secondary | ICD-10-CM

## 2015-08-02 DIAGNOSIS — M47818 Spondylosis without myelopathy or radiculopathy, sacral and sacrococcygeal region: Secondary | ICD-10-CM

## 2015-08-02 DIAGNOSIS — M19011 Primary osteoarthritis, right shoulder: Secondary | ICD-10-CM

## 2015-08-02 DIAGNOSIS — M461 Sacroiliitis, not elsewhere classified: Secondary | ICD-10-CM

## 2015-08-02 DIAGNOSIS — M19012 Primary osteoarthritis, left shoulder: Secondary | ICD-10-CM

## 2015-08-02 DIAGNOSIS — M47817 Spondylosis without myelopathy or radiculopathy, lumbosacral region: Secondary | ICD-10-CM

## 2015-08-02 DIAGNOSIS — M5010 Cervical disc disorder with radiculopathy, unspecified cervical region: Secondary | ICD-10-CM

## 2015-08-02 DIAGNOSIS — M47816 Spondylosis without myelopathy or radiculopathy, lumbar region: Secondary | ICD-10-CM

## 2015-08-02 DIAGNOSIS — M51369 Other intervertebral disc degeneration, lumbar region without mention of lumbar back pain or lower extremity pain: Secondary | ICD-10-CM

## 2015-08-03 ENCOUNTER — Ambulatory Visit (INDEPENDENT_AMBULATORY_CARE_PROVIDER_SITE_OTHER): Payer: BLUE CROSS/BLUE SHIELD | Admitting: Internal Medicine

## 2015-08-03 ENCOUNTER — Encounter: Payer: Self-pay | Admitting: Internal Medicine

## 2015-08-03 ENCOUNTER — Telehealth: Payer: Self-pay

## 2015-08-03 VITALS — BP 124/76 | HR 68 | Temp 97.7°F | Wt 186.0 lb

## 2015-08-03 DIAGNOSIS — L740 Miliaria rubra: Secondary | ICD-10-CM

## 2015-08-03 DIAGNOSIS — L739 Follicular disorder, unspecified: Secondary | ICD-10-CM | POA: Diagnosis not present

## 2015-08-03 NOTE — Progress Notes (Signed)
Subjective:    Patient ID: Derrick Kelley, male    DOB: 05/27/1960, 55 y.o.   MRN: [id number]  HPI  Pt presents to the clinic today with c/o a rash on his chest. He noticed this 1 week ago. He describes the rash as small red bumps, with some white heads. The rash itches and burns. It can be painful to the touch. It seems worse during the day when he is outside in the heat and better at night. He has put [facility] on it with some relief. He has not come in contact with anything that he is allergic to. He denies changes in soaps lotions or detergents.  Review of Systems  Past Medical History  Diagnosis Date  . BACK PAIN, CHRONIC 05/11/2009    bone on bone;hx of herniated disc  . Flushing 05/11/2009  . HYPERLIPIDEMIA     takes Lipitor daily  . HYPOGONADISM 05/11/2009    takes Testosterone every 14days  . INSOMNIA-SLEEP DISORDER-UNSPEC 10/10/2009    takes Ambien nightly  . POLYCYTHEMIA 05/11/2009  . SCIATICA, RIGHT 09/20/2009  . SINUSITIS- ACUTE-NOS 10/10/2009  . URI 05/11/2009  . Arthritis   . Chronic back pain     hx buldging disc;  . Hemorrhoids     hx of  . HYPERTENSION     takes Avapro,Amlodipine,and Lisinopril daily  . Headache(784.0) 10/10/2009    cluster;last one about a yr ago  . Joint pain   . Joint pain   . GERD 05/11/2009    but doesn't require meds  . DEPRESSION     takes Zoloft daily  . Sleep apnea     use CPAP  . Hypercholesteremia     Current Outpatient Prescriptions  Medication Sig Dispense Refill  . amLODipine (NORVASC) 10 MG tablet TAKE 1 TABLET BY MOUTH DAILY 30 tablet 2  . atorvastatin (LIPITOR) 20 MG tablet TAKE 1 TABLET BY MOUTH DAILY AT 6PM 30 tablet 2  . celecoxib (CELEBREX) 100 MG capsule Take 100 mg by mouth daily. Reported on 07/18/2015    . gabapentin (NEURONTIN) 300 MG capsule Limit 1 capsule by mouth per day or 2 - 3  times per day if tolerated 90 capsule 0  . irbesartan (AVAPRO) 150 MG tablet TAKE 1 TABLET BY MOUTH DAILY 30 tablet 4  .  oxycodone (OXY-IR) 5 MG capsule Limit  1 to 3 tablets by mouth daily if tolerated 90 capsule 0  . oxyCODONE-acetaminophen (PERCOCET) 7.5-325 MG tablet Limit 1 tab by mouth 2 - 4  times per day if tolerated 110 tablet 0  . pantoprazole (PROTONIX) 40 MG tablet Take 40 mg by mouth daily.    . sertraline (ZOLOFT) 100 MG tablet Take 2 tablets (200 mg total) by mouth daily. 60 tablet 4  . Suvorexant (BELSOMRA) 10 MG TABS Take 1 tablet by mouth at bedtime. 30 tablet 0  . testosterone cypionate (DEPOTESTOTERONE CYPIONATE) 200 MG/ML injection Inject 1 mL (200 mg total) into the muscle every 14 (fourteen) days. 1 cc biweekly IM (self Injected)  Last injection was on 04/12/11 10 mL 5  . tiZANidine (ZANAFLEX) 4 MG tablet Limited 1 tablet by mouth 2-4 times per day if tolerated 120 tablet 2  . zolpidem (AMBIEN CR) 12.5 MG CR tablet TAKE ONE TABLET BY MOUTH EVERY NIGHT AT BEDTIME 30 tablet 0   Current Facility-Administered Medications  Medication Dose Route Frequency Provider Last Rate Last Dose  . bupivacaine (PF) (MARCAINE) 0.25 % injection 30 mL  30 mL  Other Once Mohammed Kindle, MD      . ceFAZolin (ANCEF) IVPB 1 g/50 mL premix  1 g Intravenous Once Mohammed Kindle, MD      . fentaNYL (SUBLIMAZE) injection 100 mcg  100 mcg Intravenous Once Mohammed Kindle, MD      . lactated ringers infusion 1,000 mL  1,000 mL Intravenous Continuous Mohammed Kindle, MD      . lactated ringers infusion 1,000 mL  1,000 mL Intravenous Continuous Mohammed Kindle, MD      . midazolam (VERSED) 5 MG/5ML injection 5 mg  5 mg Intravenous Once Mohammed Kindle, MD      . orphenadrine (NORFLEX) injection 60 mg  60 mg Intramuscular Once Mohammed Kindle, MD      . orphenadrine (NORFLEX) injection 60 mg  60 mg Intramuscular Once Mohammed Kindle, MD      . triamcinolone acetonide (KENALOG-40) injection 40 mg  40 mg Other Once Mohammed Kindle, MD        Allergies  Allergen Reactions  . Bupropion Hcl     REACTION: nightmares  . Codeine Hives  . Lunesta  [Eszopiclone] Other (See Comments)    Worsened depression    Family History  Problem Relation Age of Onset  . Alcohol abuse Father   . Cancer Father     prostate  . Alcohol abuse Maternal Aunt   . Alcohol abuse Maternal Uncle   . Cancer Paternal Uncle     prostate  . Cancer Maternal Grandmother     ovarian cancer  . Alcohol abuse Other   . Anesthesia problems Neg Hx   . Hypotension Neg Hx   . Malignant hyperthermia Neg Hx   . Pseudochol deficiency Neg Hx     Social History   Social History  . Marital Status: Married    Spouse Name: N/A  . Number of Children: 2  . Years of Education: N/A   Occupational History  . unemployed    Social History Main Topics  . Smoking status: Never Smoker   . Smokeless tobacco: Never Used  . Alcohol Use: 3.0 oz/week    5 Standard drinks or equivalent per week     Comment: occasional  . Drug Use: No  . Sexual Activity: Yes   Other Topics Concern  . Not on file   Social History Narrative     Constitutional: Denies fever, malaise, fatigue, headache or abrupt weight changes.  Skin: Pt reports rash. Denies  ulcercations.    No other specific complaints in a complete review of systems (except as listed in HPI above).     Objective:   Physical Exam   BP 124/76 mmHg  Pulse 68  Temp(Src) 97.7 F (36.5 C) (Oral)  Wt 186 lb (84.369 kg)  SpO2 97% Wt Readings from Last 3 Encounters:  08/03/15 186 lb (84.369 kg)  07/25/15 187 lb (84.823 kg)  07/18/15 180 lb (81.647 kg)    General: Appears his stated age, well developed, well nourished in NAD. Skin: Follicular rash scattered all over upper chest.  BMET    Component Value Date/Time   NA 138 09/06/2014 1146   K 4.3 09/06/2014 1146   CL 103 09/06/2014 1146   CO2 30 09/06/2014 1146   GLUCOSE 80 09/06/2014 1146   BUN 19 09/06/2014 1146   CREATININE 1.45 09/06/2014 1146   CALCIUM 9.6 09/06/2014 1146   GFRNONAA 78* 01/19/2014 1417   GFRAA >90 01/19/2014 1417    Lipid  Panel     Component Value  Date/Time   CHOL 143 09/15/2014 0901   TRIG 161.0* 09/15/2014 0901   HDL 36.30* 09/15/2014 0901   CHOLHDL 4 09/15/2014 0901   VLDL 32.2 09/15/2014 0901   LDLCALC 75 09/15/2014 0901    CBC    Component Value Date/Time   WBC 7.4 09/06/2014 1146   WBC 7.4 01/02/2010 1416   RBC 5.52 09/06/2014 1146   RBC 4.86 01/02/2010 1416   HGB 17.2* 09/06/2014 1146   HGB Unable to Determine 01/02/2010 1416   HCT 50.1 09/06/2014 1146   HCT 41.2 01/02/2010 1416   PLT 190.0 09/06/2014 1146   PLT 208 01/02/2010 1416   MCV 90.7 09/06/2014 1146   MCV 84.8 01/02/2010 1416   MCH 32.8 01/28/2014 0500   MCH Unable to determine 01/02/2010 1416   MCHC 34.3 09/06/2014 1146   MCHC Unable to determine 01/02/2010 1416   RDW 12.9 09/06/2014 1146   RDW 12.9 01/02/2010 1416   LYMPHSABS 0.9 01/19/2014 1417   LYMPHSABS 1.6 01/02/2010 1416   MONOABS 0.7 01/19/2014 1417   MONOABS 0.4 01/02/2010 1416   EOSABS 0.1 01/19/2014 1417   EOSABS 0.1 01/02/2010 1416   BASOSABS 0.0 01/19/2014 1417   BASOSABS 0.0 01/02/2010 1416    Hgb A1C No results found for: HGBA1C      Assessment & Plan:   Folliculitis of chest secondary to heat rash:  Advised him to scrub area with a loofah Wash thoroughly with soap and water Pat dray after bathing Apply witch hazel or neosporin to affected areas If working outside, bring a clean shirt to change in to if other one gets wet from sweat  RTC as needed Webb Silversmith, NP

## 2015-08-03 NOTE — Patient Instructions (Signed)
Folliculitis °Folliculitis is redness, soreness, and swelling (inflammation) of the hair follicles. This condition can occur anywhere on the body. People with weakened immune systems, diabetes, or obesity have a greater risk of getting folliculitis. °CAUSES °· Bacterial infection. This is the most common cause. °· Fungal infection. °· Viral infection. °· Contact with certain chemicals, especially oils and tars. °Long-term folliculitis can result from bacteria that live in the nostrils. The bacteria may trigger multiple outbreaks of folliculitis over time. °SYMPTOMS °Folliculitis most commonly occurs on the scalp, thighs, legs, back, buttocks, and areas where hair is shaved frequently. An early sign of folliculitis is a small, white or yellow, pus-filled, itchy lesion (pustule). These lesions appear on a red, inflamed follicle. They are usually less than 0.2 inches (5 mm) wide. When there is an infection of the follicle that goes deeper, it becomes a boil or furuncle. A group of closely packed boils creates a larger lesion (carbuncle). Carbuncles tend to occur in hairy, sweaty areas of the body. °DIAGNOSIS  °Your caregiver can usually tell what is wrong by doing a physical exam. A sample may be taken from one of the lesions and tested in a lab. This can help determine what is causing your folliculitis. °TREATMENT  °Treatment may include: °· Applying warm compresses to the affected areas. °· Taking antibiotic medicines orally or applying them to the skin. °· Draining the lesions if they contain a large amount of pus or fluid. °· Laser hair removal for cases of long-lasting folliculitis. This helps to prevent regrowth of the hair. °HOME CARE INSTRUCTIONS °· Apply warm compresses to the affected areas as directed by your caregiver. °· If antibiotics are prescribed, take them as directed. Finish them even if you start to feel better. °· You may take over-the-counter medicines to relieve itching. °· Do not shave irritated  skin. °· Follow up with your caregiver as directed. °SEEK IMMEDIATE MEDICAL CARE IF:  °· You have increasing redness, swelling, or pain in the affected area. °· You have a fever. °MAKE SURE YOU: °· Understand these instructions. °· Will watch your condition. °· Will get help right away if you are not doing well or get worse. °  °This information is not intended to replace advice given to you by your health care provider. Make sure you discuss any questions you have with your health care provider. °  °Document Released: 04/08/2001 Document Revised: 02/18/2014 Document Reviewed: 04/30/2011 °Elsevier Interactive Patient Education ©2016 [employer] ° °

## 2015-08-03 NOTE — Telephone Encounter (Signed)
Pt wants to know what is Dr Primus Bravo going to do to help him about his RF and why has he not been getting the procedures that will help him get approval. Pt says Dr Primus Bravo should have been doing more facets instead of other procedures. Pt says he is disappointed

## 2015-08-03 NOTE — Progress Notes (Signed)
Pre visit review using our clinic review tool, if applicable. No additional management support is needed unless otherwise documented below in the visit note. 

## 2015-08-03 NOTE — Telephone Encounter (Signed)
Angie Please explain to patient that not all insurance companies require the same I have been able to do some radiofrequency procedures without performing the number of procedures that his insurance company requires . Also insurance companies change their requirements. Please assure the patient that we have been trying to move as expeditiously as we possibly can and that we will continue to do such  Please let me know if patient needs any further explanation   Thank you very very much

## 2015-08-07 ENCOUNTER — Other Ambulatory Visit: Payer: Self-pay | Admitting: Pain Medicine

## 2015-08-07 ENCOUNTER — Telehealth: Payer: Self-pay

## 2015-08-07 DIAGNOSIS — M51369 Other intervertebral disc degeneration, lumbar region without mention of lumbar back pain or lower extremity pain: Secondary | ICD-10-CM

## 2015-08-07 DIAGNOSIS — M47818 Spondylosis without myelopathy or radiculopathy, sacral and sacrococcygeal region: Secondary | ICD-10-CM

## 2015-08-07 DIAGNOSIS — G5603 Carpal tunnel syndrome, bilateral upper limbs: Secondary | ICD-10-CM

## 2015-08-07 DIAGNOSIS — M5136 Other intervertebral disc degeneration, lumbar region: Secondary | ICD-10-CM

## 2015-08-07 DIAGNOSIS — M961 Postlaminectomy syndrome, not elsewhere classified: Secondary | ICD-10-CM

## 2015-08-07 DIAGNOSIS — M47817 Spondylosis without myelopathy or radiculopathy, lumbosacral region: Secondary | ICD-10-CM

## 2015-08-07 DIAGNOSIS — M533 Sacrococcygeal disorders, not elsewhere classified: Secondary | ICD-10-CM

## 2015-08-07 DIAGNOSIS — M5010 Cervical disc disorder with radiculopathy, unspecified cervical region: Secondary | ICD-10-CM

## 2015-08-07 DIAGNOSIS — M19011 Primary osteoarthritis, right shoulder: Secondary | ICD-10-CM

## 2015-08-07 DIAGNOSIS — M47816 Spondylosis without myelopathy or radiculopathy, lumbar region: Secondary | ICD-10-CM

## 2015-08-07 DIAGNOSIS — M461 Sacroiliitis, not elsewhere classified: Secondary | ICD-10-CM

## 2015-08-07 DIAGNOSIS — M19012 Primary osteoarthritis, left shoulder: Secondary | ICD-10-CM

## 2015-08-07 NOTE — Telephone Encounter (Signed)
Derrick Kelley and Angie Please discuss patient's radiofrequency procedure with Angie. I do not understand why the patient wants me to call him. Please try to get a clear understanding of what we can do to satisfy the patient and discussed with me. Thank you very much for assisting with this matter

## 2015-08-07 NOTE — Telephone Encounter (Signed)
Please call pt back concerning RF pt wants to know is he going to be able to have it done he said Dr. Primus Bravo was suppose to call him back

## 2015-08-08 DIAGNOSIS — M5412 Radiculopathy, cervical region: Secondary | ICD-10-CM | POA: Diagnosis not present

## 2015-08-08 NOTE — Telephone Encounter (Signed)
Patient called this am and scheduled the second facet block for 08/23/2015.

## 2015-08-10 DIAGNOSIS — M65311 Trigger thumb, right thumb: Secondary | ICD-10-CM | POA: Diagnosis not present

## 2015-08-10 DIAGNOSIS — G5601 Carpal tunnel syndrome, right upper limb: Secondary | ICD-10-CM | POA: Diagnosis not present

## 2015-08-10 DIAGNOSIS — G5602 Carpal tunnel syndrome, left upper limb: Secondary | ICD-10-CM | POA: Diagnosis not present

## 2015-08-11 DIAGNOSIS — G5602 Carpal tunnel syndrome, left upper limb: Secondary | ICD-10-CM | POA: Diagnosis not present

## 2015-08-11 HISTORY — PX: CARPAL TUNNEL RELEASE: SHX101

## 2015-08-17 ENCOUNTER — Ambulatory Visit: Payer: BLUE CROSS/BLUE SHIELD | Admitting: Pain Medicine

## 2015-08-18 ENCOUNTER — Encounter: Payer: Self-pay | Admitting: Pain Medicine

## 2015-08-18 ENCOUNTER — Ambulatory Visit: Payer: BLUE CROSS/BLUE SHIELD | Attending: Pain Medicine | Admitting: Pain Medicine

## 2015-08-18 VITALS — BP 146/90 | HR 68 | Temp 97.6°F | Resp 16 | Ht 70.0 in | Wt 185.0 lb

## 2015-08-18 DIAGNOSIS — M47817 Spondylosis without myelopathy or radiculopathy, lumbosacral region: Secondary | ICD-10-CM | POA: Diagnosis not present

## 2015-08-18 DIAGNOSIS — M5416 Radiculopathy, lumbar region: Secondary | ICD-10-CM | POA: Diagnosis not present

## 2015-08-18 DIAGNOSIS — M47816 Spondylosis without myelopathy or radiculopathy, lumbar region: Secondary | ICD-10-CM

## 2015-08-18 DIAGNOSIS — M25511 Pain in right shoulder: Secondary | ICD-10-CM | POA: Diagnosis present

## 2015-08-18 DIAGNOSIS — M542 Cervicalgia: Secondary | ICD-10-CM | POA: Diagnosis present

## 2015-08-18 DIAGNOSIS — M5010 Cervical disc disorder with radiculopathy, unspecified cervical region: Secondary | ICD-10-CM | POA: Diagnosis not present

## 2015-08-18 DIAGNOSIS — M961 Postlaminectomy syndrome, not elsewhere classified: Secondary | ICD-10-CM

## 2015-08-18 DIAGNOSIS — G5603 Carpal tunnel syndrome, bilateral upper limbs: Secondary | ICD-10-CM

## 2015-08-18 DIAGNOSIS — M19011 Primary osteoarthritis, right shoulder: Secondary | ICD-10-CM | POA: Insufficient documentation

## 2015-08-18 DIAGNOSIS — M791 Myalgia: Secondary | ICD-10-CM | POA: Diagnosis not present

## 2015-08-18 DIAGNOSIS — G5600 Carpal tunnel syndrome, unspecified upper limb: Secondary | ICD-10-CM | POA: Insufficient documentation

## 2015-08-18 DIAGNOSIS — M25512 Pain in left shoulder: Secondary | ICD-10-CM | POA: Diagnosis present

## 2015-08-18 DIAGNOSIS — Z9889 Other specified postprocedural states: Secondary | ICD-10-CM | POA: Insufficient documentation

## 2015-08-18 DIAGNOSIS — M533 Sacrococcygeal disorders, not elsewhere classified: Secondary | ICD-10-CM | POA: Diagnosis not present

## 2015-08-18 DIAGNOSIS — M5136 Other intervertebral disc degeneration, lumbar region: Secondary | ICD-10-CM | POA: Diagnosis not present

## 2015-08-18 DIAGNOSIS — M19012 Primary osteoarthritis, left shoulder: Secondary | ICD-10-CM | POA: Insufficient documentation

## 2015-08-18 DIAGNOSIS — M545 Low back pain: Secondary | ICD-10-CM | POA: Diagnosis not present

## 2015-08-18 DIAGNOSIS — M461 Sacroiliitis, not elsewhere classified: Secondary | ICD-10-CM

## 2015-08-18 DIAGNOSIS — M47818 Spondylosis without myelopathy or radiculopathy, sacral and sacrococcygeal region: Secondary | ICD-10-CM

## 2015-08-18 DIAGNOSIS — Z981 Arthrodesis status: Secondary | ICD-10-CM | POA: Diagnosis not present

## 2015-08-18 MED ORDER — OXYCODONE-ACETAMINOPHEN 7.5-325 MG PO TABS: ORAL_TABLET | ORAL | Status: DC

## 2015-08-18 MED ORDER — GABAPENTIN 300 MG PO CAPS: ORAL_CAPSULE | ORAL | Status: DC

## 2015-08-18 NOTE — Progress Notes (Signed)
Safety precautions to be maintained throughout the outpatient stay will include: orient to surroundings, keep bed in low position, maintain call bell within reach at all times, provide assistance with transfer out of bed and ambulation.  

## 2015-08-18 NOTE — Progress Notes (Signed)
Subjective:    Patient ID: Derrick Kelley, male    DOB: [birth year], 55 y.o.   MRN: [id number]  HPI  The patient is a 55 year old gentleman who returns to pain management for further evaluation and treatment of pain involving the neck shoulders and upper extremity regions mid back lower back and lower extremity regions. The patient states that he has pain radiating from the lumbar region toward the buttocks on the left as well as on the right is aggravated by standing walking twisting turning maneuvers. The patient is without recent trauma change in events of daily living the call significant change in symptomatology. The patient's pain being felt to be due to lumbar facet syndrome and we have requested insurance approval for radiofrequency rhizolysis lumbar facet, medial branch nerves. We will proceed with radiofrequency rhizolysis lumbar facet medial branch nerves at time return appointment as discussed pending insurance approval. The patient also is with pain involving the shoulder and is undergone prior surgical intervention of the shoulder by Dr. Tamera Punt The patient will follow-up with Dr. Tamera Punt in this regard as discussed. The patient will continue Zanaflex and Percocet and we will consider modification of treatment regimen pending follow-up evaluation response to present treatment. All agreed to suggested treatment plan.     Review of Systems     Objective:   Physical Exam  There was tenderness over the splenius capitis and occipitalis region of moderate degree with moderate tenderness of the cervical facet cervical paraspinal musculature region. Palpation of the acromioclavicular and glenohumeral joint regions reproduce moderate discomfort with limited range of motion of the shoulder. The patient appeared to be with decreased grip strength with Tinel and Phalen's maneuver reproducing mild to moderate discomfort. There was mild to moderate tenderness over the thoracic facet thoracic  paraspinal musculature region with no crepitus of the thoracic region noted. Palpation over the region of the lumbar region was attends to palpation with lateral bending rotation extension and palpation of the lumbar facets reproducing moderate discomfort. Straight leg raising was tolerates approximately 20 without increased pain with dorsiflexion noted. DTRs were difficult to elicit. EHL strength appeared to be slightly decreased without a definite sensory deficit or dermatomal distribution detected. There was negative clonus negative Homans. Palpation of the lumbar facet lumbar paraspinal musculature region reproduced the predominant portion of patient's pain.         Assessment & Plan:       Degenerative disc disease lumbar spine Status post L4-L5 posterior lumbar interbody fusion and decompression of the central canal with solid fusion L5-S1, L3-4 degenerative disc disease, moderate facet arthrosis.  Lumbar facet syndrome  Sacroiliac joint dysfunction  Degenerative joint disease of shoulders Status post surgery of shoulder  Degenerative disc disease of the cervical spine  Cervical radiculopathy  Cervical facet syndrome  Carpal tunnel syndrome      PLAN   Continue present medications Zanaflex  and Percocet    Lumbar facet, medial branch nerve blocks to be performed at time of return appointment  F/U PCP R Baity for evaliation of  BP and general medical condition  F/U with hand surgeon status post surgery of hand . Surgery performed by Dr. Grandville Silos  F/U surgical evaluation. Patient will follow-up with Dr. Tamera Punt for evaluation of shoulder . Patient will follow-up with Dr. Samule Dry for further evaluation of pain of neck and upper extremities as discussed  F/U neurological evaluation as discussed.  PNCV/EMG studies as plannedTo evaluate for carpal tone syndrome, cervical radiculopathy, and other abnormalities  Radiofrequency rhizolysis lumbar facet, medial branch  nerves to be performed pending insurance approval . As discussed we are waiting for insurance approval  [facility] to call [facility] should patient have concerns prior to scheduled return appointment

## 2015-08-18 NOTE — Patient Instructions (Addendum)
PLAN   Continue present medications Zanaflex  and Percocet    Lumbar facet, medial branch nerve blocks to be performed at time of return appointment  F/U PCP R Baity for evaliation of  BP and general medical condition  F/U with hand surgeon status post surgery of hand . Surgery performed by Dr. Grandville Silos  F/U surgical evaluation. Patient will follow-up with Dr. Tamera Punt for evaluation of shoulder . Patient will follow-up with Dr. Samule Dry for further evaluation of pain of neck and upper extremities as discussed  F/U neurological evaluation as discussed.  PNCV/EMG studies as plannedTo evaluate for carpal tone syndrome, cervical radiculopathy, and other abnormalities  May consider radiofrequency rhizolysis or intraspinal procedures pending response to present treatment and F/U evaluation . As discussed we are waiting for insurance approval  Patient to call Pain Management Center should patient have concerns prior to scheduled return appointmentFacet Joint Block The facet joints connect the bones of the spine (vertebrae). They make it possible for you to bend, twist, and make other movements with your spine. They also prevent you from overbending, overtwisting, and making other excessive movements.  A facet joint block is a procedure where a numbing medicine (anesthetic) is injected into a facet joint. Often, a type of anti-inflammatory medicine called a steroid is also injected. A facet joint block may be done for two reasons:   Diagnosis. A facet joint block may be done as a test to see whether neck or back pain is caused by a worn-down or infected facet joint. If the pain gets better after a facet joint block, it means the pain is probably coming from the facet joint. If the pain does not get better, it means the pain is probably not coming from the facet joint.   Therapy. A facet joint block may be done to relieve neck or back pain caused by a facet joint. A facet joint block is only done as  a therapy if the pain does not improve with medicine, exercise programs, physical therapy, and other forms of pain management. LET [facility] CARE PROVIDER KNOW ABOUT:   Any allergies you have.   All medicines you are taking, including vitamins, herbs, eyedrops, and over-the-counter medicines and creams.   Previous problems you or members of your family have had with the use of anesthetics.   Any blood disorders you have had.   Other health problems you have. RISKS AND COMPLICATIONS Generally, having a facet joint block is safe. However, as with any procedure, complications can occur. Possible complications associated with having a facet joint block include:   Bleeding.   Injury to a nerve near the injection site.   Pain at the injection site.   Weakness or numbness in areas controlled by nerves near the injection site.   Infection.   Temporary fluid retention.   Allergic reaction to anesthetics or medicines used during the procedure. BEFORE THE PROCEDURE   Follow your health care provider's instructions if you are taking dietary supplements or medicines. You may need to stop taking them or reduce your dosage.   Do not take any new dietary supplements or medicines without asking your health care provider first.   Follow your health care provider's instructions about eating and drinking before the procedure. You may need to stop eating and drinking several hours before the procedure.   Arrange to have an adult drive you home after the procedure. PROCEDURE  You may need to remove your clothing and dress in an open-back gown  so that your health care provider can access your spine.   The procedure will be done while you are lying on an X-ray table. Most of the time you will be asked to lie on your stomach, but you may be asked to lie in a different position if an injection will be made in your neck.   Special machines will be used to monitor your oxygen levels,  heart rate, and blood pressure.   If an injection will be made in your neck, an intravenous (IV) tube will be inserted into one of your veins. Fluids and medicine will flow directly into your body through the IV tube.   The area over the facet joint where the injection will be made will be cleaned with an antiseptic soap. The surrounding skin will be covered with sterile drapes.   An anesthetic will be applied to your skin to make the injection area numb. You may feel a temporary stinging or burning sensation.   A video X-ray machine will be used to locate the joint. A contrast dye may be injected into the facet joint area to help with locating the joint.   When the joint is located, an anesthetic medicine will be injected into the joint through the needle.   Your health care provider will ask you whether you feel pain relief. If you do feel relief, a steroid may be injected to provide pain relief for a longer period of time. If you do not feel relief or feel only partial relief, additional injections of an anesthetic may be made in other facet joints.   The needle will be removed, the skin will be cleansed, and bandages will be applied.  AFTER THE PROCEDURE   You will be observed for 15-30 minutes before being allowed to go home. Do not drive. Have an adult drive you or take a taxi or public transportation instead.   If you feel pain relief, the pain will return in several hours or days when the anesthetic wears off.   You may feel pain relief 2-14 days after the procedure. The amount of time this relief lasts varies from person to person.   It is normal to feel some tenderness over the injected area(s) for 2 days following the procedure.   If you have diabetes, you may have a temporary increase in blood sugar.   This information is not intended to replace advice given to you by your health care provider. Make sure you discuss any questions you have with your health care  provider.   Document Released: 06/19/2006 Document Revised: 02/18/2014 Document Reviewed: 11/18/2011 Elsevier Interactive Patient Education 2016 [place]  What are the risk, side effects and possible complications? Generally speaking, most procedures are safe.  However, with any procedure there are risks, side effects, and the possibility of complications.  The risks and complications are dependent upon the sites that are lesioned, or the type of nerve block to be performed.  The closer the procedure is to the spine, the more serious the risks are.  Great care is taken when placing the radio frequency needles, block needles or lesioning probes, but sometimes complications can occur.  Infection: Any time there is an injection through the skin, there is a risk of infection.  This is why sterile conditions are used for these blocks.  There are four possible types of infection.  Localized skin infection.  Central Nervous System Infection-This can be in the form of Meningitis, which  can be deadly.  Epidural Infections-This can be in the form of an epidural abscess, which can cause pressure inside of the spine, causing compression of the spinal cord with subsequent paralysis. This would require an emergency surgery to decompress, and there are no guarantees that the patient would recover from the paralysis.  Discitis-This is an infection of the intervertebral discs.  It occurs in about 1% of discography procedures.  It is difficult to treat and it may lead to surgery.        2. Pain: the needles have to go through skin and soft tissues, will cause soreness.       3. Damage to internal structures:  The nerves to be lesioned may be near blood vessels or    other nerves which can be potentially damaged.       4. Bleeding: Bleeding is more common if the patient is taking blood thinners such as  aspirin, Coumadin, Ticiid, Plavix, etc., or if he/she have some genetic  predisposition  such as hemophilia. Bleeding into the spinal canal can cause compression of the spinal  cord with subsequent paralysis.  This would require an emergency surgery to  decompress and there are no guarantees that the patient would recover from the  paralysis.       5. Pneumothorax:  Puncturing of a lung is a possibility, every time a needle is introduced in  the area of the chest or upper back.  Pneumothorax refers to free air around the  collapsed lung(s), inside of the thoracic cavity (chest cavity).  Another two possible  complications related to a similar event would include: Hemothorax and Chylothorax.   These are variations of the Pneumothorax, where instead of air around the collapsed  lung(s), you may have blood or chyle, respectively.       6. Spinal headaches: They may occur with any procedures in the area of the spine.       7. Persistent CSF (Cerebro-Spinal Fluid) leakage: This is a rare problem, but may occur  with prolonged intrathecal or epidural catheters either due to the formation of a fistulous  track or a dural tear.       8. Nerve damage: By working so close to the spinal cord, there is always a possibility of  nerve damage, which could be as serious as a permanent spinal cord injury with  paralysis.       9. Death:  Although rare, severe deadly allergic reactions known as "Anaphylactic  reaction" can occur to any of the medications used.      10. Worsening of the symptoms:  We can always make thing worse.  What are the chances of something like this happening? Chances of any of this occuring are extremely low.  By statistics, you have more of a chance of getting killed in a motor vehicle accident: while driving to the hospital than any of the above occurring .  Nevertheless, you should be aware that they are possibilities.  In general, it is similar to taking a shower.  Everybody knows that you can slip, hit your head and get killed.  Does that mean that you should not  shower again?  Nevertheless always keep in mind that statistics do not mean anything if you happen to be on the wrong side of them.  Even if a procedure has a 1 (one) in a 1,000,000 (million) chance of going wrong, it you happen to be that one..Also, keep in mind that by statistics, you  have more of a chance of having something go wrong when taking medications.  Who should not have this procedure? If you are on a blood thinning medication (e.g. Coumadin, Plavix, see list of "Blood Thinners"), or if you have an active infection going on, you should not have the procedure.  If you are taking any blood thinners, please inform your physician.  How should I prepare for this procedure?  Do not eat or drink anything at least six hours prior to the procedure.  Bring a driver with you .  It cannot be a taxi.  Come accompanied by an adult that can drive you back, and that is strong enough to help you if your legs get weak or numb from the local anesthetic.  Take all of your medicines the morning of the procedure with just enough water to swallow them.  If you have diabetes, make sure that you are scheduled to have your procedure done first thing in the morning, whenever possible.  If you have diabetes, take only half of your insulin dose and notify our nurse that you have done so as soon as you arrive at the clinic.  If you are diabetic, but only take blood sugar pills (oral hypoglycemic), then do not take them on the morning of your procedure.  You may take them after you have had the procedure.  Do not take aspirin or any aspirin-containing medications, at least eleven (11) days prior to the procedure.  They may prolong bleeding.  Wear loose fitting clothing that may be easy to take off and that you would not mind if it got stained with Betadine or blood.  Do not wear any jewelry or perfume  Remove any nail coloring.  It will interfere with some of our monitoring equipment.  NOTE: Remember that  this is not meant to be interpreted as a complete list of all possible complications.  Unforeseen problems may occur.  BLOOD THINNERS The following drugs contain aspirin or other products, which can cause increased bleeding during surgery and should not be taken for 2 weeks prior to and 1 week after surgery.  If you should need take something for relief of minor pain, you may take acetaminophen which is found in Tylenol,m Datril, Anacin-3 and Panadol. It is not blood thinner. The products listed below are.  Do not take any of the products listed below in addition to any listed on your instruction sheet.  A.P.C or A.P.C with Codeine Codeine Phosphate Capsules #3 Ibuprofen Ridaura  ABC compound Congesprin Imuran rimadil  Advil Cope Indocin Robaxisal  Alka-Seltzer Effervescent Pain Reliever and Antacid Coricidin or Coricidin-D  Indomethacin Rufen  Alka-Seltzer plus Cold Medicine Cosprin Ketoprofen S-A-C Tablets  Anacin Analgesic Tablets or Capsules Coumadin Korlgesic Salflex  Anacin Extra Strength Analgesic tablets or capsules CP-2 Tablets Lanoril Salicylate  Anaprox Cuprimine Capsules Levenox Salocol  Anexsia-D Dalteparin Magan Salsalate  Anodynos Darvon compound Magnesium Salicylate Sine-off  Ansaid Dasin Capsules Magsal Sodium Salicylate  Anturane Depen Capsules Marnal Soma  APF Arthritis pain formula Dewitt's Pills Measurin Stanback  Argesic Dia-Gesic Meclofenamic Sulfinpyrazone  Arthritis Bayer Timed Release Aspirin Diclofenac Meclomen Sulindac  Arthritis pain formula Anacin Dicumarol Medipren Supac  Analgesic (Safety coated) Arthralgen Diffunasal Mefanamic Suprofen  Arthritis Strength Bufferin Dihydrocodeine Mepro Compound Suprol  Arthropan liquid Dopirydamole Methcarbomol with Aspirin Synalgos  ASA tablets/Enseals Disalcid Micrainin Tagament  Ascriptin Doan's Midol Talwin  Ascriptin A/D Dolene Mobidin Tanderil  Ascriptin Extra Strength Dolobid Moblgesic Ticlid  Ascriptin with Codeine  Doloprin  or Doloprin with Codeine Momentum Tolectin  Asperbuf Duoprin Mono-gesic Trendar  Aspergum Duradyne Motrin or Motrin IB Triminicin  Aspirin plain, buffered or enteric coated Durasal Myochrisine Trigesic  Aspirin Suppositories Easprin Nalfon Trillsate  Aspirin with Codeine Ecotrin Regular or Extra Strength Naprosyn Uracel  Atromid-S Efficin Naproxen Ursinus  Auranofin Capsules Elmiron Neocylate Vanquish  Axotal Emagrin Norgesic Verin  Azathioprine Empirin or Empirin with Codeine Normiflo Vitamin E  Azolid Emprazil Nuprin Voltaren  Bayer Aspirin plain, buffered or children's or timed BC Tablets or powders Encaprin Orgaran Warfarin Sodium  Buff-a-Comp Enoxaparin Orudis Zorpin  Buff-a-Comp with Codeine Equegesic Os-Cal-Gesic   Buffaprin Excedrin plain, buffered or Extra Strength Oxalid   Bufferin Arthritis Strength Feldene Oxphenbutazone   Bufferin plain or Extra Strength Feldene Capsules Oxycodone with Aspirin   Bufferin with Codeine Fenoprofen Fenoprofen Pabalate or Pabalate-SF   Buffets II Flogesic Panagesic   Buffinol plain or Extra Strength Florinal or Florinal with Codeine Panwarfarin   Buf-Tabs Flurbiprofen Penicillamine   Butalbital Compound Four-way cold tablets Penicillin   Butazolidin Fragmin Pepto-Bismol   Carbenicillin Geminisyn Percodan   Carna Arthritis Reliever Geopen Persantine   Carprofen Gold's salt Persistin   Chloramphenicol Goody's Phenylbutazone   Chloromycetin Haltrain Piroxlcam   Clmetidine heparin Plaquenil   Cllnoril Hyco-pap Ponstel   Clofibrate Hydroxy chloroquine Propoxyphen         Before stopping any of these medications, be sure to consult the physician who ordered them.  Some, such as Coumadin (Warfarin) are ordered to prevent or treat serious conditions such as "deep thrombosis", "pumonary embolisms", and other heart problems.  The amount of time that you may need off of the medication may also vary with the medication and the reason for which  you were taking it.  If you are taking any of these medications, please make sure you notify your pain physician before you undergo any procedures.

## 2015-08-23 ENCOUNTER — Encounter: Payer: Self-pay | Admitting: Pain Medicine

## 2015-08-23 ENCOUNTER — Ambulatory Visit: Payer: BLUE CROSS/BLUE SHIELD | Attending: Pain Medicine | Admitting: Pain Medicine

## 2015-08-23 DIAGNOSIS — M791 Myalgia: Secondary | ICD-10-CM | POA: Diagnosis not present

## 2015-08-23 DIAGNOSIS — M19012 Primary osteoarthritis, left shoulder: Secondary | ICD-10-CM

## 2015-08-23 DIAGNOSIS — M47816 Spondylosis without myelopathy or radiculopathy, lumbar region: Secondary | ICD-10-CM

## 2015-08-23 DIAGNOSIS — M5136 Other intervertebral disc degeneration, lumbar region: Secondary | ICD-10-CM | POA: Diagnosis not present

## 2015-08-23 DIAGNOSIS — M5416 Radiculopathy, lumbar region: Secondary | ICD-10-CM | POA: Diagnosis not present

## 2015-08-23 DIAGNOSIS — M47817 Spondylosis without myelopathy or radiculopathy, lumbosacral region: Secondary | ICD-10-CM

## 2015-08-23 DIAGNOSIS — G5603 Carpal tunnel syndrome, bilateral upper limbs: Secondary | ICD-10-CM

## 2015-08-23 DIAGNOSIS — Z981 Arthrodesis status: Secondary | ICD-10-CM | POA: Diagnosis not present

## 2015-08-23 DIAGNOSIS — M47818 Spondylosis without myelopathy or radiculopathy, sacral and sacrococcygeal region: Secondary | ICD-10-CM

## 2015-08-23 DIAGNOSIS — M461 Sacroiliitis, not elsewhere classified: Secondary | ICD-10-CM

## 2015-08-23 DIAGNOSIS — M79606 Pain in leg, unspecified: Secondary | ICD-10-CM | POA: Diagnosis present

## 2015-08-23 DIAGNOSIS — M5126 Other intervertebral disc displacement, lumbar region: Secondary | ICD-10-CM | POA: Diagnosis not present

## 2015-08-23 DIAGNOSIS — M533 Sacrococcygeal disorders, not elsewhere classified: Secondary | ICD-10-CM | POA: Diagnosis not present

## 2015-08-23 DIAGNOSIS — M19011 Primary osteoarthritis, right shoulder: Secondary | ICD-10-CM

## 2015-08-23 DIAGNOSIS — M961 Postlaminectomy syndrome, not elsewhere classified: Secondary | ICD-10-CM

## 2015-08-23 DIAGNOSIS — M5010 Cervical disc disorder with radiculopathy, unspecified cervical region: Secondary | ICD-10-CM

## 2015-08-23 DIAGNOSIS — M545 Low back pain: Secondary | ICD-10-CM | POA: Diagnosis present

## 2015-08-23 MED ORDER — BUPIVACAINE HCL (PF) 0.25 % IJ SOLN
30.0000 mL | Freq: Once | INTRAMUSCULAR | Status: AC
  Administered 2015-08-23: 30 mL
  Filled 2015-08-23: qty 30

## 2015-08-23 MED ORDER — TRIAMCINOLONE ACETONIDE 40 MG/ML IJ SUSP
40.0000 mg | Freq: Once | INTRAMUSCULAR | Status: AC
  Administered 2015-08-23: 40 mg
  Filled 2015-08-23: qty 1

## 2015-08-23 MED ORDER — LACTATED RINGERS IV SOLN
1000.0000 mL | INTRAVENOUS | Status: DC
  Administered 2015-08-23: 1000 mL via INTRAVENOUS

## 2015-08-23 MED ORDER — CEFUROXIME AXETIL 250 MG PO TABS: 250.0000 mg | ORAL_TABLET | Freq: Two times a day (BID) | ORAL | Status: DC

## 2015-08-23 MED ORDER — MIDAZOLAM HCL 5 MG/5ML IJ SOLN
5.0000 mg | Freq: Once | INTRAMUSCULAR | Status: AC
  Administered 2015-08-23: 5 mg via INTRAVENOUS
  Filled 2015-08-23: qty 5

## 2015-08-23 MED ORDER — CEFAZOLIN IN D5W 1 GM/50ML IV SOLN
1.0000 g | Freq: Once | INTRAVENOUS | Status: AC
  Administered 2015-08-23: 1 g via INTRAVENOUS

## 2015-08-23 MED ORDER — FENTANYL CITRATE (PF) 100 MCG/2ML IJ SOLN
100.0000 ug | Freq: Once | INTRAMUSCULAR | Status: AC
  Administered 2015-08-23: 100 ug via INTRAVENOUS
  Filled 2015-08-23: qty 2

## 2015-08-23 MED ORDER — ORPHENADRINE CITRATE 30 MG/ML IJ SOLN
60.0000 mg | Freq: Once | INTRAMUSCULAR | Status: AC
  Administered 2015-08-23: 60 mg via INTRAMUSCULAR
  Filled 2015-08-23: qty 2

## 2015-08-23 MED ORDER — CEFAZOLIN SODIUM 1 G IJ SOLR
INTRAMUSCULAR | Status: AC
  Administered 2015-08-23: 13:00:00
  Filled 2015-08-23: qty 10

## 2015-08-23 NOTE — Progress Notes (Signed)
Safety precautions to be maintained throughout the outpatient stay will include: orient to surroundings, keep bed in low position, maintain call bell within reach at all times, provide assistance with transfer out of bed and ambulation.  

## 2015-08-23 NOTE — Progress Notes (Signed)
Subjective:    Patient ID: Derrick Kelley, male    DOB: 1960/04/06, 55 y.o.   MRN: [id number]  HPI  PROCEDURE PERFORMED: Lumbar facet (medial branch block)   NOTE: The patient is a 55 y.o. male who returns to [facility] for further evaluation and treatment of pain involving the lumbar and lower extremity region. MRI  revealed the patient to be with evidence of degenerative disc disease lumbar spine Status post L4-L5 posterior lumbar interbody fusion and decompression of the central canal with solid fusion L5-S1, L3-4 degenerative disc disease, moderate facet arthrosis... There is concern regarding significant component of patient's pain being due to facet syndrome The risks, benefits, and expectations of the procedure have been discussed and explained to the patient who was understanding and in agreement with suggested treatment plan. We will proceed with interventional treatment as discussed and as explained to the patient who was understanding and wished to proceed with procedure as planned.   DESCRIPTION OF PROCEDURE: Lumbar facet (medial branch block) with IV Versed, IV fentanyl conscious sedation, EKG, blood pressure, pulse, capnography, and pulse oximetry monitoring. The procedure was performed with the patient in the prone position. Betadine prep of proposed entry site performed.   NEEDLE PLACEMENT AT: Left L 22 lumbar facet (medial branch block). Under fluoroscopic guidance with oblique orientation of 15 degrees, a 22-gauge needle was inserted at the L  vertebral body level with needle placed at the targeted area of Burton's Eye or Eye of the Scotty Dog with documentation of needle placement in the superior and lateral border of targeted area of Burton's Eye or Eye of the Scotty Dog with oblique orientation of 15 degrees. Following documentation of needle placement at the L 2 vertebral body level, needle placement was then accomplished at the L 3 vertebral body level.   NEEDLE  PLACEMENT AT L3 and L4 VERTEBRAL BODY LEVELS ON THE LEFT SIDE The procedure was performed at the L3 and L4 vertebral body levels exactly as was performed at the L 2 vertebral body level utilizing the same technique and under fluoroscopic guidance.  NEEDLE PLACEMENT AT THE SACRAL ALA with AP view of the lumbosacral spine. With the patient in the prone position, Betadine prep of proposed entry site accomplished, a 22 gauge needle was inserted in the region of the sacral ala (groove formed by the superior articulating process of S1 and the sacral wing). Following documentation of needle placement at the sacral ala,   NEEDLE PLACEMENT AT THE S1 FORAMEN LEVEL under fluoroscopic guidance with AP view of the lumbosacral spine and cephalad orientation of the fluoroscope, a 22-gauge needle was placed at the superior and lateral border of the S1 foramen under fluoroscopic guidance. Following documentation of needle placement at the S1 foramen.   Needle placement was then verified at all levels on lateral view. Following documentation of needle placement at all levels on lateral view and following negative aspiration for heme and CSF, each level was injected with 1 mL of 0.25% bupivacaine with Kenalog.     LUMBAR FACET, MEDIAL BRANCH NERVE, BLOCKS PERFORMED ON THE RIGHT SIDE   The procedure was performed on the right side exactly as was performed on the left side at the same levels and utilizing the same technique under fluoroscopic guidance.     The patient tolerated the procedure well. A total of 40 mg of Kenalog was utilized for the procedure.   PLAN:  1. Medications: The patient will continue presently prescribed medications. Zanaflex  and oxycodone acetaminophen 2. May consider modification of treatment regimen at time of return appointment pending response to treatment rendered on today's visit. 3. The patient is to follow-up with primary care physician  R Baity for further evaluation of blood  pressure and general medical condition status post steroid injection performed on today's visit. 4. Surgical follow-up evaluation. Has been addressed 5. Neurological follow-up evaluation. Has been addressed 6. The patient may be candidate for radiofrequency procedures, implantation type procedures, and other treatment pending response to treatment and follow-up evaluation. 7. The patient has been advised to call the Pain Management Center prior to scheduled return appointment should there be significant change in condition or should patient have other concerns regarding condition prior to scheduled return appointment.  The patient is understanding and in agreement with suggested treatment plan.   Review of Systems     Objective:   Physical Exam        Assessment & Plan:

## 2015-08-23 NOTE — Patient Instructions (Addendum)
PLAN   Continue present medications Zanaflex  and Percocet. Please obtain Ceftin antibiotic today and begin taking Ceftin antibiotic as prescribed    F/U PCP R Baity for evaluation of  BP and general medical condition  F/U with hand surgeon status post surgery of hand . Surgery performed by Dr. Grandville Silos  F/U surgical evaluation. Patient will follow-up with Dr. Tamera Punt for evaluation of shoulder . Patient will follow-up with Dr. Samule Dry for further evaluation of pain of neck and upper extremities and lumbar and lower extremities as discussed  F/U neurological evaluation as discussed.  PNCV/EMG studies as plannedTo evaluate for carpal tone syndrome, cervical radiculopathy, and other abnormalities  May consider radiofrequency rhizolysis or intraspinal procedures pending response to present treatment and F/U evaluation . As discussed we are waiting for insurance approval  Patient to call Pain Management Center should patient have concerns prior to scheduled return appointment A prescription for Ceftin was sent to your pharmacy. Pain Management Discharge Instructions  General Discharge Instructions :  If you need to reach your doctor call: Monday-Friday 8:00 am - 4:00 pm at [phone] or toll free [phone].  After clinic hours [phone] to have operator reach doctor.  Bring all of your medication bottles to all your appointments in the pain clinic.  To cancel or reschedule your appointment with Pain Management please remember to call 24 hours in advance to avoid a fee.  Refer to the educational materials which you have been given on: General Risks, I had my Procedure. Discharge Instructions, Post Sedation.  Post Procedure Instructions:  The drugs you were given will stay in your system until tomorrow, so for the next 24 hours you should not drive, make any legal decisions or drink any alcoholic beverages.  You may eat anything you prefer, but it is better to start with  liquids then soups and crackers, and gradually work up to solid foods.  Please notify your doctor immediately if you have any unusual bleeding, trouble breathing or pain that is not related to your normal pain.  Depending on the type of procedure that was done, some parts of your body may feel week and/or numb.  This usually clears up by tonight or the next day.  Walk with the use of an assistive device or accompanied by an adult for the 24 hours.  You may use ice on the affected area for the first 24 hours.  Put ice in a Ziploc bag and cover with a towel and place against area 15 minutes on 15 minutes off.  You may switch to heat after 24 hours.

## 2015-08-24 ENCOUNTER — Other Ambulatory Visit: Payer: Self-pay

## 2015-08-24 ENCOUNTER — Telehealth: Payer: Self-pay | Admitting: *Deleted

## 2015-08-24 MED ORDER — ZOLPIDEM TARTRATE ER 12.5 MG PO TBCR: 12.5000 mg | EXTENDED_RELEASE_TABLET | Freq: Every day | ORAL | Status: DC

## 2015-08-24 MED ORDER — SUVOREXANT 10 MG PO TABS: 1.0000 | ORAL_TABLET | Freq: Every day | ORAL | Status: DC

## 2015-08-24 NOTE — Telephone Encounter (Signed)
Ok to phone in [place] [facility] [place]

## 2015-08-24 NOTE — Telephone Encounter (Signed)
Pt request refill belsomra (last refilled # 30 on 07/25/15) and ambien (alst refilled # 30 on 07/24/15). Pt last seen 07/25/15.[place]

## 2015-08-24 NOTE — Telephone Encounter (Signed)
Message left

## 2015-08-25 NOTE — Telephone Encounter (Signed)
Rx called in to pharmacy. 

## 2015-08-26 LAB — TOXASSURE SELECT 13 (MW), URINE: PDF: 0

## 2015-08-29 DIAGNOSIS — G5602 Carpal tunnel syndrome, left upper limb: Secondary | ICD-10-CM | POA: Diagnosis not present

## 2015-09-16 ENCOUNTER — Other Ambulatory Visit: Payer: Self-pay | Admitting: Pain Medicine

## 2015-09-19 ENCOUNTER — Ambulatory Visit: Payer: BLUE CROSS/BLUE SHIELD | Attending: Pain Medicine | Admitting: Pain Medicine

## 2015-09-19 ENCOUNTER — Encounter: Payer: Self-pay | Admitting: Pain Medicine

## 2015-09-19 ENCOUNTER — Other Ambulatory Visit: Payer: Self-pay | Admitting: Pain Medicine

## 2015-09-19 VITALS — BP 156/90 | Temp 97.8°F | Resp 16 | Ht 68.0 in | Wt 180.0 lb

## 2015-09-19 DIAGNOSIS — M19012 Primary osteoarthritis, left shoulder: Secondary | ICD-10-CM

## 2015-09-19 DIAGNOSIS — M961 Postlaminectomy syndrome, not elsewhere classified: Secondary | ICD-10-CM

## 2015-09-19 DIAGNOSIS — M5136 Other intervertebral disc degeneration, lumbar region: Secondary | ICD-10-CM | POA: Insufficient documentation

## 2015-09-19 DIAGNOSIS — M47818 Spondylosis without myelopathy or radiculopathy, sacral and sacrococcygeal region: Secondary | ICD-10-CM

## 2015-09-19 DIAGNOSIS — M461 Sacroiliitis, not elsewhere classified: Secondary | ICD-10-CM

## 2015-09-19 DIAGNOSIS — M19011 Primary osteoarthritis, right shoulder: Secondary | ICD-10-CM

## 2015-09-19 DIAGNOSIS — G5600 Carpal tunnel syndrome, unspecified upper limb: Secondary | ICD-10-CM | POA: Diagnosis not present

## 2015-09-19 DIAGNOSIS — M5010 Cervical disc disorder with radiculopathy, unspecified cervical region: Secondary | ICD-10-CM | POA: Diagnosis not present

## 2015-09-19 DIAGNOSIS — M47817 Spondylosis without myelopathy or radiculopathy, lumbosacral region: Secondary | ICD-10-CM

## 2015-09-19 DIAGNOSIS — M47816 Spondylosis without myelopathy or radiculopathy, lumbar region: Secondary | ICD-10-CM | POA: Insufficient documentation

## 2015-09-19 DIAGNOSIS — M791 Myalgia: Secondary | ICD-10-CM | POA: Diagnosis not present

## 2015-09-19 DIAGNOSIS — Z981 Arthrodesis status: Secondary | ICD-10-CM | POA: Insufficient documentation

## 2015-09-19 DIAGNOSIS — M546 Pain in thoracic spine: Secondary | ICD-10-CM | POA: Diagnosis present

## 2015-09-19 DIAGNOSIS — M533 Sacrococcygeal disorders, not elsewhere classified: Secondary | ICD-10-CM | POA: Insufficient documentation

## 2015-09-19 DIAGNOSIS — M5416 Radiculopathy, lumbar region: Secondary | ICD-10-CM | POA: Diagnosis not present

## 2015-09-19 DIAGNOSIS — Z9889 Other specified postprocedural states: Secondary | ICD-10-CM | POA: Diagnosis not present

## 2015-09-19 DIAGNOSIS — M542 Cervicalgia: Secondary | ICD-10-CM | POA: Diagnosis present

## 2015-09-19 MED ORDER — TIZANIDINE HCL 4 MG PO TABS: ORAL_TABLET | ORAL | 2 refills | Status: DC

## 2015-09-19 MED ORDER — OXYCODONE-ACETAMINOPHEN 7.5-325 MG PO TABS: ORAL_TABLET | ORAL | 0 refills | Status: DC

## 2015-09-19 NOTE — Patient Instructions (Addendum)
PLAN   Continue present medications Zanaflex  Neurontin and Percocet. Continue Mobic as prescribed by Dr. Bridgett Larsson  Radiofrequency rhizolysis lumbar facet, medial branch nerves to be performed at time return appointment pending insurance approval Please ask the nurses and secretary if insurance has approved you for the radiofrequency procedure  F/U PCP R Baity for evaliation of  BP and general medical condition  F/U with hand surgeon status post surgery of hand . Surgery performed by Dr. Grandville Silos  F/U surgical evaluation. Patient will follow-up with Dr. Tamera Punt for evaluation of shoulder . Patient will follow-up with Dr. Samule Dry for further evaluation of pain of shoulder neck and upper extremities as discussed  F/U neurological evaluation   May consider radiofrequency rhizolysis or intraspinal procedures pending response to present treatment and F/U evaluation . As discussed we are waiting for insurance approval  Patient to call [facility] should patient have concerns prior to scheduled return appointment  Radiofrequency Lesioning Radiofrequency lesioning is a procedure that is performed to relieve pain. The procedure is often used for back, neck, or arm pain. Radiofrequency lesioning involves the use of a machine that creates radio waves to make heat. During the procedure, the heat is applied to the nerve that carries the pain signal. The heat damages the nerve and interferes with the pain signal. Pain relief usually lasts for 6 months to 1 year. LET [facility] CARE PROVIDER KNOW ABOUT:  Any allergies you have.  All medicines you are taking, including vitamins, herbs, eye drops, creams, and over-the-counter medicines.  Previous problems you or members of your family have had with the use of anesthetics.  Any blood disorders you have.  Previous surgeries you have had.  Any medical conditions you have.  Whether you are pregnant or may be pregnant. RISKS AND  COMPLICATIONS Generally, this is a safe procedure. However, problems may occur, including:  Pain or soreness at the injection site.  Infection at the injection site.  Damage to nerves or blood vessels. BEFORE THE PROCEDURE  Ask your health care provider about:  Changing or stopping your regular medicines. This is especially important if you are taking diabetes medicines or blood thinners.  Taking medicines such as aspirin and ibuprofen. These medicines can thin your blood. Do not take these medicines before your procedure if your health care provider instructs you not to.  Follow instructions from your health care provider about eating or drinking restrictions.  Plan to have someone take you home after the procedure.  If you go home right after the procedure, plan to have someone with you for 24 hours. PROCEDURE  You will be given one or more of the following:  A medicine to help you relax (sedative).  A medicine to numb the area (local anesthetic).  You will be awake during the procedure. You will need to be able to talk with the health care provider during the procedure.  With the help of a type of X-ray (fluoroscopy), the health care provider will insert a radiofrequency needle into the area to be treated.  Next, a wire that carries the radio waves (electrode) will be put through the radiofrequency needle. An electrical pulse will be sent through the electrode to verify the correct nerve. You will feel a tingling sensation, and you may have muscle twitching.  Then, the tissue that is around the needle tip will be heated by an electric current that is passed using the radiofrequency machine. This will numb the nerves.  A bandage (dressing)  will be put on the insertion area after the procedure is done. The procedure may vary among health care providers and hospitals. AFTER THE PROCEDURE  Your blood pressure, heart rate, breathing rate, and blood oxygen level will be  monitored often until the medicines you were given have worn off.  Return to your normal activities as directed by your health care provider.   This information is not intended to replace advice given to you by your health care provider. Make sure you discuss any questions you have with your health care provider.   Document Released: 09/26/2010 Document Revised: 10/19/2014 Document Reviewed: 03/07/2014 Elsevier Interactive Patient Education [employer].

## 2015-09-19 NOTE — Progress Notes (Signed)
Safety precautions to be maintained throughout the outpatient stay will include: orient to surroundings, keep bed in low position, maintain call bell within reach at all times, provide assistance with transfer out of bed and ambulation.  

## 2015-09-19 NOTE — Progress Notes (Signed)
The patient is a 55 year old gentleman who returns to pain management for further evaluation and treatment of pain involving the neck upper back region shoulder especially on the left as well as the mid back lower back and lower extremity region. The patient is status post surgery of the left shoulder as planned advised to follow-up with orthopedic surgeon Dr. Tamera Punt in this regard. The patient is a significant lumbar and lower extremity pain. We have requested insurance approval for patient to be able to undergo radiofrequency rhizolysis lumbar facet, medial branch nerves at L4, L4-5, and L3 on the left side. We are awaiting insurance approval in this regard. The patient denies any trauma change in events of daily living the call significant change in symptomatology. The patient is tolerating medications well without any undesirable side effects. We discussed patient's condition on today's visit and we'll continue medications consisting of Zanaflex Neurontin oxycodone acetaminophen and Mobic area the patient was understanding and agreed to suggested treatment plan. We will await insurance approval for radiofrequency rhizolysis lumbar facet medial branch nerves and will proceed with such treatment once approval has been obtained. All agreed to suggested treatment plan      Physical examination   There was tenderness to palpation of the paraspinal musculature region cervical region cervical facet region a mild to moderate degree with palpation over the splenius capitis and occipitalis region reproducing mild to moderate discomfort. Palpation of the acromioclavicular and glenohumeral joint region reproduced severe pain with performed on the left compared to the right there was well-healed surgical scar of the shoulder noted without increased warmth and erythema in the region scar. Palpation over the region of the thoracic region was without crepitus of the thoracic region noted. Palpation over the  region of the lumbar region was of increased pain with lateral bending rotation extension and palpation of the lumbar facets reproducing moderate to moderately severe discomfort. There was tenderness to palpation over the region of the PSIS and PII S region a moderate. Palpation of the greater trochanteric region iliotibial band region was without increased pain. Straight leg raise was tolerates approximately 30 without increased pain with dorsiflexion noted. DTRs appeared to be trace at the knees. There was negative clonus negative Homans. There was increased pain of mild-to-moderate degree with pressure applied to the ileum with patient in lateral decubitus position.       Assessment    Degenerative disc disease lumbar spine Status post L4-L5 posterior lumbar interbody fusion and decompression of the central canal with solid fusion L5-S1, L3-4 degenerative disc disease, moderate facet arthrosis.  Lumbar facet syndrome  Sacroiliac joint dysfunction  Degenerative joint disease of shoulders Status post surgery of shoulder  Degenerative disc disease of the cervical spine  Cervical radiculopathy  Cervical facet syndrome  Carpal tunnel syndrome      PLAN   Continue present medications Zanaflex  Neurontin and Percocet. Continue Mobic as prescribed by Dr. Bridgett Larsson  Radiofrequency rhizolysis lumbar facet, medial branch nerves to be performed at time return appointment pending insurance approval Please ask the nurses and secretary if insurance has approved you for the radiofrequency procedure  F/U PCP R Baity for evaliation of  BP and general medical condition  F/U with hand surgeon status post surgery of hand . Surgery performed by Dr. Grandville Silos  F/U surgical evaluation. Patient will follow-up with Dr. Tamera Punt for evaluation of shoulder . Patient will follow-up with Dr. Samule Dry for further evaluation of pain of shoulder neck and upper extremities as discussed  F/U neurological  evaluation   May consider radiofrequency rhizolysis or intraspinal procedures pending response to present treatment and F/U evaluation . As discussed we are waiting for insurance approval  Patient to call Pain Management Center should patient have concerns prior to scheduled return appointmen

## 2015-09-21 ENCOUNTER — Ambulatory Visit (INDEPENDENT_AMBULATORY_CARE_PROVIDER_SITE_OTHER): Payer: BLUE CROSS/BLUE SHIELD | Admitting: Internal Medicine

## 2015-09-21 ENCOUNTER — Encounter: Payer: Self-pay | Admitting: Internal Medicine

## 2015-09-21 VITALS — BP 138/72 | HR 64 | Temp 97.8°F | Ht 68.0 in | Wt 186.0 lb

## 2015-09-21 DIAGNOSIS — M961 Postlaminectomy syndrome, not elsewhere classified: Secondary | ICD-10-CM | POA: Diagnosis not present

## 2015-09-21 DIAGNOSIS — K219 Gastro-esophageal reflux disease without esophagitis: Secondary | ICD-10-CM

## 2015-09-21 DIAGNOSIS — I10 Essential (primary) hypertension: Secondary | ICD-10-CM | POA: Diagnosis not present

## 2015-09-21 DIAGNOSIS — E291 Testicular hypofunction: Secondary | ICD-10-CM

## 2015-09-21 DIAGNOSIS — M549 Dorsalgia, unspecified: Secondary | ICD-10-CM

## 2015-09-21 DIAGNOSIS — Z1159 Encounter for screening for other viral diseases: Secondary | ICD-10-CM

## 2015-09-21 DIAGNOSIS — Z0000 Encounter for general adult medical examination without abnormal findings: Secondary | ICD-10-CM

## 2015-09-21 DIAGNOSIS — G8929 Other chronic pain: Secondary | ICD-10-CM

## 2015-09-21 DIAGNOSIS — Z114 Encounter for screening for human immunodeficiency virus [HIV]: Secondary | ICD-10-CM | POA: Diagnosis not present

## 2015-09-21 DIAGNOSIS — G4700 Insomnia, unspecified: Secondary | ICD-10-CM

## 2015-09-21 DIAGNOSIS — E785 Hyperlipidemia, unspecified: Secondary | ICD-10-CM

## 2015-09-21 DIAGNOSIS — F329 Major depressive disorder, single episode, unspecified: Secondary | ICD-10-CM

## 2015-09-21 DIAGNOSIS — N400 Enlarged prostate without lower urinary tract symptoms: Secondary | ICD-10-CM

## 2015-09-21 DIAGNOSIS — F32A Depression, unspecified: Secondary | ICD-10-CM

## 2015-09-21 LAB — CBC WITH DIFFERENTIAL/PLATELET
BASOS PCT: 0.3 % (ref 0.0–3.0)
Basophils Absolute: 0 10*3/uL (ref 0.0–0.1)
EOS PCT: 3.1 % (ref 0.0–5.0)
Eosinophils Absolute: 0.2 10*3/uL (ref 0.0–0.7)
HCT: 47.1 % (ref 39.0–52.0)
Hemoglobin: 16.3 g/dL (ref 13.0–17.0)
LYMPHS ABS: 1.2 10*3/uL (ref 0.7–4.0)
Lymphocytes Relative: 15.2 % (ref 12.0–46.0)
MCHC: 34.6 g/dL (ref 30.0–36.0)
MCV: 90.2 fl (ref 78.0–100.0)
MONO ABS: 0.4 10*3/uL (ref 0.1–1.0)
Monocytes Relative: 5.6 % (ref 3.0–12.0)
NEUTROS ABS: 5.8 10*3/uL (ref 1.4–7.7)
NEUTROS PCT: 75.8 % (ref 43.0–77.0)
PLATELETS: 215 10*3/uL (ref 150.0–400.0)
RBC: 5.22 Mil/uL (ref 4.22–5.81)
RDW: 13.6 % (ref 11.5–15.5)
WBC: 7.7 10*3/uL (ref 4.0–10.5)

## 2015-09-21 LAB — COMPREHENSIVE METABOLIC PANEL
ALBUMIN: 4.5 g/dL (ref 3.5–5.2)
ALT: 15 U/L (ref 0–53)
AST: 20 U/L (ref 0–37)
Alkaline Phosphatase: 68 U/L (ref 39–117)
BUN: 24 mg/dL — AB (ref 6–23)
CHLORIDE: 101 meq/L (ref 96–112)
CO2: 32 mEq/L (ref 19–32)
Calcium: 9.5 mg/dL (ref 8.4–10.5)
Creatinine, Ser: 1.39 mg/dL (ref 0.40–1.50)
GFR: 56.37 mL/min — ABNORMAL LOW (ref >60.00)
GLUCOSE: 102 mg/dL — AB (ref 70–99)
POTASSIUM: 4.3 meq/L (ref 3.5–5.1)
SODIUM: 138 meq/L (ref 135–145)
Total Bilirubin: 0.7 mg/dL (ref 0.2–1.2)
Total Protein: 6.8 g/dL (ref 6.0–8.3)

## 2015-09-21 LAB — LIPID PANEL
CHOL/HDL RATIO: 4
CHOLESTEROL: 155 mg/dL (ref 0–200)
HDL: 39.5 mg/dL (ref >39.00)
NonHDL: 115.94
Triglycerides: 345 mg/dL — ABNORMAL HIGH (ref 0.0–149.0)
VLDL: 69 mg/dL — AB (ref 0.0–40.0)

## 2015-09-21 LAB — LDL CHOLESTEROL, DIRECT: Direct LDL: 95 mg/dL

## 2015-09-21 NOTE — Assessment & Plan Note (Deleted)
Continue Zoloft Call if symptoms worsen

## 2015-09-21 NOTE — Assessment & Plan Note (Deleted)
Continue Flexeril, Percocet Followup with pain management

## 2015-09-21 NOTE — Assessment & Plan Note (Deleted)
Controlled with diet Call if symptoms worsen

## 2015-09-21 NOTE — Assessment & Plan Note (Deleted)
Continue Avapro and Norvasc EKG today was normal Will check CBC, CMET today

## 2015-09-21 NOTE — Assessment & Plan Note (Deleted)
Continue Ambien. 

## 2015-09-21 NOTE — Assessment & Plan Note (Deleted)
Resolved Call if symptoms return

## 2015-09-21 NOTE — Progress Notes (Signed)
Subjective:    Patient ID: Derrick Kelley, male    DOB: February 19, 1960, 55 y.o.   MRN: [id number] HPI:  Pt presents to the clinic today for his Welcome to Medicare Exam. He is also due for follow up of chronic conditions.  Depression: Stable on current dose of Zoloft. He denies SI/HI.  GERD: Diet controlled. Not medicated at this time.  HTN: On Avapro and Norvasc.  BP today is 138/72.  ECG today normal. He denies chest pain or dizziness.   HLD: Lipitor recently increased to 20 mg daily. He is also taking Fish Oil daily. His last LDL was 75. He denies myalgias.    Insomnia: Secondary to OSA. Being followed by Pulm. He reports troubles with falling asleep and staying asleep.  He is no longer using the CPAP.  He is taking Ambien at night with some relief.  Chronic back pain: Taking Flexeril and Percocet.  He is no longer taking Suboxone or Gabapentin. He follows with pain management.  Hypogonadism: Injects 100 mg of testosterone weekly.  BPH: He reports symptoms have resolved.  He is no longer taking the Flomax.    Past Medical History:  Diagnosis Date  . Arthritis   . BACK PAIN, CHRONIC 05/11/2009   bone on bone;hx of herniated disc  . Chronic back pain    hx buldging disc;  . DEPRESSION    takes Zoloft daily  . Flushing 05/11/2009  . GERD 05/11/2009   but doesn't require meds  . Headache(784.0) 10/10/2009   cluster;last one about a yr ago  . Hemorrhoids    hx of  . Hypercholesteremia   . HYPERLIPIDEMIA    takes Lipitor daily  . HYPERTENSION    takes Avapro,Amlodipine,and Lisinopril daily  . HYPOGONADISM 05/11/2009   takes Testosterone every 14days  . INSOMNIA-SLEEP DISORDER-UNSPEC 10/10/2009   takes Ambien nightly  . Joint pain   . Joint pain   . POLYCYTHEMIA 05/11/2009  . SCIATICA, RIGHT 09/20/2009  . SINUSITIS- ACUTE-NOS 10/10/2009  . Sleep apnea    use CPAP  . URI 05/11/2009    Current Outpatient Prescriptions  Medication Sig Dispense Refill  . amLODipine  (NORVASC) 10 MG tablet TAKE 1 TABLET BY MOUTH DAILY 30 tablet 2  . atorvastatin (LIPITOR) 20 MG tablet TAKE 1 TABLET BY MOUTH DAILY AT 6PM 30 tablet 2  . cefUROXime (CEFTIN) 250 MG tablet Take 1 tablet (250 mg total) by mouth 2 (two) times daily with a meal. 14 tablet 0  . celecoxib (CELEBREX) 100 MG capsule Take 100 mg by mouth daily. Reported on 07/18/2015    . gabapentin (NEURONTIN) 300 MG capsule Limit 1 capsule by mouth per day or 2 - 3  times per day if tolerated 90 capsule 0  . irbesartan (AVAPRO) 150 MG tablet TAKE 1 TABLET BY MOUTH DAILY 30 tablet 4  . oxycodone (OXY-IR) 5 MG capsule Limit  1 to 3 tablets by mouth daily if tolerated 90 capsule 0  . oxyCODONE-acetaminophen (PERCOCET) 7.5-325 MG tablet Limit 1 tab by mouth 2 - 4  times per day if tolerated 110 tablet 0  . pantoprazole (PROTONIX) 40 MG tablet Take 40 mg by mouth daily.    . sertraline (ZOLOFT) 100 MG tablet Take 2 tablets (200 mg total) by mouth daily. 60 tablet 4  . testosterone cypionate (DEPOTESTOTERONE CYPIONATE) 200 MG/ML injection Inject 1 mL (200 mg total) into the muscle every 14 (fourteen) days. 1 cc biweekly IM (self Injected)  Last injection was on  04/12/11 10 mL 5  . tiZANidine (ZANAFLEX) 4 MG tablet Limited 1 tablet by mouth 2-4 times per day if tolerated 120 tablet 2  . zolpidem (AMBIEN CR) 12.5 MG CR tablet Take 1 tablet (12.5 mg total) by mouth at bedtime. 30 tablet 0   Current Facility-Administered Medications  Medication Dose Route Frequency Provider Last Rate Last Dose  . bupivacaine (PF) (MARCAINE) 0.25 % injection 30 mL  30 mL Other Once Mohammed Kindle, MD      . ceFAZolin (ANCEF) IVPB 1 g/50 mL premix  1 g Intravenous Once Mohammed Kindle, MD      . fentaNYL (SUBLIMAZE) injection 100 mcg  100 mcg Intravenous Once Mohammed Kindle, MD      . lactated ringers infusion 1,000 mL  1,000 mL Intravenous Continuous Mohammed Kindle, MD      . lactated ringers infusion 1,000 mL  1,000 mL Intravenous Continuous Mohammed Kindle, MD      . lactated ringers infusion 1,000 mL  1,000 mL Intravenous Continuous Mohammed Kindle, MD 125 mL/hr at 08/23/15 1244 1,000 mL at 08/23/15 1244  . midazolam (VERSED) 5 MG/5ML injection 5 mg  5 mg Intravenous Once Mohammed Kindle, MD      . orphenadrine (NORFLEX) injection 60 mg  60 mg Intramuscular Once Mohammed Kindle, MD      . orphenadrine (NORFLEX) injection 60 mg  60 mg Intramuscular Once Mohammed Kindle, MD      . triamcinolone acetonide (KENALOG-40) injection 40 mg  40 mg Other Once Mohammed Kindle, MD        Allergies  Allergen Reactions  . Bupropion Hcl     REACTION: nightmares  . Codeine Hives  . Lunesta [Eszopiclone] Other (See Comments)    Worsened depression    Family History  Problem Relation Age of Onset  . Alcohol abuse Father   . Cancer Father     prostate  . Alcohol abuse Maternal Aunt   . Alcohol abuse Maternal Uncle   . Cancer Paternal Uncle     prostate  . Cancer Maternal Grandmother     ovarian cancer  . Alcohol abuse Other   . Anesthesia problems Neg Hx   . Hypotension Neg Hx   . Malignant hyperthermia Neg Hx   . Pseudochol deficiency Neg Hx     Social History   Social History  . Marital status: Married    Spouse name: N/A  . Number of children: 2  . Years of education: N/A   Occupational History  . unemployed [employer]   Social History Main Topics  . Smoking status: Never Smoker  . Smokeless tobacco: Never Used  . Alcohol use 3.0 oz/week    5 Standard drinks or equivalent per week     Comment: occasional  . Drug use: No  . Sexual activity: Yes   Other Topics Concern  . Not on file   Social History Narrative  . No narrative on file    Hospitiliaztions: None  Health Maintenance:    Flu: 09/2013  Tetanus: 09/2013  PSA Screening: 09/2014  Colon Screening: 03/2013 by Dr. Paulita Fujita  Vision Screening: yearly, had appointment today   Dentist: as needed   Providers:   PCP: Webb Silversmith, NP-C  Pain Management: Dr.  Primus Bravo  Gastroenterologist: Dr. Paulita Fujita  Pulmonologist: Dr. Halford Chessman  Urologist: [facility]   I have personally reviewed and have noted:  1. The patient's medical and social history 2. Their use of alcohol, tobacco or illicit drugs 3.  Their current medications and supplements 4. The patient's functional ability including ADL's, fall risks, home  safety risks and hearing or visual impairment. 5. Diet and physical activities 6. Evidence for depression or mood disorder  Subjective:   Review of Systems:   Constitutional: Denies fever, malaise, fatigue, headache or abrupt weight changes.  HEENT: Denies eye pain, eye redness, ear pain, ringing in the ears, wax buildup, runny nose, nasal congestion, bloody nose, or sore throat. Respiratory: Denies difficulty breathing, shortness of breath, cough or sputum production.   Cardiovascular: Denies chest pain, chest tightness, palpitations or swelling in the hands or feet.  Gastrointestinal: Denies abdominal pain, bloating, constipation, diarrhea or blood in the stool.  GU: Denies urgency, frequency, pain with urination, burning sensation, blood in urine, odor or discharge. Musculoskeletal: Pt reports chronic back pain. Denies decrease in range of motion, difficulty with gait,  or joint swelling.  Skin: Denies redness, rashes, lesions or ulcercations.  Neurological: Denies dizziness, difficulty with memory, difficulty with speech or problems with balance and coordination.  Psych: Pt has history of depression. Denies anxiety, SI/HI.  No other specific complaints in a complete review of systems (except as listed in HPI above).  Objective:  PE:   BP 138/72   Pulse 64   Temp 97.8 F (36.6 C) (Oral)   Ht 5\' 8"  (1.727 m)   Wt 186 lb (84.4 kg)   SpO2 97%   BMI 28.28 kg/m  Wt Readings from Last 3 Encounters:  09/21/15 186 lb (84.4 kg)  09/19/15 180 lb (81.6 kg)  08/23/15 180 lb (81.6 kg)    General: Appears his stated age, well  developed, well nourished in NAD. Skin: Warm, dry and intact. He has had a lot of sun exposure. HEENT: Head: normal shape and size; Eyes: sclera white, no icterus, conjunctiva pink; Ears: Tm's gray and intact, normal light reflex; Throat/Mouth: Teeth present, mucosa pink and moist, no exudate, lesions or ulcerations noted.  Cardiovascular: Normal rate and rhythm. S1,S2 noted.  No murmur, rubs or gallops noted. No JVD or BLE edema. No carotid bruits noted. Pulmonary/Chest: Normal effort and positive vesicular breath sounds. No respiratory distress. No wheezes, rales or ronchi noted.  Abdomen: Soft and nontender. Normal bowel sounds. No distention or masses noted. Liver, spleen and kidneys non palpable. Musculoskeletal: Strength 5/5 BUE/BLE. No difficulty with gait.  Neurological: Alert and oriented. Cranial nerves II-XII grossly intact. Coordination normal.  Psychiatric: Mood and affect normal. Behavior is normal. Judgment and thought content normal.    BMET    Component Value Date/Time   NA 138 09/06/2014 1146   K 4.3 09/06/2014 1146   CL 103 09/06/2014 1146   CO2 30 09/06/2014 1146   GLUCOSE 80 09/06/2014 1146   BUN 19 09/06/2014 1146   CREATININE 1.45 09/06/2014 1146   CALCIUM 9.6 09/06/2014 1146   GFRNONAA 78 (L) 01/19/2014 1417   GFRAA >90 01/19/2014 1417    Lipid Panel     Component Value Date/Time   CHOL 143 09/15/2014 0901   TRIG 161.0 (H) 09/15/2014 0901   HDL 36.30 (L) 09/15/2014 0901   CHOLHDL 4 09/15/2014 0901   VLDL 32.2 09/15/2014 0901   LDLCALC 75 09/15/2014 0901    CBC    Component Value Date/Time   WBC 7.4 09/06/2014 1146   RBC 5.52 09/06/2014 1146   HGB 17.2 (H) 09/06/2014 1146   HGB Unable to Determine 01/02/2010 1416   HCT 50.1 09/06/2014 1146   HCT 41.2 01/02/2010 1416  PLT 190.0 09/06/2014 1146   PLT 208 01/02/2010 1416   MCV 90.7 09/06/2014 1146   MCV 84.8 01/02/2010 1416   MCH 32.8 01/28/2014 0500   MCHC 34.3 09/06/2014 1146   RDW 12.9  09/06/2014 1146   RDW 12.9 01/02/2010 1416   LYMPHSABS 0.9 01/19/2014 1417   LYMPHSABS 1.6 01/02/2010 1416   MONOABS 0.7 01/19/2014 1417   MONOABS 0.4 01/02/2010 1416   EOSABS 0.1 01/19/2014 1417   EOSABS 0.1 01/02/2010 1416   BASOSABS 0.0 01/19/2014 1417   BASOSABS 0.0 01/02/2010 1416    Hgb A1C No results found for: HGBA1C    Assessment and Plan:   Medicare Annual Wellness Visit:  Diet: He eats meats, occasional fruits and vegetables, and some fried foods.  He drinks gatorade throughout the day. Physical activity: None Depression/mood screen: Negative Hearing: Intact to whispered voice Visual acuity: Grossly normal, performs annual eye exam  ADLs: Capable Fall risk: None Home safety: Good Cognitive evaluation: Intact to orientation, naming, recall and repetition EOL planning: Adv directives, full code/ I agree  Preventative Medicine: Encouraged him to get a flu shot in the fall. Tetanus UTD. PSA screening UTD. Colonoscopy UTD. Encouraged him to consume a balanced diet and start an exercise regimen. Encouraged him to see an eye doctor and dentist annually. Will check CBC, CMET, Lipid, HIV and Hep C today.   Next appointment: 1 year   Webb Silversmith, NP  HPI  Review of Systems Objective:   Physical Exam  Assessment & Plan:

## 2015-09-21 NOTE — Assessment & Plan Note (Deleted)
Continue testosterone injections as directed

## 2015-09-21 NOTE — Patient Instructions (Signed)
Hypertension Hypertension, commonly called high blood pressure, is when the force of blood pumping through your arteries is too strong. Your arteries are the blood vessels that carry blood from your heart throughout your body. A blood pressure reading consists of a higher number over a lower number, such as 110/72. The higher number (systolic) is the pressure inside your arteries when your heart pumps. The lower number (diastolic) is the pressure inside your arteries when your heart relaxes. Ideally you want your blood pressure below 120/80. Hypertension forces your heart to work harder to pump blood. Your arteries may become narrow or stiff. Having untreated or uncontrolled hypertension can cause heart attack, stroke, kidney disease, and other problems. RISK FACTORS Some risk factors for high blood pressure are controllable. Others are not.  Risk factors you cannot control include:   Race. You may be at higher risk if you are African American.  Age. Risk increases with age.  Gender. Men are at higher risk than women before age 45 years. After age 65, women are at higher risk than men. Risk factors you can control include:  Not getting enough exercise or physical activity.  Being overweight.  Getting too much fat, sugar, calories, or salt in your diet.  Drinking too much alcohol. SIGNS AND SYMPTOMS Hypertension does not usually cause signs or symptoms. Extremely high blood pressure (hypertensive crisis) may cause headache, anxiety, shortness of breath, and nosebleed. DIAGNOSIS To check if you have hypertension, your health care provider will measure your blood pressure while you are seated, with your arm held at the level of your heart. It should be measured at least twice using the same arm. Certain conditions can cause a difference in blood pressure between your right and left arms. A blood pressure reading that is higher than normal on one occasion does not mean that you need treatment. If  it is not clear whether you have high blood pressure, you may be asked to return on a different day to have your blood pressure checked again. Or, you may be asked to monitor your blood pressure at home for 1 or more weeks. TREATMENT Treating high blood pressure includes making lifestyle changes and possibly taking medicine. Living a healthy lifestyle can help lower high blood pressure. You may need to change some of your habits. Lifestyle changes may include:  Following the DASH diet. This diet is high in fruits, vegetables, and whole grains. It is low in salt, red meat, and added sugars.  Keep your sodium intake below 2,300 mg per day.  Getting at least 30-45 minutes of aerobic exercise at least 4 times per week.  Losing weight if necessary.  Not smoking.  Limiting alcoholic beverages.  Learning ways to reduce stress. Your health care provider may prescribe medicine if lifestyle changes are not enough to get your blood pressure under control, and if one of the following is true:  You are 18-59 years of age and your systolic blood pressure is above 140.  You are 60 years of age or older, and your systolic blood pressure is above 150.  Your diastolic blood pressure is above 90.  You have diabetes, and your systolic blood pressure is over 140 or your diastolic blood pressure is over 90.  You have kidney disease and your blood pressure is above 140/90.  You have heart disease and your blood pressure is above 140/90. Your personal target blood pressure may vary depending on your medical conditions, your age, and other factors. HOME CARE INSTRUCTIONS    Have your blood pressure rechecked as directed by your health care provider.   Take medicines only as directed by your health care provider. Follow the directions carefully. Blood pressure medicines must be taken as prescribed. The medicine does not work as well when you skip doses. Skipping doses also puts you at risk for  problems.  Do not smoke.   Monitor your blood pressure at home as directed by your health care provider. SEEK MEDICAL CARE IF:   You think you are having a reaction to medicines taken.  You have recurrent headaches or feel dizzy.  You have swelling in your ankles.  You have trouble with your vision. SEEK IMMEDIATE MEDICAL CARE IF:  You develop a severe headache or confusion.  You have unusual weakness, numbness, or feel faint.  You have severe chest or abdominal pain.  You vomit repeatedly.  You have trouble breathing. MAKE SURE YOU:   Understand these instructions.  Will watch your condition.  Will get help right away if you are not doing well or get worse.   This information is not intended to replace advice given to you by your health care provider. Make sure you discuss any questions you have with your health care provider.   Document Released: 01/28/2005 Document Revised: 06/14/2014 Document Reviewed: 11/20/2012 Elsevier Interactive Patient Education 2016 Elsevier Inc.  

## 2015-09-21 NOTE — Assessment & Plan Note (Deleted)
Continue Lipitor Will check lipid panel today

## 2015-09-22 ENCOUNTER — Encounter: Payer: Self-pay | Admitting: Internal Medicine

## 2015-09-22 ENCOUNTER — Other Ambulatory Visit: Payer: Self-pay | Admitting: Internal Medicine

## 2015-09-22 LAB — HEPATITIS C ANTIBODY: HCV Ab: NEGATIVE

## 2015-09-22 LAB — HIV ANTIBODY (ROUTINE TESTING W REFLEX): HIV 1&2 Ab, 4th Generation: NONREACTIVE

## 2015-09-22 MED ORDER — ZOLPIDEM TARTRATE ER 12.5 MG PO TBCR: 12.5000 mg | EXTENDED_RELEASE_TABLET | Freq: Every day | ORAL | 0 refills | Status: DC

## 2015-09-29 NOTE — Telephone Encounter (Signed)
Pt states he's going on vacation on Thursday to [place] he's requesting refills on all of his meds especially ambien.

## 2015-10-02 ENCOUNTER — Encounter: Payer: Self-pay | Admitting: Pain Medicine

## 2015-10-02 ENCOUNTER — Ambulatory Visit: Payer: BLUE CROSS/BLUE SHIELD | Attending: Pain Medicine | Admitting: Pain Medicine

## 2015-10-02 ENCOUNTER — Telehealth: Payer: Self-pay | Admitting: *Deleted

## 2015-10-02 VITALS — BP 151/93 | HR 58 | Temp 97.4°F | Resp 14 | Ht 68.0 in | Wt 185.0 lb

## 2015-10-02 DIAGNOSIS — M47818 Spondylosis without myelopathy or radiculopathy, sacral and sacrococcygeal region: Secondary | ICD-10-CM

## 2015-10-02 DIAGNOSIS — M19012 Primary osteoarthritis, left shoulder: Secondary | ICD-10-CM

## 2015-10-02 DIAGNOSIS — M5136 Other intervertebral disc degeneration, lumbar region: Secondary | ICD-10-CM | POA: Insufficient documentation

## 2015-10-02 DIAGNOSIS — M545 Low back pain: Secondary | ICD-10-CM | POA: Diagnosis present

## 2015-10-02 DIAGNOSIS — M19011 Primary osteoarthritis, right shoulder: Secondary | ICD-10-CM

## 2015-10-02 DIAGNOSIS — Z981 Arthrodesis status: Secondary | ICD-10-CM | POA: Insufficient documentation

## 2015-10-02 DIAGNOSIS — M461 Sacroiliitis, not elsewhere classified: Secondary | ICD-10-CM

## 2015-10-02 DIAGNOSIS — M961 Postlaminectomy syndrome, not elsewhere classified: Secondary | ICD-10-CM

## 2015-10-02 DIAGNOSIS — M47817 Spondylosis without myelopathy or radiculopathy, lumbosacral region: Secondary | ICD-10-CM | POA: Diagnosis not present

## 2015-10-02 DIAGNOSIS — M47816 Spondylosis without myelopathy or radiculopathy, lumbar region: Secondary | ICD-10-CM

## 2015-10-02 MED ORDER — ORPHENADRINE CITRATE 30 MG/ML IJ SOLN
60.0000 mg | Freq: Once | INTRAMUSCULAR | Status: AC
  Administered 2015-10-02: 60 mg via INTRAMUSCULAR
  Filled 2015-10-02: qty 2

## 2015-10-02 MED ORDER — LIDOCAINE HCL (PF) 1 % IJ SOLN
10.0000 mL | Freq: Once | INTRAMUSCULAR | Status: AC
  Administered 2015-10-02: 10 mL via SUBCUTANEOUS
  Filled 2015-10-02: qty 10

## 2015-10-02 MED ORDER — FENTANYL CITRATE (PF) 100 MCG/2ML IJ SOLN
100.0000 ug | Freq: Once | INTRAMUSCULAR | Status: AC
  Administered 2015-10-02: 100 ug via INTRAVENOUS
  Filled 2015-10-02: qty 2

## 2015-10-02 MED ORDER — MIDAZOLAM HCL 5 MG/5ML IJ SOLN
5.0000 mg | Freq: Once | INTRAMUSCULAR | Status: AC
  Administered 2015-10-02: 5 mg via INTRAVENOUS
  Filled 2015-10-02: qty 5

## 2015-10-02 MED ORDER — BUPIVACAINE HCL (PF) 0.25 % IJ SOLN
30.0000 mL | Freq: Once | INTRAMUSCULAR | Status: AC
  Administered 2015-10-02: 30 mL
  Filled 2015-10-02: qty 30

## 2015-10-02 MED ORDER — CEFAZOLIN IN D5W 1 GM/50ML IV SOLN
1.0000 g | Freq: Once | INTRAVENOUS | Status: AC
  Administered 2015-10-02: 1 g via INTRAVENOUS

## 2015-10-02 MED ORDER — LACTATED RINGERS IV SOLN
1000.0000 mL | INTRAVENOUS | Status: DC
  Administered 2015-10-02: 1000 mL via INTRAVENOUS

## 2015-10-02 MED ORDER — OXYCODONE-ACETAMINOPHEN 7.5-325 MG PO TABS: ORAL_TABLET | ORAL | 0 refills | Status: DC

## 2015-10-02 NOTE — Telephone Encounter (Signed)
Spoke with pharmacy staff, the issue has been resolved.

## 2015-10-02 NOTE — Progress Notes (Signed)
COOLIEF RADIOFREQUENCY RHIZOLYSIS OF THE LUMBAR FACETS                             (MEDIAL BRANCH NERVES)   PROCEDURE PERFORMED: Radiofrequency rhizolysis (lunbar facet medial branch nerve radiofrequency rhizolysis).  The procedure was performed with the COOLIEF technique.  COOLIEF technique was performed using the Synergy cooled radiofrequency probe [id number]-17-150-4, the synergy cooled radiofrequency introducer SII-17-150, and the synergy cooled sterile tube kit [id number].   HISTORY: The patient is a 55 y.o. male who returns to the [place] for further evaluation of severely disabling pain involving the lumbar and lower extremity region.  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   There is concern regarding significant component of patient's pain being due to facet arthropathy with facet syndrome. The patient has had significant relief of pain following previous lumbar facet, medial branch nerve, blocks. Radiofrequency rhizolysis of the lumbar facets, medial branch nerves, will be performed at this time in attempt to provide longer lasting relief of the patient's pain, minimize progression of the patient's symptoms, and avoid the need for more involved treatment. The risks, benefits and expectations of the procedure have been discussed and explained to the patient who was with understanding and wished to proceed with interventional treatment in an attempt to decrease severely  disabling pain of the lumbar and lower extremity region.  We will proceed with what is felt to be a medically necessary procedure, radiofrequency rhizolysis (lumbar facet medial branch nerve radiofrequency rhizolysis) using the COOLIEF technique.  DESCRIPTION OF PROCEDURE: COOLIEF Technique Radiofrequency Rhizolysis of Lumbar  Facets (Medial Branch Nerves Radiofrequency Rhizolysis) with IV Versed, IV Fentanyl conscious sedation, EKG, blood pressure, pulse, capnography, and pulse oximetry monitoring.  The procedure was performed with the patient in the prone position under fluoroscopic guidance.  Following Betadine prep of the proposed entry site, a 1.5% lidocaine plain skin wheal of the proposed needle entry site was  prepared with oblique orientation.   Left, L5 Radiofrequency Rhizolysis L5 Facet  (Medial Branch Nerve): Under fluoroscopic guidance, the radiofrequency needle was inserted at the L5 vertebral body level after a local anesthetic skin wheal of 1.5% lidocaine plain and Betadine prep of the proposed needle entry site was performed.  The needle was inserted under fluoroscopic guidance in the area known as Burton's eye or eye of the Scotty dog with needle placed at the superior and lateral border of the targeted area with oblique orientation utilizing the tunnel (gun  barrel approach) technique to the targeted area.  Left Radiofrequency Rhizolysis at L4 and L3  Lumbar Facets (Medial Branch Nerves) The procedure was performed at L4 and L3 exactly as was performed at the L5 and under fluoroscopic guidance utilizing the identical technique as utilized at the L5 vertebral body level.   Needle was then verified on lateral view and following needle placement verification on lateral view, radiofrequency testing was then carried out with motor testing at 2 Hz stimulation without evidence of stimulation of the lower extremity. Following radiofrequency testing for motor stimulation of the lower extremity, each radiofrequency probe was then prepared with 2 mL of preservative-free lidocaine and following 1 minute of anesthetizing each proposed radiofrequency lesioning  level, the radiofrequency probe was then inserted in the left, L5 vertebral body level  needle with radiofrequency lesioning carried out for 2-1/2 minutes with  temperature of the tissue being maintained at 80 degrees centigrade for 2-1/2 minutes.. The needle and probe were removed.  Radiofrequency probe was then inserted in the left, L4 vertebral body level needle and radiofrequency lesioning was performed at 80 degrees centigrade tissue temperature for 2-1/2 minutes. The needle and probe were removed  Radiofrequency probe was then inserted in the left L3 vertebral body level needle and radiofrequency lesioning was performed at 80 degrees centigrade tissue temperature for 2-1/2 minutes the needle and probe were removed The needle and probe were removed  The patient tolerated the procedure well.   PLAN: 1. Medications: The patient will continue the present medications. Zanaflex and oxycodone acetaminophen 2. The patient will follow up with PCP R Baity regarding blood pressure and general medical condition as discussed with the patient.  3. Surgical evaluation as discussed. 4. Neurological evaluation as discussed.  5. The patient has been advised to call the [facility] Management Center prior to scheduled return appointment should there be significant change in the patient's condition or should the patient have other concerns regarding condition prior to scheduled return appointment.  The patient is with understanding and is in agreement with suggested treatment plan.

## 2015-10-02 NOTE — Telephone Encounter (Signed)
Patient needs nurse to call pharmacy at [facility] to allow Derrick Kelley to fill scripts / called pharmacy and let them know. kw

## 2015-10-02 NOTE — Patient Instructions (Addendum)
PLAN   Continue present medications Zanaflex  and Percocet. Please obtain Ceftin antibiotic today and begin taking Ceftin antibiotic as prescribed    F/U PCP R Baity for evaluation of  BP and general medical condition  F/U with hand surgeon status post surgery of hand . Surgery performed by Dr. Grandville Silos  F/U surgical evaluation. Patient will follow-up with Dr. Tamera Punt for evaluation of shoulder . Patient will follow-up with Dr. Samule Dry for further evaluation of pain of neck and upper extremities and lumbar and lower extremities as discussed  F/U neurological evaluation as discussed.  PNCV/EMG studies as plannedTo evaluate for carpal tone syndrome, cervical radiculopathy, and other abnormalities  May consider additional radiofrequency rhizolysis or intraspinal procedures pending response to present treatment and F/U evaluation .   Patient to call Pain Management Center should patient have concerns prior to scheduled return appointment  Pain Management Discharge Instructions  General Discharge Instructions :  If you need to reach your doctor call: Monday-Friday 8:00 am - 4:00 pm at [phone] or toll free [phone].  After clinic hours [phone] to have operator reach doctor.  Bring all of your medication bottles to all your appointments in the pain clinic.  To cancel or reschedule your appointment with Pain Management please remember to call 24 hours in advance to avoid a fee.  Refer to the educational materials which you have been given on: General Risks, I had my Procedure. Discharge Instructions, Post Sedation.  Post Procedure Instructions:  The drugs you were given will stay in your system until tomorrow, so for the next 24 hours you should not drive, make any legal decisions or drink any alcoholic beverages.  You may eat anything you prefer, but it is better to start with liquids then soups and crackers, and gradually work up to solid foods.  Please notify your  doctor immediately if you have any unusual bleeding, trouble breathing or pain that is not related to your normal pain.  Depending on the type of procedure that was done, some parts of your body may feel week and/or numb.  This usually clears up by tonight or the next day.  Walk with the use of an assistive device or accompanied by an adult for the 24 hours.  You may use ice on the affected area for the first 24 hours.  Put ice in a Ziploc bag and cover with a towel and place against area 15 minutes on 15 minutes off.  You may switch to heat after 24 hours.  Radiofrequency Lesioning Radiofrequency lesioning is a procedure that is performed to relieve pain. The procedure is often used for back, neck, or arm pain. Radiofrequency lesioning involves the use of a machine that creates radio waves to make heat. During the procedure, the heat is applied to the nerve that carries the pain signal. The heat damages the nerve and interferes with the pain signal. Pain relief usually lasts for 6 months to 1 year. LET [facility] CARE PROVIDER KNOW ABOUT:  Any allergies you have.  All medicines you are taking, including vitamins, herbs, eye drops, creams, and over-the-counter medicines.  Previous problems you or members of your family have had with the use of anesthetics.  Any blood disorders you have.  Previous surgeries you have had.  Any medical conditions you have.  Whether you are pregnant or may be pregnant. RISKS AND COMPLICATIONS Generally, this is a safe procedure. However, problems may occur, including:  Pain or soreness at the injection site.  Infection at  the injection site.  Damage to nerves or blood vessels. BEFORE THE PROCEDURE  Ask your health care provider about:  Changing or stopping your regular medicines. This is especially important if you are taking diabetes medicines or blood thinners.  Taking medicines such as aspirin and ibuprofen. These medicines can thin your blood.  Do not take these medicines before your procedure if your health care provider instructs you not to.  Follow instructions from your health care provider about eating or drinking restrictions.  Plan to have someone take you home after the procedure.  If you go home right after the procedure, plan to have someone with you for 24 hours. PROCEDURE  You will be given one or more of the following:  A medicine to help you relax (sedative).  A medicine to numb the area (local anesthetic).  You will be awake during the procedure. You will need to be able to talk with the health care provider during the procedure.  With the help of a type of X-ray (fluoroscopy), the health care provider will insert a radiofrequency needle into the area to be treated.  Next, a wire that carries the radio waves (electrode) will be put through the radiofrequency needle. An electrical pulse will be sent through the electrode to verify the correct nerve. You will feel a tingling sensation, and you may have muscle twitching.  Then, the tissue that is around the needle tip will be heated by an electric current that is passed using the radiofrequency machine. This will numb the nerves.  A bandage (dressing) will be put on the insertion area after the procedure is done. The procedure may vary among health care providers and hospitals. AFTER THE PROCEDURE  Your blood pressure, heart rate, breathing rate, and blood oxygen level will be monitored often until the medicines you were given have worn off.  Return to your normal activities as directed by your health care provider.   This information is not intended to replace advice given to you by your health care provider. Make sure you discuss any questions you have with your health care provider.   Document Released: 09/26/2010 Document Revised: 10/19/2014 Document Reviewed: 03/07/2014 Elsevier Interactive Patient Education [employer].

## 2015-10-03 ENCOUNTER — Telehealth: Payer: Self-pay | Admitting: *Deleted

## 2015-10-03 NOTE — Telephone Encounter (Signed)
Left voicemail for patient to call our office if there are questions or concerns re; procedure on yesterday.  

## 2015-10-03 NOTE — Addendum Note (Signed)
Addended by: Lurlean Nanny on: 10/03/2015 01:59 PM   Modules accepted: Orders

## 2015-10-03 NOTE — Telephone Encounter (Signed)
[place] is going out of country---Ambien last filled 09/22/15---ok for early refill due to being out of the country?

## 2015-10-04 MED ORDER — ZOLPIDEM TARTRATE ER 12.5 MG PO TBCR: 12.5000 mg | EXTENDED_RELEASE_TABLET | Freq: Every day | ORAL | 0 refills | Status: DC

## 2015-10-04 MED ORDER — ATORVASTATIN CALCIUM 20 MG PO TABS: 20.0000 mg | ORAL_TABLET | Freq: Every day | ORAL | 2 refills | Status: DC

## 2015-10-04 MED ORDER — AMLODIPINE BESYLATE 10 MG PO TABS
10.0000 mg | ORAL_TABLET | Freq: Every day | ORAL | 2 refills | Status: DC
Start: 2015-10-04 — End: 2016-04-01

## 2015-10-04 MED ORDER — SERTRALINE HCL 100 MG PO TABS: 200.0000 mg | ORAL_TABLET | Freq: Every day | ORAL | 4 refills | Status: DC

## 2015-10-04 MED ORDER — IRBESARTAN 150 MG PO TABS: 150.0000 mg | ORAL_TABLET | Freq: Every day | ORAL | 4 refills | Status: DC

## 2015-10-04 NOTE — Addendum Note (Signed)
Addended by: Lurlean Nanny on: 10/04/2015 09:15 AM   Modules accepted: Orders

## 2015-10-11 ENCOUNTER — Telehealth: Payer: Self-pay | Admitting: *Deleted

## 2015-10-12 NOTE — Progress Notes (Deleted)
Ok, I see it now, how do I fix it?

## 2015-10-12 NOTE — Progress Notes (Deleted)
I don't see the same thing you are seeing. My name and credentials is at the bottom of the note.

## 2015-10-17 NOTE — Assessment & Plan Note (Signed)
Continue Flexeril, Percocet Followup with pain management

## 2015-10-17 NOTE — Assessment & Plan Note (Signed)
Continue testosterone injections as directed

## 2015-10-17 NOTE — Assessment & Plan Note (Signed)
Continue Ambien. 

## 2015-10-17 NOTE — Assessment & Plan Note (Signed)
Resolved Call if symptoms return

## 2015-10-17 NOTE — Assessment & Plan Note (Signed)
Continue Avapro and Norvasc EKG today was normal Will check CBC, CMET today

## 2015-10-17 NOTE — Assessment & Plan Note (Signed)
Controlled with diet Call if symptoms worsen

## 2015-10-17 NOTE — Assessment & Plan Note (Signed)
Continue Zoloft Call if symptoms worsen

## 2015-10-17 NOTE — Assessment & Plan Note (Signed)
Continue Lipitor Will check lipid panel today

## 2015-10-24 ENCOUNTER — Encounter: Payer: Medicare Other | Admitting: Pain Medicine

## 2015-11-16 DIAGNOSIS — M5416 Radiculopathy, lumbar region: Secondary | ICD-10-CM | POA: Diagnosis not present

## 2015-11-16 DIAGNOSIS — M47817 Spondylosis without myelopathy or radiculopathy, lumbosacral region: Secondary | ICD-10-CM | POA: Diagnosis not present

## 2015-11-16 DIAGNOSIS — M533 Sacrococcygeal disorders, not elsewhere classified: Secondary | ICD-10-CM | POA: Diagnosis not present

## 2015-11-16 DIAGNOSIS — M791 Myalgia: Secondary | ICD-10-CM | POA: Diagnosis not present

## 2015-11-19 ENCOUNTER — Encounter: Payer: Self-pay | Admitting: Internal Medicine

## 2015-11-22 MED ORDER — ZOLPIDEM TARTRATE ER 12.5 MG PO TBCR: 12.5000 mg | EXTENDED_RELEASE_TABLET | Freq: Every day | ORAL | 0 refills | Status: DC

## 2015-11-27 ENCOUNTER — Ambulatory Visit: Payer: BLUE CROSS/BLUE SHIELD | Admitting: Internal Medicine

## 2015-11-28 ENCOUNTER — Ambulatory Visit (INDEPENDENT_AMBULATORY_CARE_PROVIDER_SITE_OTHER): Payer: BLUE CROSS/BLUE SHIELD | Admitting: Internal Medicine

## 2015-11-28 ENCOUNTER — Encounter: Payer: Self-pay | Admitting: Internal Medicine

## 2015-11-28 VITALS — BP 142/88 | HR 83 | Temp 97.7°F | Wt 186.5 lb

## 2015-11-28 DIAGNOSIS — Z23 Encounter for immunization: Secondary | ICD-10-CM | POA: Diagnosis not present

## 2015-11-28 DIAGNOSIS — M545 Low back pain: Secondary | ICD-10-CM | POA: Diagnosis not present

## 2015-11-28 DIAGNOSIS — J301 Allergic rhinitis due to pollen: Secondary | ICD-10-CM

## 2015-11-28 DIAGNOSIS — G8929 Other chronic pain: Secondary | ICD-10-CM

## 2015-11-28 MED ORDER — TRAMADOL HCL 50 MG PO TABS: 100.0000 mg | ORAL_TABLET | Freq: Three times a day (TID) | ORAL | 0 refills | Status: DC | PRN

## 2015-11-28 NOTE — Progress Notes (Signed)
HPI  Pt presents to the clinic today with c/o facial pain and pressure, ear fullness and runny nose. He reports this started 3-4 days ago. He is blowing thick, clear mucous out of his nose. He denies decreased hearing, sore throat or cough. He denies fever, chills or body He has tried Claritin and nasal saline with minimal relief. He has a history of seasonal allergies. He has not had sick contacts that he is aware of.  He also wants to discuss his pain management. He reports he does not want to go back to the pain clinic. The injections did not help. The Oxycodone and Percocet just makes him "foggy" but doesn't take the pain away. He reports he is going into debt every time he goes to pain clinic. He would prefer to just go back on the Tramadol and take Ibuprofen or Tylenol with it.  Review of Systems    Past Medical History:  Diagnosis Date  . Arthritis   . BACK PAIN, CHRONIC 05/11/2009   bone on bone;hx of herniated disc  . Chronic back pain    hx buldging disc;  . DEPRESSION    takes Zoloft daily  . Flushing 05/11/2009  . GERD 05/11/2009   but doesn't require meds  . Headache(784.0) 10/10/2009   cluster;last one about a yr ago  . Hemorrhoids    hx of  . Hypercholesteremia   . HYPERLIPIDEMIA    takes Lipitor daily  . HYPERTENSION    takes Avapro,Amlodipine,and Lisinopril daily  . HYPOGONADISM 05/11/2009   takes Testosterone every 14days  . INSOMNIA-SLEEP DISORDER-UNSPEC 10/10/2009   takes Ambien nightly  . Joint pain   . Joint pain   . POLYCYTHEMIA 05/11/2009  . SCIATICA, RIGHT 09/20/2009  . SINUSITIS- ACUTE-NOS 10/10/2009  . Sleep apnea    use CPAP  . URI 05/11/2009    Family History  Problem Relation Age of Onset  . Alcohol abuse Father   . Cancer Father     prostate  . Alcohol abuse Maternal Aunt   . Alcohol abuse Maternal Uncle   . Cancer Paternal Uncle     prostate  . Cancer Maternal Grandmother     ovarian cancer  . Alcohol abuse Other   . Anesthesia problems  Neg Hx   . Hypotension Neg Hx   . Malignant hyperthermia Neg Hx   . Pseudochol deficiency Neg Hx     Social History   Social History  . Marital status: Married    Spouse name: N/A  . Number of children: 2  . Years of education: N/A   Occupational History  . unemployed [employer]   Social History Main Topics  . Smoking status: Never Smoker  . Smokeless tobacco: Never Used  . Alcohol use 3.0 oz/week    5 Standard drinks or equivalent per week     Comment: occasional  . Drug use: No  . Sexual activity: Yes   Other Topics Concern  . Not on file   Social History Narrative  . No narrative on file    Allergies  Allergen Reactions  . Bupropion Hcl     REACTION: nightmares  . Codeine Hives  . Lunesta [Eszopiclone] Other (See Comments)    Worsened depression     Constitutional:  Denies headache, fatigue, fever or abrupt weight changes.  HEENT:  Positive ear fullness and runny nose. Denies eye redness, ear pain, ringing in the ears, wax buildup,  or bloody nose. Respiratory: Denies cough, difficulty breathing or  shortness of breath.  Cardiovascular: Denies chest pain, chest tightness, palpitations or swelling in the hands or feet.  MSK: Pt reports chronic back pain. Denies decrease in range of motion or difficulty with gait.  No other specific complaints in a complete review of systems (except as listed in HPI above).  Objective:   BP (!) 142/88   Pulse 83   Temp 97.7 F (36.5 C) (Oral)   Wt 186 lb 8 oz (84.6 kg)   SpO2 97%   BMI 28.36 kg/m   General: Appears his stated age, well developed, well nourished in NAD. HEENT: Head: normal shape and size, no sinus tenderness noted; Eyes: sclera white, no icterus, conjunctiva pink; Ears: Tm's gray and intact, normal light reflex; Nose: mucosa pink and moist, septum midline; Throat/Mouth: + PND. Teeth present, mucosa pink and moist, no exudate noted, no lesions or ulcerations noted.  Neck:  No adenopathy  noted.  Cardiovascular: Normal rate and rhythm. S1,S2 noted.  No murmur, rubs or gallops noted.  Pulmonary/Chest: Normal effort and positive vesicular breath sounds. No respiratory distress. No wheezes, rales or ronchi noted.  MSK: Normal flexion, extension and rotation of the spine. Pain with palpation of the lumbar spine. No difficulty with gait.    Assessment & Plan:   Allergic Rhinitis  Continue Claritin Flonase 2 sprays each nostril for 3 days and then as needed. 80 mg Depo IM today  Chronic back pain:  I will prescribe him #180 Tramadol per month, no more He should take Tylenol Arthritis every 8 hours  RTC as needed or if symptoms persist. Webb Silversmith, NP

## 2015-11-28 NOTE — Patient Instructions (Signed)

## 2015-11-29 MED ORDER — METHYLPREDNISOLONE ACETATE 80 MG/ML IJ SUSP
80.0000 mg | Freq: Once | INTRAMUSCULAR | Status: AC
  Administered 2015-11-28: 80 mg via INTRAMUSCULAR

## 2015-11-29 NOTE — Addendum Note (Signed)
Addended by: Lurlean Nanny on: 11/29/2015 08:55 AM   Modules accepted: Orders

## 2015-12-17 ENCOUNTER — Other Ambulatory Visit: Payer: Self-pay | Admitting: Pain Medicine

## 2015-12-21 ENCOUNTER — Other Ambulatory Visit: Payer: Self-pay

## 2015-12-21 MED ORDER — ZOLPIDEM TARTRATE ER 12.5 MG PO TBCR: 12.5000 mg | EXTENDED_RELEASE_TABLET | Freq: Every day | ORAL | 0 refills | Status: DC

## 2015-12-21 NOTE — Telephone Encounter (Signed)
Per verbal order--okay to call in to fill on or after 11/11

## 2015-12-25 ENCOUNTER — Other Ambulatory Visit: Payer: Self-pay | Admitting: Pain Medicine

## 2015-12-28 ENCOUNTER — Other Ambulatory Visit: Payer: Self-pay

## 2015-12-28 MED ORDER — TRAMADOL HCL 50 MG PO TABS
100.0000 mg | ORAL_TABLET | Freq: Three times a day (TID) | ORAL | 0 refills | Status: DC | PRN
Start: 2015-12-28 — End: 2016-01-29

## 2015-12-28 NOTE — Telephone Encounter (Signed)
Last filled 11/28/15--please advise

## 2015-12-28 NOTE — Telephone Encounter (Signed)
Ok to phone in Ultram 

## 2015-12-29 NOTE — Telephone Encounter (Signed)
Rx called in to pharmacy. 

## 2016-01-22 ENCOUNTER — Other Ambulatory Visit: Payer: Self-pay | Admitting: Internal Medicine

## 2016-01-22 NOTE — Telephone Encounter (Signed)
Rx called in to pharmacy. 

## 2016-01-22 NOTE — Telephone Encounter (Signed)
Ok to phone in Ambien  *please change class to "phone in" 

## 2016-01-22 NOTE — Telephone Encounter (Signed)
Last filled 12/21/15--please advise

## 2016-01-29 ENCOUNTER — Other Ambulatory Visit: Payer: Self-pay | Admitting: Internal Medicine

## 2016-01-29 NOTE — Telephone Encounter (Signed)
Last filled 12/28/15--please advise

## 2016-01-30 NOTE — Telephone Encounter (Signed)
Rx called in to pharmacy. 

## 2016-01-30 NOTE — Telephone Encounter (Signed)
Ok to phone in Tramadol 

## 2016-02-20 ENCOUNTER — Other Ambulatory Visit: Payer: Self-pay | Admitting: Internal Medicine

## 2016-02-20 NOTE — Telephone Encounter (Signed)
Rx called in to pharmacy. 

## 2016-02-20 NOTE — Telephone Encounter (Signed)
Ok to phone in Ambien 

## 2016-02-20 NOTE — Telephone Encounter (Signed)
Last filled 01/22/16--please advise 

## 2016-03-01 ENCOUNTER — Other Ambulatory Visit: Payer: Self-pay | Admitting: Internal Medicine

## 2016-03-01 NOTE — Telephone Encounter (Signed)
Ok to phone in Tramadol 

## 2016-03-01 NOTE — Telephone Encounter (Signed)
Last filled 01/30/16--please advise 

## 2016-03-01 NOTE — Telephone Encounter (Signed)
Rx called in to pharmacy. 

## 2016-03-22 ENCOUNTER — Other Ambulatory Visit: Payer: Self-pay | Admitting: Internal Medicine

## 2016-03-22 NOTE — Telephone Encounter (Signed)
Ok to phone in Ambien  *please change class to "phone in" 

## 2016-03-22 NOTE — Telephone Encounter (Signed)
Last filled 02/20/16--please advise

## 2016-03-22 NOTE — Telephone Encounter (Signed)
Rx called in to pharmacy. 

## 2016-03-25 ENCOUNTER — Other Ambulatory Visit: Payer: Self-pay | Admitting: Internal Medicine

## 2016-04-01 ENCOUNTER — Other Ambulatory Visit: Payer: Self-pay | Admitting: Internal Medicine

## 2016-04-02 ENCOUNTER — Other Ambulatory Visit: Payer: Self-pay | Admitting: Internal Medicine

## 2016-04-02 NOTE — Telephone Encounter (Signed)
Last filled 03/01/16.Marland KitchenMarland KitchenPer [employer] verbal okay, called in Rx as previously prescribed.

## 2016-04-04 ENCOUNTER — Other Ambulatory Visit: Payer: Self-pay | Admitting: Internal Medicine

## 2016-04-18 ENCOUNTER — Other Ambulatory Visit: Payer: Self-pay | Admitting: Internal Medicine

## 2016-04-18 NOTE — Telephone Encounter (Signed)
Rx called in to pharmacy. 

## 2016-04-18 NOTE — Telephone Encounter (Signed)
Ok to phone in Ambien 

## 2016-04-18 NOTE — Telephone Encounter (Signed)
Last filled 03/22/16--please advise

## 2016-04-22 ENCOUNTER — Other Ambulatory Visit: Payer: Self-pay | Admitting: Internal Medicine

## 2016-04-25 ENCOUNTER — Encounter: Payer: Self-pay | Admitting: Family Medicine

## 2016-04-25 ENCOUNTER — Ambulatory Visit (INDEPENDENT_AMBULATORY_CARE_PROVIDER_SITE_OTHER): Payer: BLUE CROSS/BLUE SHIELD | Admitting: Family Medicine

## 2016-04-25 VITALS — BP 130/80 | HR 76 | Temp 98.4°F | Wt 189.2 lb

## 2016-04-25 DIAGNOSIS — B349 Viral infection, unspecified: Secondary | ICD-10-CM | POA: Insufficient documentation

## 2016-04-25 NOTE — Assessment & Plan Note (Signed)
Anticipate viral illness with transient mild cervical lymphadenopathy. Supportive care reviewed. Discussed update [place] if ongoing LAD past 3-4 wks for further evaluation. Pt agrees with plan.

## 2016-04-25 NOTE — Progress Notes (Signed)
Pre visit review using our clinic review tool, if applicable. No additional management support is needed unless otherwise documented below in the visit note. 

## 2016-04-25 NOTE — Patient Instructions (Addendum)
I think you have viral infection leading to malaise and sore swollen glands in neck  Push fluids and rest. Take ibuprofen 600mg  with meals for next few days.  Let us know if ongoing swollen glands past 4 weeks for further evaluation.

## 2016-04-25 NOTE — Progress Notes (Signed)
BP 130/80   Pulse 76   Temp 98.4 F (36.9 C) (Oral)   Wt 189 lb 4 oz (85.8 kg)   BMI 28.78 kg/m    CC: LAD Subjective:    Patient ID: Derrick Kelley, male    DOB: May 08, 1960, 56 y.o.   MRN: [id number]  HPI: Derrick Kelley is a 56 y.o. male presenting on 04/25/2016 for Adenopathy (no ST)   1 wk h/o swollen glands in neck. This is associated with malaise and lightheadedness. Mild head congestion and jaw pain, headache. Chronic body aches.   No fevers/chills, ST, cough, ear pain, PNDrainage. No dyspnea or wheezing. No unexpected weight loss. Appetite ok.  Non smoker. No smokeless tobacco. No alcohol.   He took some left over doxycycline one a day for the last 6 days. No significant improvement.   Relevant past medical, surgical, family and social history reviewed and updated as indicated. Interim medical history since our last visit reviewed. Allergies and medications reviewed and updated. Outpatient Medications Prior to Visit  Medication Sig Dispense Refill  . amLODipine (NORVASC) 10 MG tablet TAKE 1 TABLET BY MOUTH EVERY DAY 30 tablet 1  . atorvastatin (LIPITOR) 20 MG tablet TAKE 1 TABLET BY MOUTH EVERY DAY AT 6PM 30 tablet 1  . irbesartan (AVAPRO) 150 MG tablet TAKE 1 TABLET BY MOUTH DAILY 30 tablet 1  . sertraline (ZOLOFT) 100 MG tablet Take 2 tablets (200 mg total) by mouth daily. 60 tablet 4  . testosterone cypionate (DEPOTESTOTERONE CYPIONATE) 200 MG/ML injection Inject 1 mL (200 mg total) into the muscle every 14 (fourteen) days. 1 cc biweekly IM (self Injected)  Last injection was on 04/12/11 10 mL 5  . traMADol (ULTRAM) 50 MG tablet TAKE 2 TABLETS BY MOUTH AS NEEDED EVERY EIGHT HOURS AS NEEDED. 180 tablet 0  . zolpidem (AMBIEN CR) 12.5 MG CR tablet TAKE ONE TABLET BY MOUTH AT BEDTIME AS NEEDED 30 tablet 0  . irbesartan (AVAPRO) 150 MG tablet Take 1 tablet (150 mg total) by mouth daily. 30 tablet 4  . lactated ringers infusion 1,000 mL      No facility-administered  medications prior to visit.      Per HPI unless specifically indicated in ROS section below Review of Systems     Objective:    BP 130/80   Pulse 76   Temp 98.4 F (36.9 C) (Oral)   Wt 189 lb 4 oz (85.8 kg)   BMI 28.78 kg/m   Wt Readings from Last 3 Encounters:  04/25/16 189 lb 4 oz (85.8 kg)  11/28/15 186 lb 8 oz (84.6 kg)  10/02/15 185 lb (83.9 kg)    Physical Exam  Constitutional: He appears well-developed and well-nourished. No distress.  HENT:  Head: Normocephalic and atraumatic.  Right Ear: Hearing, tympanic membrane, external ear and ear canal normal.  Left Ear: Hearing, tympanic membrane, external ear and ear canal normal.  Nose: Mucosal edema present. No rhinorrhea. Right sinus exhibits no maxillary sinus tenderness and no frontal sinus tenderness. Left sinus exhibits no maxillary sinus tenderness and no frontal sinus tenderness.  Mouth/Throat: Uvula is midline and mucous membranes are normal. Posterior oropharyngeal erythema present. No oropharyngeal exudate, posterior oropharyngeal edema or tonsillar abscesses.  Eyes: Conjunctivae and EOM are normal. Pupils are equal, round, and reactive to light. No scleral icterus.  Neck: Normal range of motion. Neck supple. No thyromegaly present.  Cardiovascular: Normal rate, regular rhythm, normal heart sounds and intact distal pulses.   No murmur  heard. Pulmonary/Chest: Effort normal and breath sounds normal. No respiratory distress. He has no wheezes. He has no rales.  Lymphadenopathy:    He has cervical adenopathy (shotty tender AC and submandibular LAD).  Skin: Skin is warm and dry. No rash noted.  Nursing note and vitals reviewed.     Assessment & Plan:   Problem List Items Addressed This Visit    Viral illness - Primary    Anticipate viral illness with transient mild cervical lymphadenopathy. Supportive care reviewed. Discussed update [place] if ongoing LAD past 3-4 wks for further evaluation. Pt agrees with plan.            Follow up plan: Return if symptoms worsen or fail to improve.  Ria Bush, MD

## 2016-05-16 ENCOUNTER — Other Ambulatory Visit: Payer: Self-pay | Admitting: Internal Medicine

## 2016-05-17 NOTE — Telephone Encounter (Signed)
Last Rx 04/02/16 and 04/18/16. Last OV 04/2016-acute

## 2016-05-17 NOTE — Telephone Encounter (Signed)
Ok to phone in Ambien and Tramadol

## 2016-05-17 NOTE — Telephone Encounter (Signed)
Rx called in to requested pharmacy 

## 2016-05-31 DIAGNOSIS — M67912 Unspecified disorder of synovium and tendon, left shoulder: Secondary | ICD-10-CM | POA: Diagnosis not present

## 2016-05-31 DIAGNOSIS — M65351 Trigger finger, right little finger: Secondary | ICD-10-CM | POA: Diagnosis not present

## 2016-06-06 ENCOUNTER — Ambulatory Visit (INDEPENDENT_AMBULATORY_CARE_PROVIDER_SITE_OTHER): Payer: BLUE CROSS/BLUE SHIELD | Admitting: Internal Medicine

## 2016-06-06 ENCOUNTER — Other Ambulatory Visit: Payer: Self-pay | Admitting: Internal Medicine

## 2016-06-06 ENCOUNTER — Encounter: Payer: Self-pay | Admitting: Internal Medicine

## 2016-06-06 VITALS — BP 134/82 | HR 76 | Temp 97.9°F | Wt 189.0 lb

## 2016-06-06 DIAGNOSIS — M545 Low back pain: Secondary | ICD-10-CM

## 2016-06-06 DIAGNOSIS — R0982 Postnasal drip: Secondary | ICD-10-CM | POA: Diagnosis not present

## 2016-06-06 DIAGNOSIS — G8929 Other chronic pain: Secondary | ICD-10-CM

## 2016-06-06 DIAGNOSIS — R590 Localized enlarged lymph nodes: Secondary | ICD-10-CM | POA: Diagnosis not present

## 2016-06-06 MED ORDER — OXYCODONE-ACETAMINOPHEN 7.5-325 MG PO TABS: 1.0000 | ORAL_TABLET | Freq: Three times a day (TID) | ORAL | 0 refills | Status: DC | PRN

## 2016-06-06 NOTE — Progress Notes (Signed)
Subjective:    Patient ID: Derrick Kelley, male    DOB: Jul 04, 1960, 56 y.o.   MRN: [id number]  HPI  Pt presents to the clinic today with c/o persistent swollen lymph nodes in his neck. He was seen 3/18 with the same complaint. At that time, he had general malaise and mild lightheadedness. He treated himself with leftover Doxycycline without any improvement. Dr. Danise Mina felt that this was viral in nature, and would resolve with time. He reports he has not noticed any improvement in the swollen glands. He does have associated post nasal drip. He occasionally takes Zyrtec when he remembers. He denies fever, chills or body aches. He has not noticed any other swollen lymph nodes.  He is also requesting a referral to pain clinic for his low back pain. He has been more active lately and his back pain has been worse. He has been taking Tylenol Arthritis and Tramadol without much relief.   Review of Systems      Past Medical History:  Diagnosis Date  . Arthritis   . BACK PAIN, CHRONIC 05/11/2009   bone on bone;hx of herniated disc  . Chronic back pain    hx buldging disc;  . DEPRESSION    takes Zoloft daily  . Flushing 05/11/2009  . GERD 05/11/2009   but doesn't require meds  . Headache(784.0) 10/10/2009   cluster;last one about a yr ago  . Hemorrhoids    hx of  . Hypercholesteremia   . HYPERLIPIDEMIA    takes Lipitor daily  . HYPERTENSION    takes Avapro,Amlodipine,and Lisinopril daily  . HYPOGONADISM 05/11/2009   takes Testosterone every 14days  . INSOMNIA-SLEEP DISORDER-UNSPEC 10/10/2009   takes Ambien nightly  . Joint pain   . Joint pain   . POLYCYTHEMIA 05/11/2009  . SCIATICA, RIGHT 09/20/2009  . SINUSITIS- ACUTE-NOS 10/10/2009  . Sleep apnea    use CPAP  . URI 05/11/2009    Current Outpatient Prescriptions  Medication Sig Dispense Refill  . amLODipine (NORVASC) 10 MG tablet TAKE 1 TABLET BY MOUTH EVERY DAY 30 tablet 1  . atorvastatin (LIPITOR) 20 MG tablet TAKE 1 TABLET  BY MOUTH EVERY DAY AT 6PM 30 tablet 1  . irbesartan (AVAPRO) 150 MG tablet TAKE 1 TABLET BY MOUTH DAILY 30 tablet 1  . sertraline (ZOLOFT) 100 MG tablet Take 2 tablets (200 mg total) by mouth daily. 60 tablet 4  . testosterone cypionate (DEPOTESTOTERONE CYPIONATE) 200 MG/ML injection Inject 1 mL (200 mg total) into the muscle every 14 (fourteen) days. 1 cc biweekly IM (self Injected)  Last injection was on 04/12/11 10 mL 5  . traMADol (ULTRAM) 50 MG tablet TAKE 2 TABLETS BY MOUTH AS NEEDED EVERY EIGHT HOURS AS NEEDED. 180 tablet 0  . zolpidem (AMBIEN CR) 12.5 MG CR tablet TAKE ONE TABLET BY MOUTH EVERY NIGHT AT BEDTIME 30 tablet 0   No current facility-administered medications for this visit.     Allergies  Allergen Reactions  . Bupropion Hcl     REACTION: nightmares  . Codeine Hives  . Lunesta [Eszopiclone] Other (See Comments)    Worsened depression    Family History  Problem Relation Age of Onset  . Alcohol abuse Father   . Cancer Father     prostate  . Alcohol abuse Maternal Aunt   . Alcohol abuse Maternal Uncle   . Cancer Paternal Uncle     prostate  . Cancer Maternal Grandmother     ovarian cancer  . Alcohol  abuse Other   . Anesthesia problems Neg Hx   . Hypotension Neg Hx   . Malignant hyperthermia Neg Hx   . Pseudochol deficiency Neg Hx     Social History   Social History  . Marital status: Married    Spouse name: N/A  . Number of children: 2  . Years of education: N/A   Occupational History  . unemployed [employer]   Social History Main Topics  . Smoking status: Never Smoker  . Smokeless tobacco: Never Used  . Alcohol use 3.0 oz/week    5 Standard drinks or equivalent per week     Comment: occasional  . Drug use: No  . Sexual activity: Yes   Other Topics Concern  . Not on file   Social History Narrative  . No narrative on file     Constitutional: Denies fever, malaise, fatigue, headache or abrupt weight changes.  HEENT: Pt reports  swollen glands and post nasal drip. Denies eye pain, eye redness, ear pain, ringing in the ears, wax buildup, runny nose, nasal congestion, bloody nose, or sore throat. Respiratory: Denies difficulty breathing, shortness of breath, cough or sputum production.   Musculoskeletal: Pt reports low back pain. Denies difficulty with gait, muscle pain or joint swelling.    No other specific complaints in a complete review of systems (except as listed in HPI above).  Objective:   Physical Exam   BP 134/82   Pulse 76   Temp 97.9 F (36.6 C) (Oral)   Wt 189 lb (85.7 kg)   SpO2 97%   BMI 28.74 kg/m  Wt Readings from Last 3 Encounters:  06/06/16 189 lb (85.7 kg)  04/25/16 189 lb 4 oz (85.8 kg)  11/28/15 186 lb 8 oz (84.6 kg)    General: Appears his stated age, in NAD. HEENT: Head: normal shape and size; Ears: Tm's gray and intact, normal light reflex; Throat/Mouth: Teeth present, mucosa pink and moist, + PND, no exudate, lesions or ulcerations noted.  Neck:  Left anterior cervical adenopathy.  Cardiovascular: Normal rate and rhythm.  Pulmonary/Chest: Normal effort and positive vesicular breath sounds. No respiratory distress. No wheezes, rales or ronchi noted.  Musculoskeletal: Tenderness noted over the lumbar spine.  BMET    Component Value Date/Time   NA 138 09/21/2015 1514   K 4.3 09/21/2015 1514   CL 101 09/21/2015 1514   CO2 32 09/21/2015 1514   GLUCOSE 102 (H) 09/21/2015 1514   BUN 24 (H) 09/21/2015 1514   CREATININE 1.39 09/21/2015 1514   CALCIUM 9.5 09/21/2015 1514   GFRNONAA 78 (L) 01/19/2014 1417   GFRAA >90 01/19/2014 1417    Lipid Panel     Component Value Date/Time   CHOL 155 09/21/2015 1514   TRIG 345.0 (H) 09/21/2015 1514   HDL 39.50 09/21/2015 1514   CHOLHDL 4 09/21/2015 1514   VLDL 69.0 (H) 09/21/2015 1514   LDLCALC 75 09/15/2014 0901    CBC    Component Value Date/Time   WBC 7.7 09/21/2015 1514   RBC 5.22 09/21/2015 1514   HGB 16.3 09/21/2015 1514     HGB Unable to Determine 01/02/2010 1416   HCT 47.1 09/21/2015 1514   HCT 41.2 01/02/2010 1416   PLT 215.0 09/21/2015 1514   PLT 208 01/02/2010 1416   MCV 90.2 09/21/2015 1514   MCV 84.8 01/02/2010 1416   MCH 32.8 01/28/2014 0500   MCHC 34.6 09/21/2015 1514   RDW 13.6 09/21/2015 1514   RDW 12.9 01/02/2010  1416   LYMPHSABS 1.2 09/21/2015 1514   LYMPHSABS 1.6 01/02/2010 1416   MONOABS 0.4 09/21/2015 1514   MONOABS 0.4 01/02/2010 1416   EOSABS 0.2 09/21/2015 1514   EOSABS 0.1 01/02/2010 1416   BASOSABS 0.0 09/21/2015 1514   BASOSABS 0.0 01/02/2010 1416    Hgb A1C No results found for: HGBA1C         Assessment & Plan:   Cervical Adenopathy:  ? r/t PND Take Zyrtec daily Try some Ibuprofen If persist, can get CBC and ultrasound  Low Back Pain:  Referral placed to Dr. Kathyrn Lass for Percocet 7.5-325 mg TID prn NCCSR reviewed  RTC as needed or if symptoms persist or worsen Nasif Bos, NP

## 2016-06-06 NOTE — Patient Instructions (Signed)
Lymphadenopathy °Lymphadenopathy refers to swollen or enlarged lymph glands, also called lymph nodes. Lymph glands are part of your body's defense (immune) system, which protects the body from infections, germs, and diseases. Lymph glands are found in many locations in your body, including the neck, underarm, and groin. °Many things can cause lymph glands to become enlarged. When your immune system responds to germs, such as viruses or bacteria, infection-fighting cells and fluid build up. This causes the glands to grow in size. Usually, this is not something to worry about. The swelling and any soreness often go away without treatment. However, swollen lymph glands can also be caused by a number of diseases. Your health care provider may do various tests to help determine the cause. If the cause of your swollen lymph glands cannot be found, it is important to monitor your condition to make sure the swelling goes away. °Follow these instructions at home: °Watch your condition for any changes. The following actions may help to lessen any discomfort you are feeling: °· Get plenty of rest. °· Take medicines only as directed by your health care provider. Your health care provider may recommend over-the-counter medicines for pain. °· Apply moist heat compresses to the site of swollen lymph nodes as directed by your health care provider. This can help reduce any pain. °· Check your lymph nodes daily for any changes. °· Keep all follow-up visits as directed by your health care provider. This is important. °Contact a health care provider if: °· Your lymph nodes are still swollen after 2 weeks. °· Your swelling increases or spreads to other areas. °· Your lymph nodes are hard, seem fixed to the skin, or are growing rapidly. °· Your skin over the lymph nodes is red and inflamed. °· You have a fever. °· You have chills. °· You have fatigue. °· You develop a sore throat. °· You have abdominal pain. °· You have weight  loss. °· You have night sweats. °Get help right away if: °· You notice fluid leaking from the area of the enlarged lymph node. °· You have severe pain in any area of your body. °· You have chest pain. °· You have shortness of breath. °This information is not intended to replace advice given to you by your health care provider. Make sure you discuss any questions you have with your health care provider. °Document Released: 11/07/2007 Document Revised: 07/06/2015 Document Reviewed: 09/02/2013 °Elsevier Interactive Patient Education © 2017 [employer]vier Inc. ° °

## 2016-06-17 ENCOUNTER — Other Ambulatory Visit: Payer: Self-pay | Admitting: Internal Medicine

## 2016-06-18 NOTE — Telephone Encounter (Signed)
Please advise for Zoloft... Last filled 09/2015

## 2016-06-26 ENCOUNTER — Other Ambulatory Visit: Payer: Self-pay | Admitting: *Deleted

## 2016-06-26 MED ORDER — ZOLPIDEM TARTRATE ER 12.5 MG PO TBCR: 12.5000 mg | EXTENDED_RELEASE_TABLET | Freq: Every day | ORAL | 0 refills | Status: DC

## 2016-06-26 NOTE — Telephone Encounter (Signed)
Ok to phone in Ambien 

## 2016-06-26 NOTE — Telephone Encounter (Signed)
Zolpidem called into [facility] - [place], [place] - [address] Phone: [phone] 

## 2016-06-26 NOTE — Telephone Encounter (Signed)
Last office visit  06/06/2016.  Last refilled 04.06.2018 for #30 with no refills.  Ok to refill?

## 2016-07-02 ENCOUNTER — Other Ambulatory Visit: Payer: Self-pay | Admitting: Internal Medicine

## 2016-07-02 NOTE — Telephone Encounter (Signed)
RX printed and signed and placed in MYD box. When need to know when his appt with Dr. Primus Bravo is.

## 2016-07-02 NOTE — Telephone Encounter (Signed)
Last filled 06/06/16... No appt with Dr Primus Bravo yet... Please advise

## 2016-07-03 ENCOUNTER — Encounter: Payer: Self-pay | Admitting: Internal Medicine

## 2016-07-03 DIAGNOSIS — Z79899 Other long term (current) drug therapy: Secondary | ICD-10-CM | POA: Diagnosis not present

## 2016-07-03 DIAGNOSIS — Z79891 Long term (current) use of opiate analgesic: Secondary | ICD-10-CM | POA: Diagnosis not present

## 2016-07-03 NOTE — Telephone Encounter (Signed)
Pt stated dr crisp office has not called yet

## 2016-07-03 NOTE — Telephone Encounter (Signed)
Pt has not heard from Dr Primus Bravo yet, Shirlean Mylar gave pt the number to call to check on status of appt.... UDS given today

## 2016-07-16 ENCOUNTER — Other Ambulatory Visit: Payer: Self-pay | Admitting: Internal Medicine

## 2016-07-22 DIAGNOSIS — M1812 Unilateral primary osteoarthritis of first carpometacarpal joint, left hand: Secondary | ICD-10-CM | POA: Diagnosis not present

## 2016-07-22 DIAGNOSIS — M65312 Trigger thumb, left thumb: Secondary | ICD-10-CM | POA: Diagnosis not present

## 2016-07-22 DIAGNOSIS — M1811 Unilateral primary osteoarthritis of first carpometacarpal joint, right hand: Secondary | ICD-10-CM | POA: Diagnosis not present

## 2016-07-24 ENCOUNTER — Telehealth: Payer: Self-pay

## 2016-07-24 NOTE — Telephone Encounter (Signed)
Per recent UDS result.Marland KitchenMarland KitchenAmphetamine and Methamphetamine was detected in his system along with prescribed meds.... Pt denies any use of meth or Adderall or similar meds.... Pt states he no longer wanted Percocet from you anyway so he would like to just still get his Rx for Ambien and Tramadol... Please advise... UDS has been placed in your box for response

## 2016-07-24 NOTE — Telephone Encounter (Signed)
He is not getting any controlled substances from me

## 2016-07-25 NOTE — Telephone Encounter (Signed)
I spoke to pt and he is aware as instructed and expressed understanding  

## 2016-07-25 NOTE — Addendum Note (Signed)
Addended by: Lurlean Nanny on: 07/25/2016 02:02 PM   Modules accepted: Orders

## 2016-08-02 ENCOUNTER — Other Ambulatory Visit: Payer: Self-pay

## 2016-08-02 MED ORDER — ATORVASTATIN CALCIUM 20 MG PO TABS: ORAL_TABLET | ORAL | 0 refills | Status: DC

## 2016-08-02 MED ORDER — AMLODIPINE BESYLATE 10 MG PO TABS
10.0000 mg | ORAL_TABLET | Freq: Every day | ORAL | 0 refills | Status: DC
Start: 2016-08-02 — End: 2016-11-24

## 2016-08-17 ENCOUNTER — Other Ambulatory Visit: Payer: Self-pay | Admitting: Internal Medicine

## 2016-08-21 DIAGNOSIS — D485 Neoplasm of uncertain behavior of skin: Secondary | ICD-10-CM | POA: Diagnosis not present

## 2016-08-21 DIAGNOSIS — L814 Other melanin hyperpigmentation: Secondary | ICD-10-CM | POA: Diagnosis not present

## 2016-08-21 DIAGNOSIS — D1801 Hemangioma of skin and subcutaneous tissue: Secondary | ICD-10-CM | POA: Diagnosis not present

## 2016-08-21 DIAGNOSIS — L988 Other specified disorders of the skin and subcutaneous tissue: Secondary | ICD-10-CM | POA: Diagnosis not present

## 2016-08-21 DIAGNOSIS — D225 Melanocytic nevi of trunk: Secondary | ICD-10-CM | POA: Diagnosis not present

## 2016-08-21 DIAGNOSIS — L821 Other seborrheic keratosis: Secondary | ICD-10-CM | POA: Diagnosis not present

## 2016-08-21 DIAGNOSIS — L570 Actinic keratosis: Secondary | ICD-10-CM | POA: Diagnosis not present

## 2016-09-06 ENCOUNTER — Ambulatory Visit: Payer: BLUE CROSS/BLUE SHIELD | Admitting: Internal Medicine

## 2016-09-09 DIAGNOSIS — L820 Inflamed seborrheic keratosis: Secondary | ICD-10-CM | POA: Diagnosis not present

## 2016-09-09 DIAGNOSIS — L821 Other seborrheic keratosis: Secondary | ICD-10-CM | POA: Diagnosis not present

## 2016-09-09 DIAGNOSIS — L570 Actinic keratosis: Secondary | ICD-10-CM | POA: Diagnosis not present

## 2016-09-17 ENCOUNTER — Telehealth: Payer: Self-pay

## 2016-09-17 NOTE — Telephone Encounter (Signed)
Pt has been seeing [facility] specialist; pt having red blotches all over upper body that turns into blisters and then become moles; pt very concerned; [facility] said nothing happening. Pt wants to see different dermatologist ASAP. Pt request cb.

## 2016-09-19 NOTE — Telephone Encounter (Signed)
I have not seen his rash in person. He will need to make an appt for me to evaluate and place referral

## 2016-09-20 ENCOUNTER — Encounter: Payer: Self-pay | Admitting: Family Medicine

## 2016-09-20 ENCOUNTER — Ambulatory Visit (INDEPENDENT_AMBULATORY_CARE_PROVIDER_SITE_OTHER): Payer: BLUE CROSS/BLUE SHIELD | Admitting: Family Medicine

## 2016-09-20 VITALS — BP 118/84 | HR 108 | Temp 98.1°F | Resp 16 | Ht 68.0 in | Wt 185.4 lb

## 2016-09-20 DIAGNOSIS — R21 Rash and other nonspecific skin eruption: Secondary | ICD-10-CM | POA: Diagnosis not present

## 2016-09-20 NOTE — Progress Notes (Signed)
BP 118/84   Pulse (!) 108   Temp 98.1 F (36.7 C) (Oral)   Resp 16   Ht 5\' 8"  (1.727 m)   Wt 185 lb 6.4 oz (84.1 kg)   SpO2 98%   BMI 28.19 kg/m    CC: rash Subjective:    Patient ID: Derrick Kelley, male    DOB: 12/09/1960, 56 y.o.   MRN: [id number]  HPI: Derrick Kelley is a 56 y.o. male presenting on 09/20/2016 for Rash (torsal, back, arms. Had moles removed by Dermatologist in the past benign but patient believes its cancerous. )   Saw [facility] with several lesions tested on left shoulder - start as small growths that turn into blister then burst.  Also eyelid lesion that was biopsied "beginnings of skin cancer". Treated with   Last week noted new lesions throughout torso, arms, legs, back.  No pain or itching. No fevers/chills, nausea, oral lesions.  No new lotions/detergents/soaps or shampoos. No new medicines, no new foods.   Wants referral to new dermatologist for second opinion.   [place]  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 Interim medical history since our last visit reviewed. Allergies and medications reviewed and updated. Outpatient Medications Prior to Visit  Medication Sig Dispense Refill  . amLODipine (NORVASC) 10 MG tablet Take 1 tablet (10 mg total) by mouth daily. 90 tablet 0  . atorvastatin (LIPITOR) 20 MG tablet TAKE 1 TABLET BY MOUTH EVERY DAY AT 6PM 90 tablet 0  . irbesartan (AVAPRO) 150 MG tablet TAKE 1 TABLET BY MOUTH DAILY 30 tablet 2  . sertraline (ZOLOFT) 100 MG tablet TAKE TWO (2) TABLETS BY MOUTH DAILY 60 tablet 2  . testosterone cypionate (DEPOTESTOTERONE CYPIONATE) 200 MG/ML injection Inject 1 mL (200 mg total) into the muscle every 14 (fourteen) days. 1 cc biweekly IM (self Injected)  Last injection was on 04/12/11 10 mL 5   No facility-administered medications prior to visit.       Per HPI unless specifically indicated in ROS section below Review of Systems     Objective:    BP 118/84   Pulse (!) 108   Temp 98.1 F (36.7 C) (Oral)   Resp 16   Ht 5\' 8"  (1.727 m)   Wt 185 lb 6.4 oz (84.1 kg)   SpO2 98%   BMI 28.19 kg/m   Wt Readings from Last 3 Encounters:  09/20/16 185 lb 6.4 oz (84.1 kg)  06/06/16 189 lb (85.7 kg)  04/25/16 189 lb 4 oz (85.8 kg)    Physical Exam  Constitutional: He appears well-developed and well-nourished. No distress.  HENT:  Head:    Mildly scaly hyperpigmented patch medial left eye s/p recent treatment with LN2 therapy.   Skin: Skin is warm and dry. Rash noted.  Blanching patches throughout upper chest and shoulders - telangectasias vs ecchymoses   Nursing note and vitals reviewed.      Assessment & Plan:   Problem List Items Addressed This Visit    Skin rash - Primary    Significant sun exposure history. Anticipate skin aging has led to thinner more fragile skin that is more prone to bruising and that these spots are just that - bruises. Pt desires second opinion derm referral, not satisfied with [facility] - will refer today.  Discussed anticipated treatment of AK by [facility]  with cryotherapy.          Follow up plan: Return if symptoms worsen or fail to improve.  Ria Bush, MD

## 2016-09-20 NOTE — Patient Instructions (Addendum)
Possible easy bruising under the skin due to fragile skin.  Eye lesion may have been actinic keratosis. See Shirlean Mylar on your way out for second opinion dermatology referral.

## 2016-09-20 NOTE — Telephone Encounter (Signed)
Pt called to get status of referral. I made him an appt for Mon 8/13.

## 2016-09-20 NOTE — Assessment & Plan Note (Signed)
Significant sun exposure history. Anticipate skin aging has led to thinner more fragile skin that is more prone to bruising and that these spots are just that - bruises. Pt desires second opinion derm referral, not satisfied with [facility] - will refer today.  Discussed anticipated treatment of AK by [facility] with cryotherapy.

## 2016-09-23 ENCOUNTER — Telehealth: Payer: Self-pay

## 2016-09-23 ENCOUNTER — Ambulatory Visit: Payer: BLUE CROSS/BLUE SHIELD | Admitting: Internal Medicine

## 2016-09-23 DIAGNOSIS — R21 Rash and other nonspecific skin eruption: Secondary | ICD-10-CM

## 2016-09-23 NOTE — Telephone Encounter (Signed)
Referral placed. Thank you

## 2016-09-23 NOTE — Addendum Note (Signed)
Addended by: Ria Bush on: 09/23/2016 11:13 AM   Modules accepted: Orders

## 2016-09-23 NOTE — Telephone Encounter (Signed)
Baity pt you saw on Fri and was needing a Derm referral. I do not see referral, please advise

## 2016-10-09 NOTE — Telephone Encounter (Signed)
Pt called to ck on referral status. He is breaking out on chest and needs to get in asap. He wants first avail appt.

## 2016-10-12 DIAGNOSIS — R21 Rash and other nonspecific skin eruption: Secondary | ICD-10-CM | POA: Diagnosis not present

## 2016-10-24 ENCOUNTER — Other Ambulatory Visit: Payer: Self-pay | Admitting: Internal Medicine

## 2016-11-01 ENCOUNTER — Emergency Department: Payer: BLUE CROSS/BLUE SHIELD

## 2016-11-01 ENCOUNTER — Emergency Department
Admission: EM | Admit: 2016-11-01 | Discharge: 2016-11-01 | Disposition: A | Discharge status: Home / Self Care | Payer: BLUE CROSS/BLUE SHIELD | Attending: Emergency Medicine | Admitting: Emergency Medicine

## 2016-11-01 ENCOUNTER — Encounter: Payer: Self-pay | Admitting: Emergency Medicine

## 2016-11-01 DIAGNOSIS — Z79899 Other long term (current) drug therapy: Secondary | ICD-10-CM | POA: Diagnosis not present

## 2016-11-01 DIAGNOSIS — R0789 Other chest pain: Secondary | ICD-10-CM | POA: Diagnosis not present

## 2016-11-01 DIAGNOSIS — I10 Essential (primary) hypertension: Secondary | ICD-10-CM | POA: Insufficient documentation

## 2016-11-01 DIAGNOSIS — R51 Headache: Secondary | ICD-10-CM | POA: Insufficient documentation

## 2016-11-01 DIAGNOSIS — R519 Headache, unspecified: Secondary | ICD-10-CM

## 2016-11-01 DIAGNOSIS — R079 Chest pain, unspecified: Secondary | ICD-10-CM | POA: Diagnosis not present

## 2016-11-01 LAB — CBC
HCT: 43.9 % (ref 40.0–52.0)
Hemoglobin: 15.6 g/dL (ref 13.0–18.0)
MCH: 30.7 pg (ref 26.0–34.0)
MCHC: 35.4 g/dL (ref 32.0–36.0)
MCV: 86.8 fL (ref 80.0–100.0)
Platelets: 193 10*3/uL (ref 150–440)
RBC: 5.06 MIL/uL (ref 4.40–5.90)
RDW: 13.3 % (ref 11.5–14.5)
WBC: 7.9 10*3/uL (ref 3.8–10.6)

## 2016-11-01 LAB — BASIC METABOLIC PANEL
Anion gap: 7 (ref 5–15)
BUN: 31 mg/dL — AB (ref 6–20)
CHLORIDE: 108 mmol/L (ref 101–111)
CO2: 24 mmol/L (ref 22–32)
CREATININE: 1.24 mg/dL (ref 0.61–1.24)
Calcium: 9.4 mg/dL (ref 8.9–10.3)
GFR calc Af Amer: >60 mL/min (ref >60)
GFR calc non Af Amer: >60 mL/min (ref >60)
Glucose, Bld: 162 mg/dL — ABNORMAL HIGH (ref 65–99)
Potassium: 4 mmol/L (ref 3.5–5.1)
Sodium: 139 mmol/L (ref 135–145)

## 2016-11-01 LAB — TROPONIN I

## 2016-11-01 MED ORDER — BUTALBITAL-APAP-CAFFEINE 50-325-40 MG PO TABS: 1.0000 | ORAL_TABLET | Freq: Four times a day (QID) | ORAL | 0 refills | Status: DC | PRN

## 2016-11-01 MED ORDER — IBUPROFEN 600 MG PO TABS
600.0000 mg | ORAL_TABLET | Freq: Once | ORAL | Status: AC
  Administered 2016-11-01: 600 mg via ORAL
  Filled 2016-11-01: qty 1

## 2016-11-01 MED ORDER — BUTALBITAL-APAP-CAFFEINE 50-325-40 MG PO TABS
2.0000 | ORAL_TABLET | Freq: Once | ORAL | Status: AC
  Administered 2016-11-01: 2 via ORAL
  Filled 2016-11-01: qty 2

## 2016-11-01 NOTE — Discharge Instructions (Addendum)
Fortunately today your blood work, EKG, and your head CT were very reassuring. Please make an appointment to follow-up with your primary care provider this coming week for reevaluation and return to emergency department sooner for any concerns.  It was a pleasure to take care of you today, and thank you for coming to our emergency department.  If you have any questions or concerns before leaving please ask the nurse to grab me and I'm more than happy to go through your aftercare instructions again.  If you were prescribed any opioid pain medication today such as Norco, Vicodin, Percocet, morphine, hydrocodone, or oxycodone please make sure you do not drive when you are taking this medication as it can alter your ability to drive safely.  If you have any concerns once you are home that you are not improving or are in fact getting worse before you can make it to your follow-up appointment, please do not hesitate to call 911 and come back for further evaluation.  Darel Hong, MD  Results for orders placed or performed during the hospital encounter of 76/73/41  Basic metabolic panel  Result Value Ref Range   Sodium 139 135 - 145 mmol/L   Potassium 4.0 3.5 - 5.1 mmol/L   Chloride 108 101 - 111 mmol/L   CO2 24 22 - 32 mmol/L   Glucose, Bld 162 (H) 65 - 99 mg/dL   BUN 31 (H) 6 - 20 mg/dL   Creatinine, Ser 1.24 0.61 - 1.24 mg/dL   Calcium 9.4 8.9 - 10.3 mg/dL   GFR calc non Af Amer >60 >60 mL/min   GFR calc Af Amer >60 >60 mL/min   Anion gap 7 5 - 15  CBC  Result Value Ref Range   WBC 7.9 3.8 - 10.6 K/uL   RBC 5.06 4.40 - 5.90 MIL/uL   Hemoglobin 15.6 13.0 - 18.0 g/dL   HCT 43.9 40.0 - 52.0 %   MCV 86.8 80.0 - 100.0 fL   MCH 30.7 26.0 - 34.0 pg   MCHC 35.4 32.0 - 36.0 g/dL   RDW 13.3 11.5 - 14.5 %   Platelets 193 150 - 440 K/uL  Troponin I  Result Value Ref Range   Troponin I <0.03 <0.03 ng/mL   Dg Chest 2 View  Result Date: 11/01/2016 CLINICAL DATA:  Initial evaluation for acute  chest pain, shortness of breath with exertion for 2 weeks. EXAM: CHEST  2 VIEW COMPARISON:  Prior radiograph from 02/24/2011. FINDINGS: The cardiac and mediastinal silhouettes are stable in size and contour, and remain within normal limits. The lungs are normally inflated. No airspace consolidation, pleural effusion, or pulmonary edema is identified. There is no pneumothorax. No acute osseous abnormality identified. Left shoulder arthroplasty noted. IMPRESSION: No active cardiopulmonary disease. Electronically Signed   By: Jeannine Boga M.D.   On: 11/01/2016 12:51   Ct Head Wo Contrast  Result Date: 11/01/2016 CLINICAL DATA:  Headache with tingling down right arm, vision changes and photosensitivity. EXAM: CT HEAD WITHOUT CONTRAST TECHNIQUE: Contiguous axial images were obtained from the base of the skull through the vertex without intravenous contrast. COMPARISON:  Brain MRI dated 11/23/2009. FINDINGS: Brain: Ventricles are normal in size and configuration. All areas of the brain demonstrate normal gray-white matter attenuation. There is no mass, hemorrhage, edema or other evidence of acute parenchymal abnormality. No extra-axial hemorrhage. Vascular: No hyperdense vessel or unexpected calcification. Skull: Normal. Negative for fracture or focal lesion. Sinuses/Orbits: Visualized upper paranasal sinuses are clear. Visualized upper  periorbital and retro-orbital soft tissues are unremarkable. Other: Mastoid air cells are clear. External auditory canals appear clear. Middle ear cavities appear clear. IMPRESSION: Negative head CT.  No intracranial mass, hemorrhage or edema. Electronically Signed   By: Franki Cabot M.D.   On: 11/01/2016 15:11

## 2016-11-01 NOTE — ED Provider Notes (Signed)
[facility] Emergency Department Provider Note  ____________________________________________   First MD Initiated Contact with Patient 11/01/16 1422     (approximate)  I have reviewed the triage vital signs and the nursing notes.   HISTORY  Chief Complaint Chest Pain    HPI Derrick Kelley is a 56 y.o. male who comes to the emergency department with 2 weeks of insidious onset gradually progressive shortness of breath and atypical chest pain. He is also reporting gradual onset not maximal onset bifrontal throbbing headache similar to previous headaches. He is also had intermittent visual changes including some visual disturbances were watching TV. He has been under a tremendous amount of stress recently because his wife has recently fallen off the wagon and begun drinking again. At the same time that this happened, she purchased a new home on the coast of [place] that was devastated and the recent hurricane.  Wife hurricaine etoh   Past Medical History:  Diagnosis Date  . Arthritis   . BACK PAIN, CHRONIC 05/11/2009   bone on bone;hx of herniated disc  . Chronic back pain    hx buldging disc;  . DEPRESSION    takes Zoloft daily  . Flushing 05/11/2009  . GERD 05/11/2009   but doesn't require meds  . Headache(784.0) 10/10/2009   cluster;last one about a yr ago  . Hemorrhoids    hx of  . Hypercholesteremia   . HYPERLIPIDEMIA    takes Lipitor daily  . HYPERTENSION    takes Avapro,Amlodipine,and Lisinopril daily  . HYPOGONADISM 05/11/2009   takes Testosterone every 14days  . INSOMNIA-SLEEP DISORDER-UNSPEC 10/10/2009   takes Ambien nightly  . Joint pain   . Joint pain   . POLYCYTHEMIA 05/11/2009  . SCIATICA, RIGHT 09/20/2009  . SINUSITIS- ACUTE-NOS 10/10/2009  . Sleep apnea    use CPAP  . URI 05/11/2009    Patient Active Problem List   Diagnosis Date Noted  . Facet syndrome, lumbar ([place]) 09/19/2015  . Lumbosacral facet joint syndrome  ([place]) 07/01/2014  . DDD (degenerative disc disease), lumbar 06/21/2014  . Lumbar post-laminectomy syndrome 06/21/2014  . Degenerative joint disease of sacroiliac joint 06/21/2014  . DJD of shoulder 06/21/2014  . S/P shoulder hemiarthroplasty 01/27/2014  . BPH (benign prostatic hyperplasia) 09/30/2013  . 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 Insomnia 10/10/2009  . SCIATICA, RIGHT 09/20/2009  . Hypogonadism in male 05/11/2009  . HLD (hyperlipidemia) 05/11/2009  . POLYCYTHEMIA 05/11/2009  . Depression 05/11/2009  . Essential hypertension 05/11/2009  . GERD 05/11/2009  . Chronic back pain 05/11/2009    Past Surgical History:  Procedure Laterality Date  . ABDOMINAL EXPOSURE  01/02/2012   Procedure: ABDOMINAL EXPOSURE;  Surgeon: Angelia Mould, MD;  Location: [place];  Service: Vascular;  Laterality: Bilateral;  . ANTERIOR LUMBAR FUSION  01/02/2012   Procedure: ANTERIOR LUMBAR FUSION 1 LEVEL;  Surgeon: Sinclair Ship, MD;  Location: [place];  Service: Orthopedics;  Laterality: Bilateral;  Anterior lumbar interbody fusion, lumbar 4-5 with OP-1 and instrumentation.  Marland Kitchen BACK SURGERY  2013  . CARPAL TUNNEL RELEASE Left 08-11-2015  . EYE SURGERY    . hx back surgury lumbar fusion  2007  . hx eye surgury 2001  many yrs ago   pterygium-both eyes  . hx left shoulder surgury  2002 and August 10, 2009   Dr. Tamera Punt, ortho  . left clavicle fracture    . left shoulder surgery    . MOUTH SURGERY  wisdom teeth extracted  . TOTAL SHOULDER ARTHROPLASTY Left 01/27/2014   Procedure: TOTAL SHOULDER ARTHROPLASTY;  Surgeon: Nita Sells, MD;  Location: [place];  Service: Orthopedics;  Laterality: Left;    Prior to Admission medications   Medication Sig Start Date End Date Taking? Authorizing Provider  amLODipine (NORVASC) 10 MG tablet Take 1 tablet (10 mg total) by mouth daily. 08/02/16   Jearld Fenton, NP  atorvastatin (LIPITOR) 20 MG tablet TAKE 1 TABLET BY MOUTH EVERY  DAY AT 6PM 08/02/16   Jearld Fenton, NP  butalbital-acetaminophen-caffeine (FIORICET, ESGIC) [phone] MG tablet Take 1-2 tablets by mouth every 6 (six) hours as needed for headache. 11/01/16 11/01/17  Darel Hong, MD  irbesartan (AVAPRO) 150 MG tablet TAKE 1 TABLET BY MOUTH DAILY 06/19/16   Jearld Fenton, NP  sertraline (ZOLOFT) 100 MG tablet Take 2 tablets (200 mg total) by mouth daily. MUST SCHEDULE ANNUAL EXAM 10/24/16   Jearld Fenton, NP  testosterone cypionate (DEPOTESTOTERONE CYPIONATE) 200 MG/ML injection Inject 1 mL (200 mg total) into the muscle every 14 (fourteen) days. 1 cc biweekly IM (self Injected)  Last injection was on 04/12/11 01/27/13   Biagio Borg, MD    Allergies Bupropion hcl; Codeine; and Lunesta [eszopiclone]  Family History  Problem Relation Age of Onset  . Alcohol abuse Father   . Cancer Father        prostate  . Alcohol abuse Maternal Aunt   . Alcohol abuse Maternal Uncle   . Cancer Paternal Uncle        prostate  . Cancer Maternal Grandmother        ovarian cancer  . Alcohol abuse Other   . Anesthesia problems Neg Hx   . Hypotension Neg Hx   . Malignant hyperthermia Neg Hx   . Pseudochol deficiency Neg Hx     Social History Social History  Substance Use Topics  . Smoking status: Never Smoker  . Smokeless tobacco: Never Used  . Alcohol use 3.0 oz/week    5 Standard drinks or equivalent per week     Comment: occasional    Review of Systems Constitutional: No fever/chills Eyes: positive visual changes. ENT: No sore throat. Cardiovascular: positive chest pain. Respiratory: positive shortness of breath. Gastrointestinal: No abdominal pain.  No nausea, no vomiting.  No diarrhea.  No constipation. Genitourinary: Negative for dysuria. Musculoskeletal: Negative for back pain. Skin: Negative for rash. Neurological: positive for headache.   ____________________________________________   PHYSICAL EXAM:  VITAL SIGNS: ED Triage Vitals  Enc  Vitals Group     BP 11/01/16 1149 131/82     Pulse Rate 11/01/16 1149 72     Resp 11/01/16 1149 18     Temp 11/01/16 1149 98.5 F (36.9 C)     Temp Source 11/01/16 1149 Oral     SpO2 11/01/16 1149 97 %     Weight 11/01/16 1150 180 lb (81.6 kg)     Height 11/01/16 1150 5\' 8"  (1.727 m)     Head Circumference --      Peak Flow --      Pain Score 11/01/16 1149 2     Pain Loc --      Pain Edu? --      Excl. in [place]? --     Constitutional: alert and oriented 4 well appearing nontoxic no diaphoresis speaks in full sentences Eyes: PERRL EOMI. Head: Atraumatic. Nose: No congestion/rhinnorhea. Mouth/Throat: No trismus Neck: No stridor.   Cardiovascular: Normal rate,  regular rhythm. Grossly normal heart sounds.  Good peripheral circulation. Respiratory: Normal respiratory effort.  No retractions. Lungs CTAB and moving good air Gastrointestinal: soft nontender Musculoskeletal: No lower extremity edema   Neurologic:  Normal speech and language. No gross focal neurologic deficits are appreciated. Skin:  Skin is warm, dry and intact. No rash noted. Psychiatric: somewhat anxious.    ____________________________________________   DIFFERENTIAL includes but not limited to  anxiety reaction, acute coronary syndrome, metabolic derangement, intracerebral hemorrhage, ____________________________________________   LABS (all labs ordered are listed, but only abnormal results are displayed)  Labs Reviewed  BASIC METABOLIC PANEL - Abnormal; Notable for the following:       Result Value   Glucose, Bld 162 (*)    BUN 31 (*)    All other components within normal limits  CBC  TROPONIN I    labs interpreted by meacute ischemia __________________________________________  EKG  ED ECG REPORT I, Darel Hong, the attending physician, personally viewed and interpreted this ECG.  Date: 11/01/2016 EKG Time:  Rate: 76 Rhythm: normal sinus rhythm QRS Axis: normal Intervals: normal ST/T  Wave abnormalities: normal Narrative Interpretation: no evidence of acute ischemia  ____________________________________________  RADIOLOGY   chest x-ray reviewed by me shows no acute disease Head CT reviewed by me reveals no acute disease ____________________________________________   PROCEDURES  Procedure(s) performed: no  Procedures  Critical Care performed: no  Observation: no ____________________________________________   INITIAL IMPRESSION / ASSESSMENT AND PLAN / ED COURSE  Pertinent labs & imaging results that were available during my care of the patient were reviewed by me and considered in my medical decision making (see chart for details).  The patient arrives hemodynamically stableand relatively well-appearing with an unremarkable exam. He has a constellation of symptoms that are more consistent with anxiety and stress reaction than organic disease. I obtained a head CT given his new symptoms, which is fortunately negative for acute disease. We discussed his blood work as well as his imaging studies in great detail and he feels significant relief. He is able to follow up with primary care provider within one week for reevaluation. I will prescribe him a short course of Fioricet for his new headaches. He is discharged home in improved condition.      ____________________________________________   FINAL CLINICAL IMPRESSION(S) / ED DIAGNOSES  Final diagnoses:  Atypical chest pain  Nonintractable headache, unspecified chronicity pattern, unspecified headache type      NEW MEDICATIONS STARTED DURING THIS VISIT:  New Prescriptions   BUTALBITAL-ACETAMINOPHEN-CAFFEINE (FIORICET, ESGIC) 50-325-40 MG TABLET    Take 1-2 tablets by mouth every 6 (six) hours as needed for headache.     Note:  This document was prepared using Dragon voice recognition software and may include unintentional dictation errors.     Darel Hong, MD 11/01/16 [phone]

## 2016-11-01 NOTE — ED Triage Notes (Signed)
Pt c/o chest pain and shortness of breath increasingly worse over the past 2 weeks, states worse with exertion.  Pt states he has been having swelling to lower extremities as well, worse at night. Pt also c/o tingling to right arm that has been going on for several weeks.

## 2016-11-08 ENCOUNTER — Other Ambulatory Visit: Payer: Self-pay | Admitting: Internal Medicine

## 2016-11-08 ENCOUNTER — Ambulatory Visit (INDEPENDENT_AMBULATORY_CARE_PROVIDER_SITE_OTHER): Payer: BLUE CROSS/BLUE SHIELD | Admitting: Internal Medicine

## 2016-11-08 ENCOUNTER — Encounter: Payer: Self-pay | Admitting: Internal Medicine

## 2016-11-08 VITALS — BP 122/86 | HR 66 | Temp 98.0°F | Wt 187.0 lb

## 2016-11-08 DIAGNOSIS — R0789 Other chest pain: Secondary | ICD-10-CM

## 2016-11-08 DIAGNOSIS — E7800 Pure hypercholesterolemia, unspecified: Secondary | ICD-10-CM | POA: Diagnosis not present

## 2016-11-08 DIAGNOSIS — G44229 Chronic tension-type headache, not intractable: Secondary | ICD-10-CM

## 2016-11-08 DIAGNOSIS — G8929 Other chronic pain: Secondary | ICD-10-CM

## 2016-11-08 DIAGNOSIS — F419 Anxiety disorder, unspecified: Secondary | ICD-10-CM

## 2016-11-08 DIAGNOSIS — F439 Reaction to severe stress, unspecified: Secondary | ICD-10-CM

## 2016-11-08 DIAGNOSIS — L247 Irritant contact dermatitis due to plants, except food: Secondary | ICD-10-CM

## 2016-11-08 DIAGNOSIS — M542 Cervicalgia: Secondary | ICD-10-CM | POA: Diagnosis not present

## 2016-11-08 DIAGNOSIS — R0602 Shortness of breath: Secondary | ICD-10-CM | POA: Diagnosis not present

## 2016-11-08 LAB — LIPID PANEL
CHOL/HDL RATIO: 5
Cholesterol: 154 mg/dL (ref 0–200)
HDL: 29.2 mg/dL — ABNORMAL LOW (ref >39.00)
Triglycerides: 493 mg/dL — ABNORMAL HIGH (ref 0.0–149.0)

## 2016-11-08 LAB — LDL CHOLESTEROL, DIRECT: LDL DIRECT: 76 mg/dL

## 2016-11-08 MED ORDER — METHYLPREDNISOLONE ACETATE 80 MG/ML IJ SUSP
80.0000 mg | Freq: Once | INTRAMUSCULAR | Status: AC
  Administered 2016-11-08: 80 mg via INTRAMUSCULAR

## 2016-11-08 MED ORDER — TRIAMCINOLONE ACETONIDE 0.1 % EX CREA: 1.0000 "application " | TOPICAL_CREAM | Freq: Two times a day (BID) | CUTANEOUS | 0 refills | Status: AC

## 2016-11-08 MED ORDER — MELOXICAM 15 MG PO TABS: 15.0000 mg | ORAL_TABLET | Freq: Every day | ORAL | 0 refills | Status: AC

## 2016-11-08 MED ORDER — AMITRIPTYLINE HCL 25 MG PO TABS: 25.0000 mg | ORAL_TABLET | Freq: Every day | ORAL | 2 refills | Status: AC

## 2016-11-08 MED ORDER — SUMATRIPTAN SUCCINATE 25 MG PO TABS: 25.0000 mg | ORAL_TABLET | ORAL | 0 refills | Status: AC | PRN

## 2016-11-08 NOTE — Progress Notes (Signed)
Subjective:    Patient ID: Derrick Kelley, male    DOB: [birth year], 56 y.o.   MRN: [id number]  HPI  Pt presents to the clinic today for ER followup. He went to the ER 9/21 with c/o headache, chest pain and shortness of breath. ECG showed no acute findings. Labs were unremarkable. Chest xray was normal. CT head was normal. He had admitted that he has recently been under a lot of stress. He was given a RX for Fioricet for his headaches and advised to follow up with PCP. Since discharge, he reports he is still very stressed. His wife has started drinking again. [place]. He is currently taking Zoloft 200 mg daily.  His biggest concerns right now are the headaches. He reports they are located on the top of his head. He describes the pain as pressure. He has visual changes and lightheadedness. He is not sure if these are related to stress or the bulging disc in his neck. He takes Meloxicam and Fioricet as prescribed but has not noticed a whole lot of improvement with this.  He also c/o a rash on his left forearm. He noticed this 3 days ago. The rash itches. He noticed this after he was outside pulling weeds. He has not put anything on the rash.  Review of Systems      Past Medical History:  Diagnosis Date  . Arthritis   . BACK PAIN, CHRONIC 05/11/2009   bone on bone;hx of herniated disc  . Chronic back pain    hx buldging disc;  . DEPRESSION    takes Zoloft daily  . Flushing 05/11/2009  . GERD 05/11/2009   but doesn't require meds  . Headache(784.0) 10/10/2009   cluster;last one about a yr ago  . Hemorrhoids    hx of  . Hypercholesteremia   . HYPERLIPIDEMIA    takes Lipitor daily  . HYPERTENSION    takes Avapro,Amlodipine,and Lisinopril daily  . HYPOGONADISM 05/11/2009   takes Testosterone every 14days  . INSOMNIA-SLEEP DISORDER-UNSPEC 10/10/2009   takes Ambien nightly  . Joint pain   . Joint pain   . POLYCYTHEMIA  05/11/2009  . SCIATICA, RIGHT 09/20/2009  . SINUSITIS- ACUTE-NOS 10/10/2009  . Sleep apnea    use CPAP  . URI 05/11/2009    Current Outpatient Prescriptions  Medication Sig Dispense Refill  . amLODipine (NORVASC) 10 MG tablet Take 1 tablet (10 mg total) by mouth daily. 90 tablet 0  . atorvastatin (LIPITOR) 20 MG tablet TAKE 1 TABLET BY MOUTH EVERY DAY AT 6PM 90 tablet 0  . butalbital-acetaminophen-caffeine (FIORICET, ESGIC) 50-325-40 MG tablet Take 1-2 tablets by mouth every 6 (six) hours as needed for headache. 20 tablet 0  . irbesartan (AVAPRO) 150 MG tablet TAKE 1 TABLET BY MOUTH DAILY 30 tablet 2  . sertraline (ZOLOFT) 100 MG tablet Take 2 tablets (200 mg total) by mouth daily. MUST SCHEDULE ANNUAL EXAM 60 tablet 1  . testosterone cypionate (DEPOTESTOTERONE CYPIONATE) 200 MG/ML injection Inject 1 mL (200 mg total) into the muscle every 14 (fourteen) days. 1 cc biweekly IM (self Injected)  Last injection was on 04/12/11 10 mL 5   No current facility-administered medications for this visit.     Allergies  Allergen Reactions  . Bupropion Hcl     REACTION: nightmares  . Codeine Hives  . Lunesta [Eszopiclone] Other (See Comments)    Worsened depression    Family  History  Problem Relation Age of Onset  . Alcohol abuse Father   . Cancer Father        prostate  . Alcohol abuse Maternal Aunt   . Alcohol abuse Maternal Uncle   . Cancer Paternal Uncle        prostate  . Cancer Maternal Grandmother        ovarian cancer  . Alcohol abuse Other   . Anesthesia problems Neg Hx   . Hypotension Neg Hx   . Malignant hyperthermia Neg Hx   . Pseudochol deficiency Neg Hx     Social History   Social History  . Marital status: Married    Spouse name: N/A  . Number of children: 2  . Years of education: N/A   Occupational History  . unemployed [employer]   Social History Main Topics  . Smoking status: Never Smoker  . Smokeless tobacco: Never Used  . Alcohol use 3.0  oz/week    5 Standard drinks or equivalent per week     Comment: occasional  . Drug use: No  . Sexual activity: Yes   Other Topics Concern  . Not on file   Social History Narrative  . No narrative on file     Constitutional: Pt reports headache. Denies fever, malaise, fatigue, or abrupt weight changes.  Respiratory: Denies difficulty breathing, shortness of breath, cough or sputum production.   Cardiovascular: Pt reports chest pressure. Denies chest pain, chest tightness, palpitations or swelling in the hands or feet.  Musculoskeletal: Pt reports chronic neck pain. Denies decrease in range of motion, difficulty with gait, muscle pain or joint swelling.  Skin: Pt reports rash on left forearm. Denies ulcercations.  Neurological: Pt reports lightheadedness. Denies dizziness, difficulty with memory, difficulty with speech or problems with balance and coordination.  Psych: Pt reports stress and anxiety. Denies anxiety, depression, SI/HI.  No other specific complaints in a complete review of systems (except as listed in HPI above).  Objective:   Physical Exam   BP 122/86   Pulse 66   Temp 98 F (36.7 C) (Oral)   Wt 187 lb (84.8 kg)   SpO2 96%   BMI 28.43 kg/m  Wt Readings from Last 3 Encounters:  11/08/16 187 lb (84.8 kg)  11/01/16 180 lb (81.6 kg)  09/20/16 185 lb 6.4 oz (84.1 kg)    General: Appears his stated age, in NAD. Skin: Crusted vesicular lesion in linear pattern noted on left forearm. HEENT: Eyes: sclera white, no icterus, conjunctiva pink, PERRLA and EOMs intact;  Cardiovascular: Normal rate and rhythm. S1,S2 noted.  No murmur, rubs or gallops noted.  Pulmonary/Chest: Normal effort and positive vesicular breath sounds. No respiratory distress. No wheezes, rales or ronchi noted.  Musculoskeletal: Pain with palpation over the cervical spine.  Neurological: Alert and oriented. Coordination normal.  Psychiatric: He is anxious appearing today.  BMET    Component  Value Date/Time   NA 139 11/01/2016 1150   K 4.0 11/01/2016 1150   CL 108 11/01/2016 1150   CO2 24 11/01/2016 1150   GLUCOSE 162 (H) 11/01/2016 1150   BUN 31 (H) 11/01/2016 1150   CREATININE 1.24 11/01/2016 1150   CALCIUM 9.4 11/01/2016 1150   GFRNONAA >60 11/01/2016 1150   GFRAA >60 11/01/2016 1150    Lipid Panel     Component Value Date/Time   CHOL 155 09/21/2015 1514   TRIG 345.0 (H) 09/21/2015 1514   HDL 39.50 09/21/2015 1514   CHOLHDL 4 09/21/2015  1514   VLDL 69.0 (H) 09/21/2015 1514   LDLCALC 75 09/15/2014 0901    CBC    Component Value Date/Time   WBC 7.9 11/01/2016 1150   RBC 5.06 11/01/2016 1150   HGB 15.6 11/01/2016 1150   HGB Unable to Determine 01/02/2010 1416   HCT 43.9 11/01/2016 1150   HCT 41.2 01/02/2010 1416   PLT 193 11/01/2016 1150   PLT 208 01/02/2010 1416   MCV 86.8 11/01/2016 1150   MCV 84.8 01/02/2010 1416   MCH 30.7 11/01/2016 1150   MCHC 35.4 11/01/2016 1150   RDW 13.3 11/01/2016 1150   RDW 12.9 01/02/2010 1416   LYMPHSABS 1.2 09/21/2015 1514   LYMPHSABS 1.6 01/02/2010 1416   MONOABS 0.4 09/21/2015 1514   MONOABS 0.4 01/02/2010 1416   EOSABS 0.2 09/21/2015 1514   EOSABS 0.1 01/02/2010 1416   BASOSABS 0.0 09/21/2015 1514   BASOSABS 0.0 01/02/2010 1416    Hgb A1C No results found for: HGBA1C         Assessment & Plan:   ER Follow Up for Chest Pressure, Shortness of Breath, Headaches:  ER notes, labs and imaging reviewed Seems stress, tension and anxiety related to me Support offered today Continue Zoloft He declines referral for therapy at this time eRx for Amitriptyline 25 mg daily eRx for Imitrex 25 mg daily prn  Contact 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

## 2016-11-08 NOTE — Addendum Note (Signed)
Addended by: Lurlean Nanny on: 11/08/2016 04:08 PM   Modules accepted: Orders

## 2016-11-08 NOTE — Patient Instructions (Signed)

## 2016-11-13 ENCOUNTER — Telehealth: Payer: Self-pay | Admitting: Internal Medicine

## 2016-11-13 NOTE — Telephone Encounter (Signed)
PT returned your call regarding labs. He is requesting a cb.

## 2016-11-14 MED ORDER — FENOFIBRATE 54 MG PO TABS: 54.0000 mg | ORAL_TABLET | Freq: Every day | ORAL | 3 refills | Status: AC

## 2016-11-14 NOTE — Addendum Note (Signed)
Addended by: Lurlean Nanny on: 11/14/2016 05:16 PM   Modules accepted: Orders

## 2016-11-24 ENCOUNTER — Other Ambulatory Visit: Payer: Self-pay | Admitting: Internal Medicine

## 2016-12-24 ENCOUNTER — Other Ambulatory Visit: Payer: Self-pay | Admitting: Internal Medicine

## 2017-01-16 DIAGNOSIS — D126 Benign neoplasm of colon, unspecified: Secondary | ICD-10-CM | POA: Diagnosis not present

## 2017-01-16 DIAGNOSIS — Z86010 Personal history of colonic polyps: Secondary | ICD-10-CM | POA: Diagnosis not present

## 2017-01-22 DIAGNOSIS — D126 Benign neoplasm of colon, unspecified: Secondary | ICD-10-CM | POA: Diagnosis not present

## 2017-01-23 DIAGNOSIS — M545 Low back pain: Secondary | ICD-10-CM | POA: Diagnosis not present

## 2017-02-04 ENCOUNTER — Other Ambulatory Visit: Payer: Self-pay | Admitting: Internal Medicine

## 2017-02-18 ENCOUNTER — Ambulatory Visit: Payer: BLUE CROSS/BLUE SHIELD | Admitting: Internal Medicine

## 2017-02-18 ENCOUNTER — Telehealth: Payer: Self-pay

## 2017-02-18 ENCOUNTER — Encounter: Payer: Medicare Other | Admitting: Internal Medicine

## 2017-02-18 DIAGNOSIS — Z0289 Encounter for other administrative examinations: Secondary | ICD-10-CM

## 2017-02-18 NOTE — Telephone Encounter (Signed)
Copied from [place] [phone]. Topic: Quick Communication - Appointment Cancellation >> Feb 18, 2017  1:39 PM Ether Griffins B wrote: Patient called to cancel appointment scheduled for 02/18/17. Patient has rescheduled their appointment.    Route to department's PEC pool. >> Feb 18, 2017  2:47 PM Helene Shoe, LPN wrote: This call was generated by [facility] on 02/18/17 at 1:39 PM; pt appt was 02/18/17 at 8:15 AM.Do you want to charge late cancellation fee? Pt has already rescheduled.

## 2017-02-18 NOTE — Telephone Encounter (Signed)
Yes, charge NSF

## 2017-02-22 ENCOUNTER — Other Ambulatory Visit: Payer: Self-pay | Admitting: Internal Medicine

## 2017-03-27 ENCOUNTER — Encounter: Payer: Medicare Other | Admitting: Internal Medicine

## 2017-04-04 DIAGNOSIS — M545 Low back pain: Secondary | ICD-10-CM | POA: Diagnosis not present

## 2017-04-25 ENCOUNTER — Other Ambulatory Visit: Payer: Self-pay | Admitting: Internal Medicine

## 2017-04-30 DIAGNOSIS — G8929 Other chronic pain: Secondary | ICD-10-CM | POA: Diagnosis not present

## 2017-04-30 DIAGNOSIS — M5442 Lumbago with sciatica, left side: Secondary | ICD-10-CM | POA: Diagnosis not present

## 2017-05-05 DIAGNOSIS — M5136 Other intervertebral disc degeneration, lumbar region: Secondary | ICD-10-CM | POA: Diagnosis not present

## 2017-05-05 DIAGNOSIS — N529 Male erectile dysfunction, unspecified: Secondary | ICD-10-CM | POA: Diagnosis not present

## 2017-05-05 DIAGNOSIS — Z0000 Encounter for general adult medical examination without abnormal findings: Secondary | ICD-10-CM | POA: Diagnosis not present

## 2017-05-05 DIAGNOSIS — E782 Mixed hyperlipidemia: Secondary | ICD-10-CM | POA: Diagnosis not present

## 2017-05-05 DIAGNOSIS — G4700 Insomnia, unspecified: Secondary | ICD-10-CM | POA: Diagnosis not present

## 2017-05-05 DIAGNOSIS — Z1211 Encounter for screening for malignant neoplasm of colon: Secondary | ICD-10-CM | POA: Diagnosis not present

## 2017-05-05 DIAGNOSIS — M545 Low back pain: Secondary | ICD-10-CM | POA: Diagnosis not present

## 2017-05-06 ENCOUNTER — Other Ambulatory Visit: Payer: Self-pay | Admitting: Internal Medicine

## 2017-05-19 DIAGNOSIS — N529 Male erectile dysfunction, unspecified: Secondary | ICD-10-CM | POA: Diagnosis not present

## 2017-05-19 DIAGNOSIS — E291 Testicular hypofunction: Secondary | ICD-10-CM | POA: Diagnosis not present

## 2017-05-19 DIAGNOSIS — G4700 Insomnia, unspecified: Secondary | ICD-10-CM | POA: Diagnosis not present

## 2017-05-19 DIAGNOSIS — Z125 Encounter for screening for malignant neoplasm of prostate: Secondary | ICD-10-CM | POA: Diagnosis not present

## 2017-05-19 DIAGNOSIS — M5136 Other intervertebral disc degeneration, lumbar region: Secondary | ICD-10-CM | POA: Diagnosis not present

## 2017-05-19 DIAGNOSIS — E782 Mixed hyperlipidemia: Secondary | ICD-10-CM | POA: Diagnosis not present

## 2017-05-19 DIAGNOSIS — I10 Essential (primary) hypertension: Secondary | ICD-10-CM | POA: Diagnosis not present

## 2017-05-23 ENCOUNTER — Other Ambulatory Visit: Payer: Self-pay | Admitting: Internal Medicine

## 2017-09-09 DIAGNOSIS — R7989 Other specified abnormal findings of blood chemistry: Secondary | ICD-10-CM | POA: Diagnosis not present

## 2017-09-09 DIAGNOSIS — I10 Essential (primary) hypertension: Secondary | ICD-10-CM | POA: Diagnosis not present

## 2017-09-09 DIAGNOSIS — E782 Mixed hyperlipidemia: Secondary | ICD-10-CM | POA: Diagnosis not present

## 2017-09-09 DIAGNOSIS — M5136 Other intervertebral disc degeneration, lumbar region: Secondary | ICD-10-CM | POA: Diagnosis not present

## 2017-09-09 DIAGNOSIS — E291 Testicular hypofunction: Secondary | ICD-10-CM | POA: Diagnosis not present

## 2017-10-28 DIAGNOSIS — I10 Essential (primary) hypertension: Secondary | ICD-10-CM | POA: Diagnosis not present

## 2017-10-28 DIAGNOSIS — R5383 Other fatigue: Secondary | ICD-10-CM | POA: Diagnosis not present

## 2017-10-28 DIAGNOSIS — E039 Hypothyroidism, unspecified: Secondary | ICD-10-CM | POA: Diagnosis not present

## 2018-03-31 IMAGING — CR DG CHEST 2V
1 series · 2 of 2 positions shown · non-contrast
Comparison: Prior radiograph from 02/24/2011.

CLINICAL DATA: Initial evaluation for acute chest pain, shortness
of breath with exertion for 2 weeks.

EXAM:
CHEST  2 VIEW

[Series 1: dg chest 2 view · 0.14mm/px · 2 of 2 slices shown]
[im 1/2]
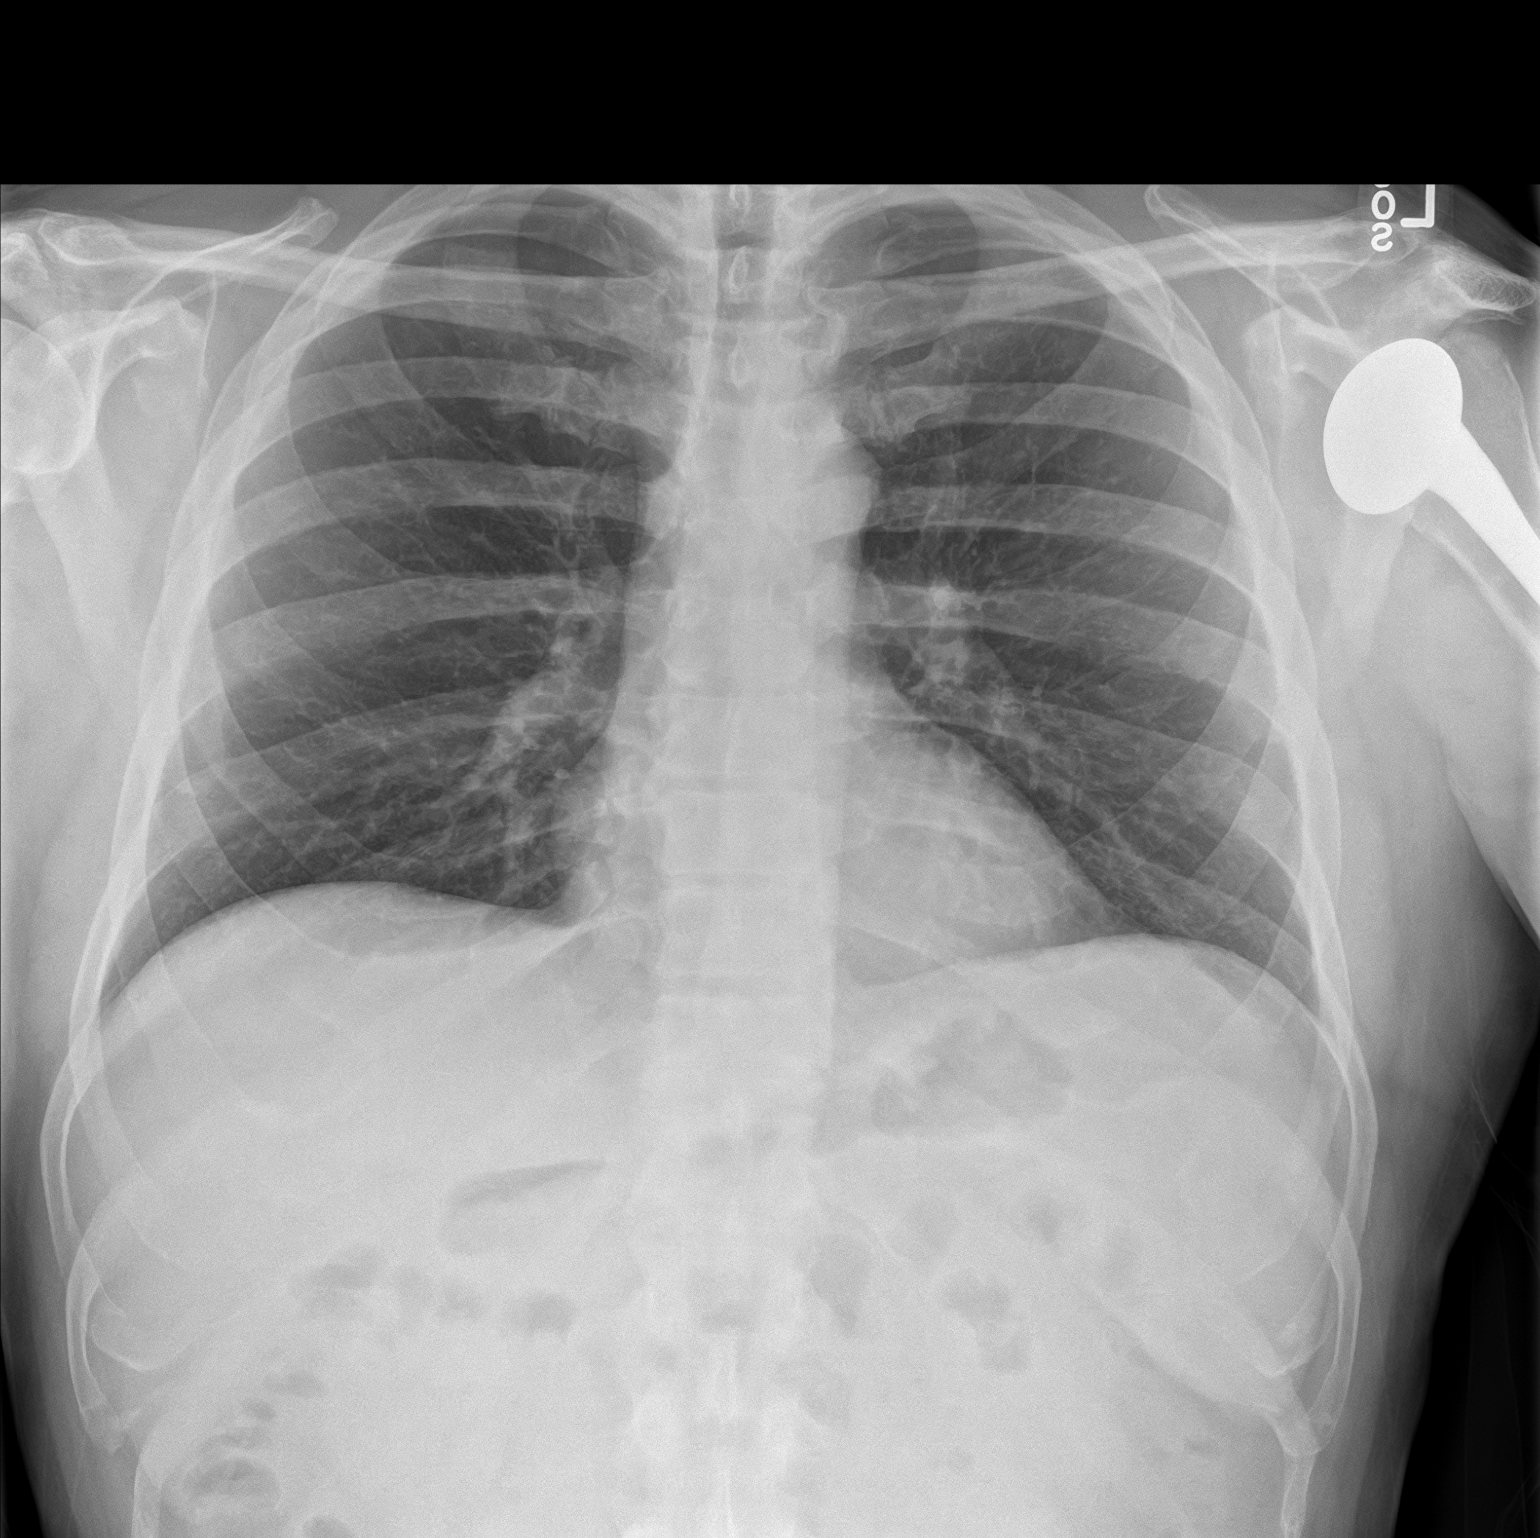
[im 2/2]
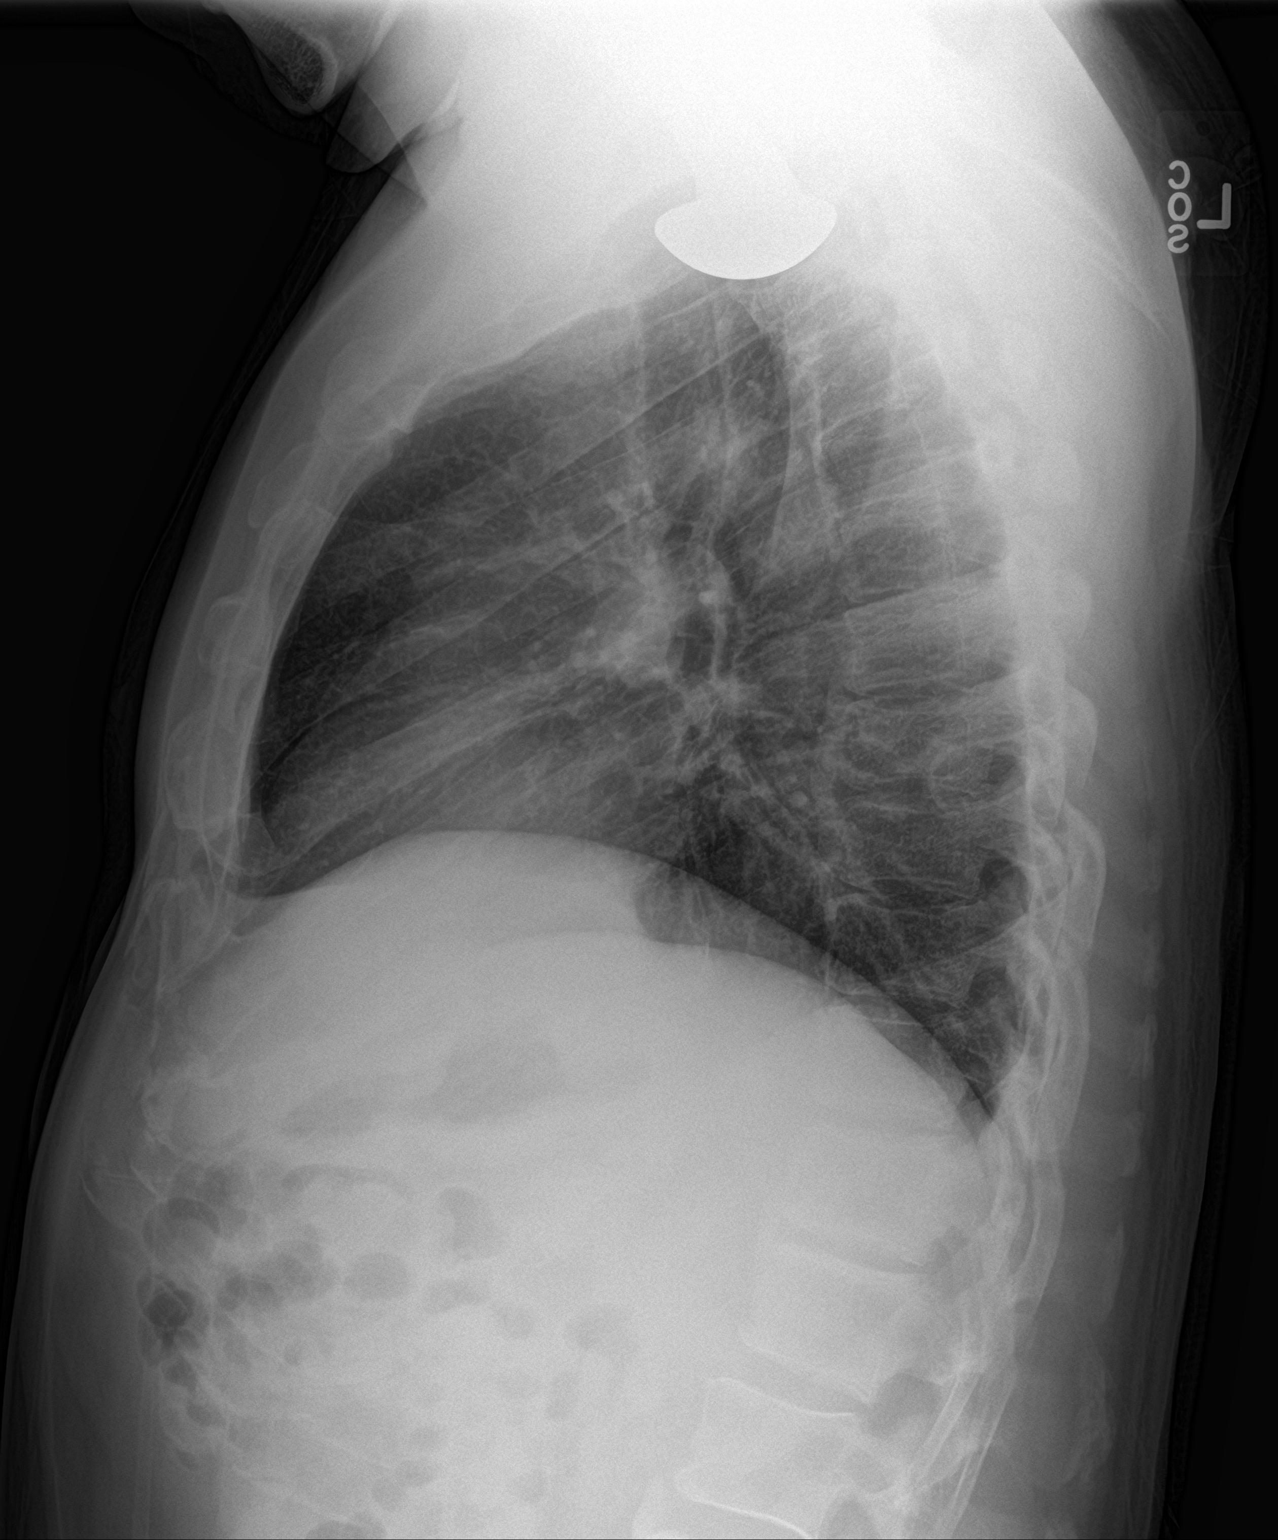

[2 of 2 positions shown; findings below may reference images not displayed]

FINDINGS: The cardiac and mediastinal silhouettes are stable in size and
contour, and remain within normal limits.

The lungs are normally inflated. No airspace consolidation, pleural
effusion, or pulmonary edema is identified. There is no
pneumothorax.

No acute osseous abnormality identified. Left shoulder arthroplasty
noted.
IMPRESSION: No active cardiopulmonary disease.

## 2018-03-31 IMAGING — CT CT HEAD W/O CM
3 of 4 series · 15 of 47 positions shown, 18 images · non-contrast
Comparison: Brain MRI dated 11/23/2009.

CLINICAL DATA: Headache with tingling down right arm, vision
changes and photosensitivity.

EXAM:
CT HEAD WITHOUT CONTRAST
TECHNIQUE: Contiguous axial images were obtained from the base of the skull
through the vertex without intravenous contrast.

[Series 2: head wo · axial · 0.45mm/px · z∈[+584,+709]mm · 9 of 31 slices shown, 12 images]
[im 3/31  brain]
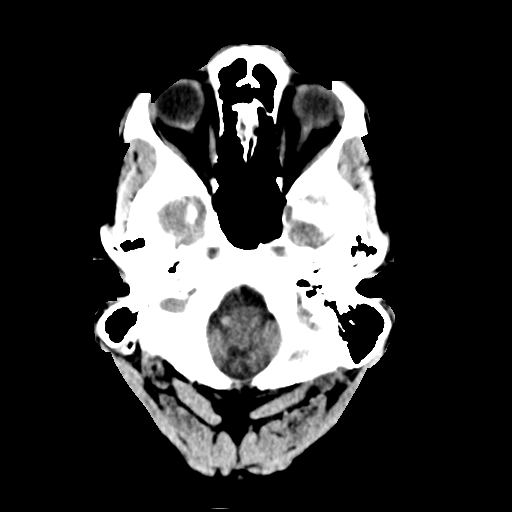
[im 3/31  bone]
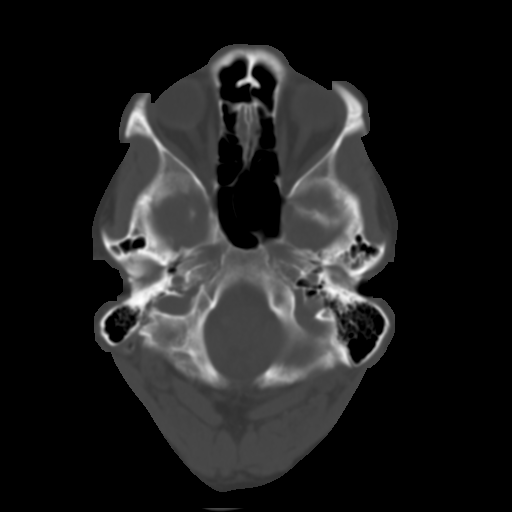
[im 7/31  brain]
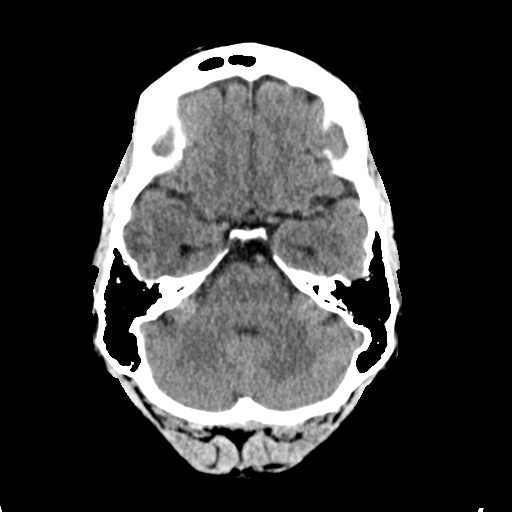
[im 9/31  brain]
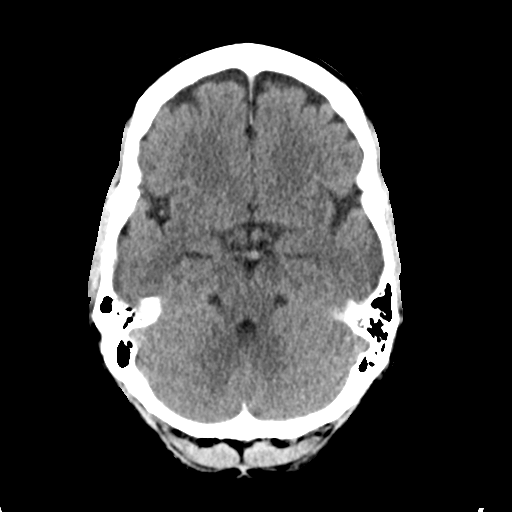
[im 13/31  brain]
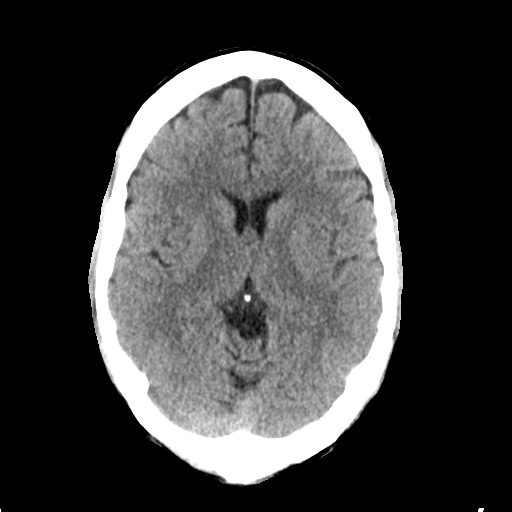
[im 16/31  brain]
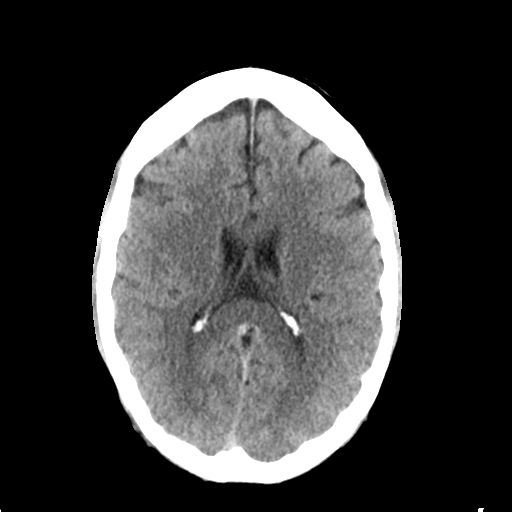
[im 16/31  bone]
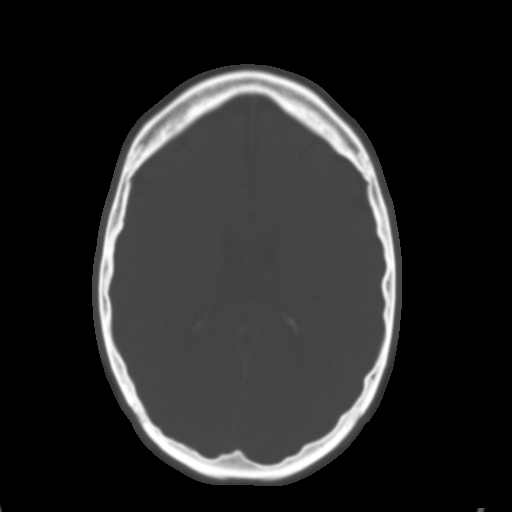
[im 18/31  brain]
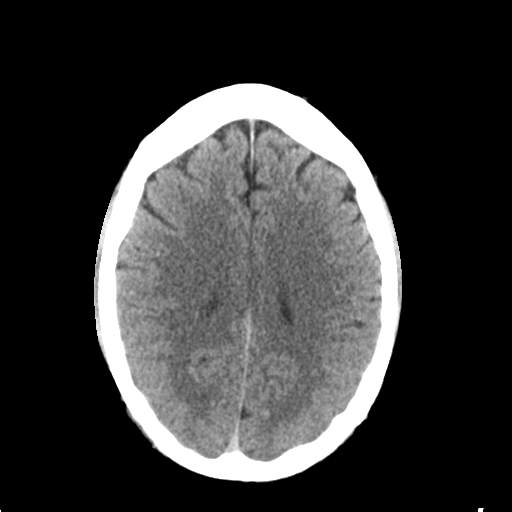
[im 22/31  brain]
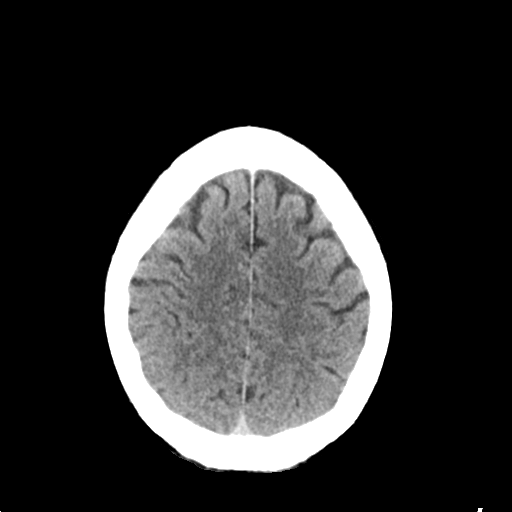
[im 24/31  brain]
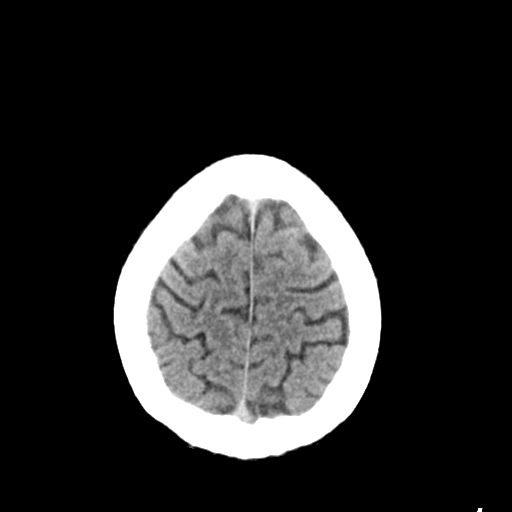
[im 28/31  brain]
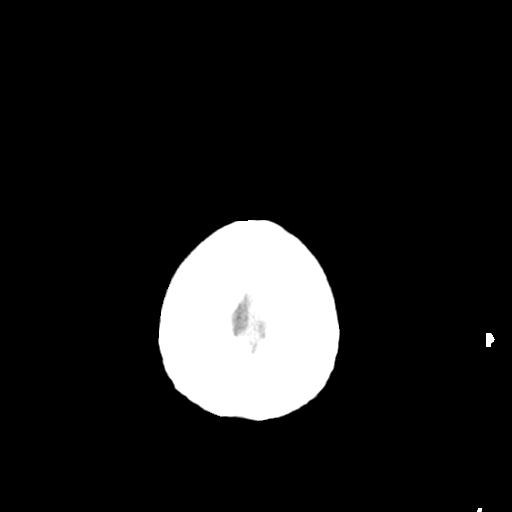
[im 28/31  bone]
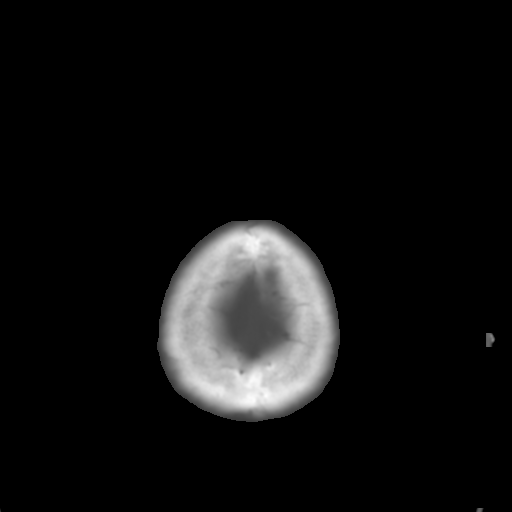

[Series 4: coronal soft tissue · coronal · 0.31mm/px · 3 of 62 slices shown]
[im 21/62  brain]
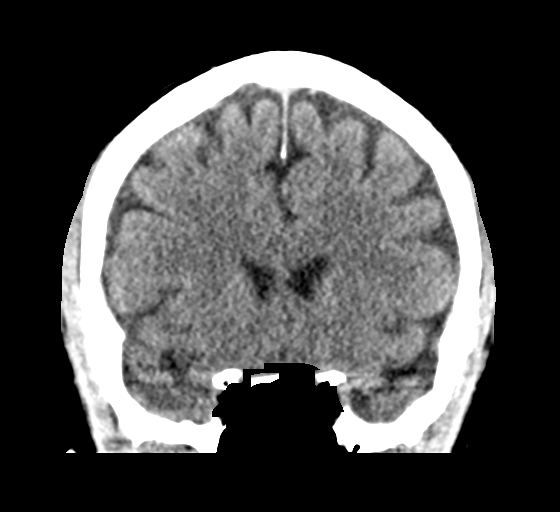
[im 28/62  brain]
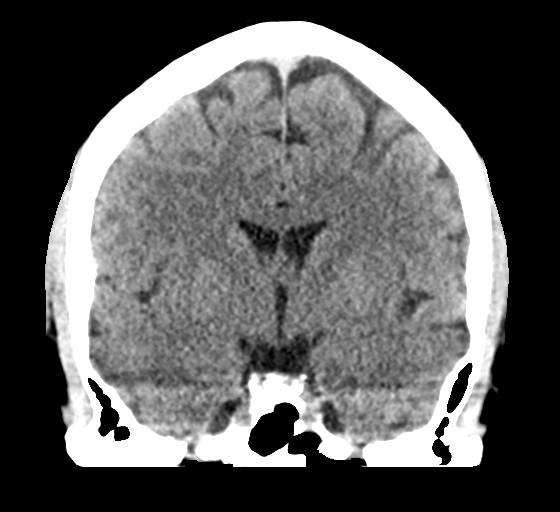
[im 34/62  brain]
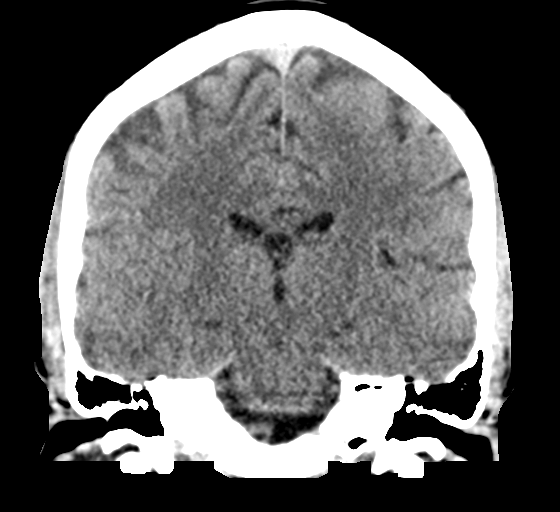

[Series 5: sagittal soft tissue · sagittal · 0.33mm/px · 3 of 52 slices shown]
[im 18/52  brain]
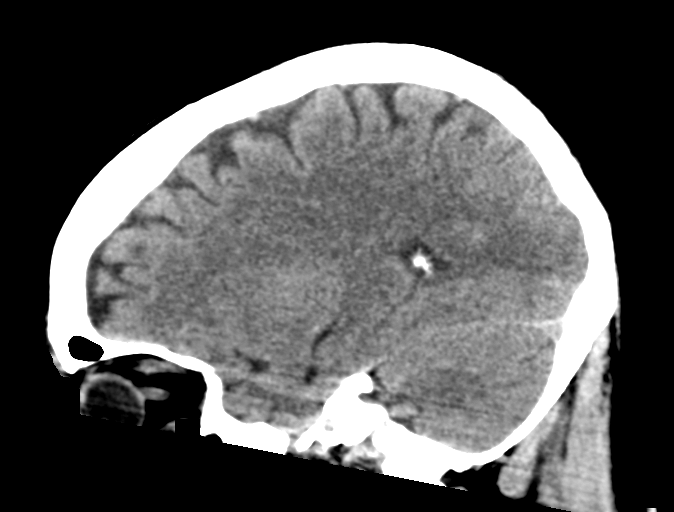
[im 26/52  brain]
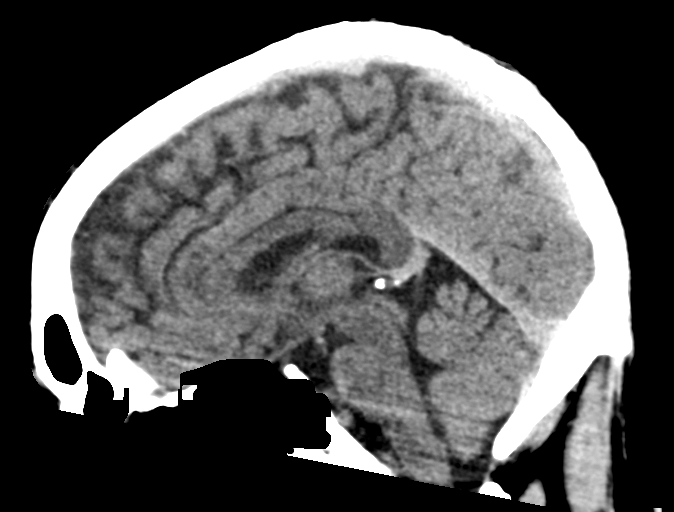
[im 35/52  brain]
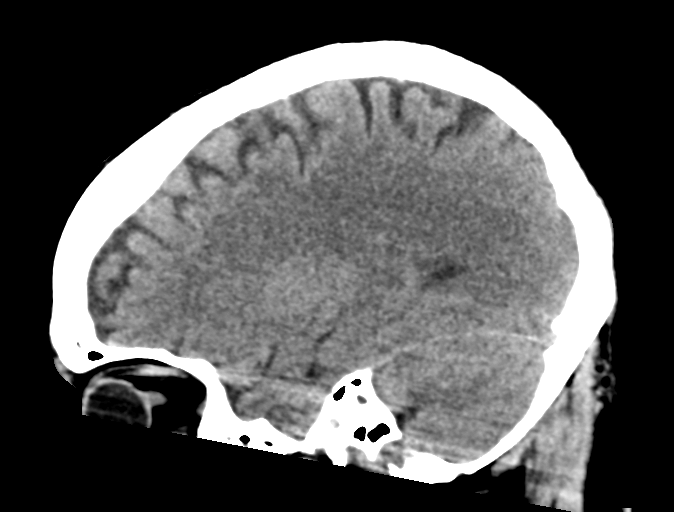

[15 of 47 positions shown; findings below may reference images not displayed]

FINDINGS: Brain: Ventricles are normal in size and configuration. All areas of
the brain demonstrate normal gray-white matter attenuation. There is
no mass, hemorrhage, edema or other evidence of acute parenchymal
abnormality. No extra-axial hemorrhage.

Vascular: No hyperdense vessel or unexpected calcification.

Skull: Normal. Negative for fracture or focal lesion.

Sinuses/Orbits: Visualized upper paranasal sinuses are clear.
Visualized upper periorbital and retro-orbital soft tissues are
unremarkable.

Other: Mastoid air cells are clear. External auditory canals appear
clear. Middle ear cavities appear clear.
IMPRESSION: Negative head CT.  No intracranial mass, hemorrhage or edema.

## 2018-08-20 DIAGNOSIS — E039 Hypothyroidism, unspecified: Secondary | ICD-10-CM | POA: Diagnosis not present

## 2018-08-20 DIAGNOSIS — E291 Testicular hypofunction: Secondary | ICD-10-CM | POA: Diagnosis not present

## 2018-08-20 DIAGNOSIS — I10 Essential (primary) hypertension: Secondary | ICD-10-CM | POA: Diagnosis not present

## 2018-10-28 DIAGNOSIS — E782 Mixed hyperlipidemia: Secondary | ICD-10-CM | POA: Diagnosis not present

## 2018-10-28 DIAGNOSIS — Z125 Encounter for screening for malignant neoplasm of prostate: Secondary | ICD-10-CM | POA: Diagnosis not present

## 2018-10-28 DIAGNOSIS — R32 Unspecified urinary incontinence: Secondary | ICD-10-CM | POA: Diagnosis not present

## 2019-01-06 DIAGNOSIS — M5126 Other intervertebral disc displacement, lumbar region: Secondary | ICD-10-CM | POA: Diagnosis not present

## 2019-01-06 DIAGNOSIS — M4316 Spondylolisthesis, lumbar region: Secondary | ICD-10-CM | POA: Diagnosis not present

## 2019-01-06 DIAGNOSIS — M48061 Spinal stenosis, lumbar region without neurogenic claudication: Secondary | ICD-10-CM | POA: Diagnosis not present

## 2019-01-06 DIAGNOSIS — M47816 Spondylosis without myelopathy or radiculopathy, lumbar region: Secondary | ICD-10-CM | POA: Diagnosis not present

## 2019-03-02 DIAGNOSIS — M961 Postlaminectomy syndrome, not elsewhere classified: Secondary | ICD-10-CM | POA: Diagnosis not present

## 2019-03-02 DIAGNOSIS — M48062 Spinal stenosis, lumbar region with neurogenic claudication: Secondary | ICD-10-CM | POA: Diagnosis not present

## 2019-03-02 DIAGNOSIS — G894 Chronic pain syndrome: Secondary | ICD-10-CM | POA: Diagnosis not present

## 2019-04-14 DIAGNOSIS — G894 Chronic pain syndrome: Secondary | ICD-10-CM | POA: Diagnosis not present

## 2019-04-29 DIAGNOSIS — M48062 Spinal stenosis, lumbar region with neurogenic claudication: Secondary | ICD-10-CM | POA: Diagnosis not present

## 2019-04-29 DIAGNOSIS — M961 Postlaminectomy syndrome, not elsewhere classified: Secondary | ICD-10-CM | POA: Diagnosis not present

## 2019-05-05 DIAGNOSIS — Z23 Encounter for immunization: Secondary | ICD-10-CM | POA: Diagnosis not present
